# Patient Record
Sex: Female | Born: 1985 | Race: Black or African American | Hispanic: No | Smoking: Never smoker
Health system: Southern US, Community
[De-identification: ages and names within clinical notes are randomized; demographics above are authoritative.]

## PROBLEM LIST (undated history)

## (undated) ENCOUNTER — Inpatient Hospital Stay (HOSPITAL_COMMUNITY): Payer: Self-pay

## (undated) DIAGNOSIS — K219 Gastro-esophageal reflux disease without esophagitis: Secondary | ICD-10-CM

## (undated) DIAGNOSIS — K469 Unspecified abdominal hernia without obstruction or gangrene: Secondary | ICD-10-CM

## (undated) DIAGNOSIS — G43909 Migraine, unspecified, not intractable, without status migrainosus: Secondary | ICD-10-CM

## (undated) DIAGNOSIS — O039 Complete or unspecified spontaneous abortion without complication: Secondary | ICD-10-CM

## (undated) DIAGNOSIS — IMO0002 Reserved for concepts with insufficient information to code with codable children: Secondary | ICD-10-CM

## (undated) DIAGNOSIS — D219 Benign neoplasm of connective and other soft tissue, unspecified: Secondary | ICD-10-CM

## (undated) HISTORY — DX: Reserved for concepts with insufficient information to code with codable children: IMO0002

## (undated) HISTORY — DX: Complete or unspecified spontaneous abortion without complication: O03.9

## (undated) HISTORY — PX: TONSILLECTOMY: SUR1361

---
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
## 2003-06-05 ENCOUNTER — Encounter: Admission: RE | Admit: 2003-06-05 | Discharge: 2003-07-04 | Payer: Self-pay | Admitting: Specialist

## 2007-12-08 ENCOUNTER — Other Ambulatory Visit: Admission: RE | Admit: 2007-12-08 | Discharge: 2007-12-08 | Payer: Self-pay | Admitting: Gynecology

## 2009-05-22 ENCOUNTER — Inpatient Hospital Stay (HOSPITAL_COMMUNITY): Admission: AD | Admit: 2009-05-22 | Discharge: 2009-05-22 | Payer: Self-pay | Admitting: Obstetrics and Gynecology

## 2009-05-27 ENCOUNTER — Inpatient Hospital Stay (HOSPITAL_COMMUNITY): Admission: AD | Admit: 2009-05-27 | Discharge: 2009-05-30 | Payer: Self-pay | Admitting: Obstetrics and Gynecology

## 2010-10-29 LAB — CBC
Hemoglobin: 9.5 g/dL — ABNORMAL LOW (ref 12.0–15.0)
Platelets: 226 10*3/uL (ref 150–400)
RBC: 3.1 MIL/uL — ABNORMAL LOW (ref 3.87–5.11)
RBC: 3.83 MIL/uL — ABNORMAL LOW (ref 3.87–5.11)
RDW: 15.3 % (ref 11.5–15.5)
WBC: 11.7 10*3/uL — ABNORMAL HIGH (ref 4.0–10.5)
WBC: 11.7 10*3/uL — ABNORMAL HIGH (ref 4.0–10.5)

## 2010-10-29 LAB — RPR: RPR Ser Ql: NONREACTIVE

## 2010-12-18 ENCOUNTER — Encounter (INDEPENDENT_AMBULATORY_CARE_PROVIDER_SITE_OTHER): Payer: Medicaid Other

## 2010-12-18 DIAGNOSIS — Z3480 Encounter for supervision of other normal pregnancy, unspecified trimester: Secondary | ICD-10-CM

## 2010-12-26 DIAGNOSIS — O039 Complete or unspecified spontaneous abortion without complication: Secondary | ICD-10-CM

## 2010-12-26 HISTORY — DX: Complete or unspecified spontaneous abortion without complication: O03.9

## 2010-12-29 ENCOUNTER — Ambulatory Visit: Payer: Medicaid Other | Admitting: Obstetrics and Gynecology

## 2010-12-30 ENCOUNTER — Ambulatory Visit: Payer: Medicaid Other | Admitting: Family Medicine

## 2011-01-06 ENCOUNTER — Ambulatory Visit (INDEPENDENT_AMBULATORY_CARE_PROVIDER_SITE_OTHER): Payer: Medicaid Other | Admitting: Family Medicine

## 2011-01-06 ENCOUNTER — Encounter: Payer: Medicaid Other | Admitting: Family Medicine

## 2011-01-06 DIAGNOSIS — O021 Missed abortion: Secondary | ICD-10-CM

## 2011-01-07 NOTE — Assessment & Plan Note (Signed)
Frances Cohen, Frances Cohen              ACCOUNT NO.:  [id number]  MEDICAL RECORD NO.:  [id number]           PATIENT TYPE:  LOCATION:  [facility]           FACILITY:  PHYSICIAN:  Tinnie Gens, MD             DATE OF BIRTH:  DATE OF SERVICE:                                 CLINIC NOTE  CHIEF COMPLAINT:  Followup SAB.  HISTORY OF PRESENT ILLNESS:  The patient is a 25 year old gravida 3, para 1-0-1-1 who had a miscarriage approximately 2 weeks ago.  She reported passing tissue back at home and she had bleeding for 6 days that has resolved.  She has never went to hospital for followup.  She stopped bleeding.  She feels like she has passed everything.  She had an abnormal-appearing sac, but no IUP here in the office.  She reports her mood is well.  She is feeling well.  No other issues.  PHYSICAL EXAMINATION:  VITALS:  As in the chart. GENERAL:  She is well developed and well nourished in no acute distress. ABDOMEN:  Soft, nontender, nondistended.  IMPRESSION:  Status post spontaneous abortion.  PLAN:  I had a lengthy discussion with her regarding causes of the likely outcome to pregnancy at this age, normal rate of miscarriage and normal healthy adult waiting to get pregnant for at least 3 months, and starting on birth control in a patient and Loestrin that she has been on previously.  She will restart this when she gets her cycle back.  If she does get her cycle back, she should start with that.  Resume prenatal vitamins with folic acid prior to achieving pregnancy.          ______________________________ Tinnie Gens, MD    TP/MEDQ  D:  01/06/2011  T:  01/07/2011  Job:  [id number]

## 2011-04-12 ENCOUNTER — Emergency Department: Payer: Self-pay | Admitting: Emergency Medicine

## 2011-04-29 ENCOUNTER — Other Ambulatory Visit (INDEPENDENT_AMBULATORY_CARE_PROVIDER_SITE_OTHER): Payer: Self-pay

## 2011-04-29 DIAGNOSIS — N912 Amenorrhea, unspecified: Secondary | ICD-10-CM

## 2011-04-29 NOTE — Progress Notes (Signed)
Patient is here today for a pregnancy confirmation.  She needs confirmation to be able to apply for insurance. Urine pregnancy test is positive.  Patients LMP is March 12, 2011.  She has a new ob intake appointment next week.

## 2011-05-04 ENCOUNTER — Ambulatory Visit: Payer: 59 | Admitting: Gynecology

## 2011-05-04 DIAGNOSIS — O3680X0 Pregnancy with inconclusive fetal viability, not applicable or unspecified: Secondary | ICD-10-CM

## 2011-05-04 DIAGNOSIS — Z3480 Encounter for supervision of other normal pregnancy, unspecified trimester: Secondary | ICD-10-CM

## 2011-05-04 LAB — OB RESULTS CONSOLE ABO/RH

## 2011-05-04 LAB — OB RESULTS CONSOLE RUBELLA ANTIBODY, IGM: Rubella: IMMUNE

## 2011-05-04 LAB — OB RESULTS CONSOLE ANTIBODY SCREEN: Antibody Screen: NEGATIVE

## 2011-05-04 LAB — OB RESULTS CONSOLE HEPATITIS B SURFACE ANTIGEN: Hepatitis B Surface Ag: UNDETERMINED

## 2011-05-19 ENCOUNTER — Telehealth: Payer: Self-pay | Admitting: *Deleted

## 2011-05-19 MED ORDER — ONDANSETRON HCL 4 MG PO TABS: 4.0000 mg | ORAL_TABLET | Freq: Three times a day (TID) | ORAL | Status: AC | PRN

## 2011-05-19 NOTE — Telephone Encounter (Signed)
Patient complains of increased nausea and vomiting in her first trimester.  She is unable to eat anything.  We will call in Zofran 4mg  for her and she will call back if her symptoms change or worsen.

## 2011-05-20 ENCOUNTER — Ambulatory Visit (HOSPITAL_COMMUNITY): Payer: 59

## 2011-06-09 ENCOUNTER — Ambulatory Visit (INDEPENDENT_AMBULATORY_CARE_PROVIDER_SITE_OTHER): Payer: 59 | Admitting: Obstetrics & Gynecology

## 2011-06-09 ENCOUNTER — Ambulatory Visit (HOSPITAL_COMMUNITY)
Admission: RE | Admit: 2011-06-09 | Discharge: 2011-06-09 | Disposition: A | Discharge status: Home / Self Care | Payer: 59 | Source: Ambulatory Visit | Attending: Obstetrics and Gynecology | Admitting: Obstetrics and Gynecology

## 2011-06-09 VITALS — BP 103/64 | Wt 140.0 lb

## 2011-06-09 DIAGNOSIS — Z9889 Other specified postprocedural states: Secondary | ICD-10-CM

## 2011-06-09 DIAGNOSIS — Z3490 Encounter for supervision of normal pregnancy, unspecified, unspecified trimester: Secondary | ICD-10-CM | POA: Insufficient documentation

## 2011-06-09 DIAGNOSIS — Z3689 Encounter for other specified antenatal screening: Secondary | ICD-10-CM | POA: Insufficient documentation

## 2011-06-09 DIAGNOSIS — Z3480 Encounter for supervision of other normal pregnancy, unspecified trimester: Secondary | ICD-10-CM

## 2011-06-09 DIAGNOSIS — Z98891 History of uterine scar from previous surgery: Secondary | ICD-10-CM | POA: Insufficient documentation

## 2011-06-09 NOTE — Progress Notes (Signed)
   Subjective:    BELICIA DIFATTA is a [id number] [redacted]w[redacted]d being seen today for her first obstetrical visit.  Her obstetrical history is significant for prior cesarean section for failure to progress.  Pregnancy history fully reviewed.  Patient reports no complaints.  Filed Vitals:   06/09/11 1400  BP: 103/64  Weight: 140 lb (63.504 kg)    HISTORY: OB History    Grav Para Term Preterm Abortions TAB SAB Ect Mult Living   4 1 1  2  1   1      # Outc Date GA Lbr Len/2nd Wgt Sex Del Anes PTL Lv   1 ABT 2/08 [redacted]w[redacted]d       No   2 TRM 11/10 [redacted]w[redacted]d  8lb3oz(3.714kg) M LTCS Spinal  Yes   3 SAB 5/12 [redacted]w[redacted]d          4 CUR              Past Medical History  Diagnosis Date  . SAB (spontaneous abortion) 12/2010    TWO  . Abnormal Pap smear 2003   Past Surgical History  Procedure Date  . Tonsillectomy   . Wisdom tooth extraction 2004     x4   Family History  Problem Relation Age of Onset  . Diabetes Mother   . Cancer Paternal Aunt     LUNG  . Cancer Paternal Grandmother 42    breast cancer  . Cancer Paternal Grandfather     prostate cancer     Exam    Uterine Size: 12 cm and size equals dates  Pelvic Exam:    Perineum: No Hemorrhoids   Vulva: normal   Vagina:  normal mucosa, normal discharge   pH: Not done   Cervix: normal   Adnexa: normal adnexa   Bony Pelvis: average  System: Breast:  normal appearance, no masses or tenderness   Skin: normal coloration and turgor, no rashes    Neurologic: normal   Extremities: normal strength, tone, and muscle mass   HEENT PERRLA and extra ocular movement intact   Mouth/Teeth mucous membranes moist, pharynx normal without lesions   Neck supple and no masses   Cardiovascular: regular rate and rhythm   Respiratory:  appears well, vitals normal, no respiratory distress, acyanotic, normal RR, neck free of mass or lymphadenopathy, chest clear, no wheezing, crepitations, rhonchi, normal symmetric air entry   Abdomen: soft, non-tender; bowel  sounds normal; no masses,  no organomegaly   Urinary: urethral meatus normal      Assessment:    Pregnancy: [id number] Patient Active Problem List  Diagnoses  . Supervision of normal pregnancy  . Previous cesarean section        Plan:     Continue prenatal vitamins. Problem list reviewed and updated. Genetic Screening discussed First Screen and Quad Screen: desires quad screen, will draw at next visit.  Ultrasound discussed; fetal survey: ordered.  Follow up in 4 weeks.   ANYANWU,UGONNA A 06/09/2011

## 2011-06-09 NOTE — Patient Instructions (Signed)
Pregnancy - Second Trimester The second trimester of pregnancy (3 to 6 months) is a period of rapid growth for you and your baby. At the end of the sixth month, your baby is about 9 inches long and weighs 1 1/2 pounds. You will begin to feel the baby move between 18 and 20 weeks of the pregnancy. This is called quickening. Weight gain is faster. A clear fluid (colostrum) may leak out of your breasts. You may feel small contractions of the womb (uterus). This is known as false labor or Braxton-Hicks contractions. This is like a practice for labor when the baby is ready to be born. Usually, the problems with morning sickness have usually passed by the end of your first trimester. Some women develop small dark blotches (called cholasma, mask of pregnancy) on their face that usually goes away after the baby is born. Exposure to the sun makes the blotches worse. Acne may also develop in some pregnant women and pregnant women who have acne, may find that it goes away. PRENATAL EXAMS  Blood work may continue to be done during prenatal exams. These tests are done to check on your health and the probable health of your baby. Blood work is used to follow your blood levels (hemoglobin). Anemia (low hemoglobin) is common during pregnancy. Iron and vitamins are given to help prevent this. You will also be checked for diabetes between 24 and 28 weeks of the pregnancy. Some of the previous blood tests may be repeated.   The size of the uterus is measured during each visit. This is to make sure that the baby is continuing to grow properly according to the dates of the pregnancy.   Your blood pressure is checked every prenatal visit. This is to make sure you are not getting toxemia.   Your urine is checked to make sure you do not have an infection, diabetes or protein in the urine.   Your weight is checked often to make sure gains are happening at the suggested rate. This is to ensure that both you and your baby are  growing normally.   Sometimes, an ultrasound is performed to confirm the proper growth and development of the baby. This is a test which bounces harmless sound waves off the baby so your caregiver can more accurately determine due dates.  Sometimes, a specialized test is done on the amniotic fluid surrounding the baby. This test is called an amniocentesis. The amniotic fluid is obtained by sticking a needle into the belly (abdomen). This is done to check the chromosomes in instances where there is a concern about possible genetic problems with the baby. It is also sometimes done near the end of pregnancy if an early delivery is required. In this case, it is done to help make sure the baby's lungs are mature enough for the baby to live outside of the womb. CHANGES OCCURING IN THE SECOND TRIMESTER OF PREGNANCY Your body goes through many changes during pregnancy. They vary from person to person. Talk to your caregiver about changes you notice that you are concerned about.  During the second trimester, you will likely have an increase in your appetite. It is normal to have cravings for certain foods. This varies from person to person and pregnancy to pregnancy.   Your lower abdomen will begin to bulge.   You may have to urinate more often because the uterus and baby are pressing on your bladder. It is also common to get more bladder infections during pregnancy (  pain with urination). You can help this by drinking lots of fluids and emptying your bladder before and after intercourse.   You may begin to get stretch marks on your hips, abdomen, and breasts. These are normal changes in the body during pregnancy. There are no exercises or medications to take that prevent this change.   You may begin to develop swollen and bulging veins (varicose veins) in your legs. Wearing support hose, elevating your feet for 15 minutes, 3 to 4 times a day and limiting salt in your diet helps lessen the problem.    Heartburn may develop as the uterus grows and pushes up against the stomach. Antacids recommended by your caregiver helps with this problem. Also, eating smaller meals 4 to 5 times a day helps.   Constipation can be treated with a stool softener or adding bulk to your diet. Drinking lots of fluids, vegetables, fruits, and whole grains are helpful.   Exercising is also helpful. If you have been very active up until your pregnancy, most of these activities can be continued during your pregnancy. If you have been less active, it is helpful to start an exercise program such as walking.   Hemorrhoids (varicose veins in the rectum) may develop at the end of the second trimester. Warm sitz baths and hemorrhoid cream recommended by your caregiver helps hemorrhoid problems.   Backaches may develop during this time of your pregnancy. Avoid heavy lifting, wear low heal shoes and practice good posture to help with backache problems.   Some pregnant women develop tingling and numbness of their hand and fingers because of swelling and tightening of ligaments in the wrist (carpel tunnel syndrome). This goes away after the baby is born.   As your breasts enlarge, you may have to get a bigger bra. Get a comfortable, cotton, support bra. Do not get a nursing bra until the last month of the pregnancy if you will be nursing the baby.   You may get a dark line from your belly button to the pubic area called the linea nigra.   You may develop rosy cheeks because of increase blood flow to the face.   You may develop spider looking lines of the face, neck, arms and chest. These go away after the baby is born.  HOME CARE INSTRUCTIONS   It is extremely important to avoid all smoking, herbs, alcohol, and unprescribed drugs during your pregnancy. These chemicals affect the formation and growth of the baby. Avoid these chemicals throughout the pregnancy to ensure the delivery of a healthy infant.   Most of your home  care instructions are the same as suggested for the first trimester of your pregnancy. Keep your caregiver's appointments. Follow your caregiver's instructions regarding medication use, exercise and diet.   During pregnancy, you are providing food for you and your baby. Continue to eat regular, well-balanced meals. Choose foods such as meat, fish, milk and other low fat dairy products, vegetables, fruits, and whole-grain breads and cereals. Your caregiver will tell you of the ideal weight gain.   A physical sexual relationship may be continued up until near the end of pregnancy if there are no other problems. Problems could include early (premature) leaking of amniotic fluid from the membranes, vaginal bleeding, abdominal pain, or other medical or pregnancy problems.   Exercise regularly if there are no restrictions. Check with your caregiver if you are unsure of the safety of some of your exercises. The greatest weight gain will occur in the   last 2 trimesters of pregnancy. Exercise will help you:   Control your weight.   Get you in shape for labor and delivery.   Lose weight after you have the baby.   Wear a good support or jogging bra for breast tenderness during pregnancy. This may help if worn during sleep. Pads or tissues may be used in the bra if you are leaking colostrum.   Do not use hot tubs, steam rooms or saunas throughout the pregnancy.   Wear your seat belt at all times when driving. This protects you and your baby if you are in an accident.   Avoid raw meat, uncooked cheese, cat litter boxes and soil used by cats. These carry germs that can cause birth defects in the baby.   The second trimester is also a good time to visit your dentist for your dental health if this has not been done yet. Getting your teeth cleaned is OK. Use a soft toothbrush. Brush gently during pregnancy.   It is easier to loose urine during pregnancy. Tightening up and strengthening the pelvic muscles will  help with this problem. Practice stopping your urination while you are going to the bathroom. These are the same muscles you need to strengthen. It is also the muscles you would use as if you were trying to stop from passing gas. You can practice tightening these muscles up 10 times a set and repeating this about 3 times per day. Once you know what muscles to tighten up, do not perform these exercises during urination. It is more likely to contribute to an infection by backing up the urine.   Ask for help if you have financial, counseling or nutritional needs during pregnancy. Your caregiver will be able to offer counseling for these needs as well as refer you for other special needs.   Your skin may become oily. If so, wash your face with mild soap, use non-greasy moisturizer and oil or cream based makeup.  MEDICATIONS AND DRUG USE IN PREGNANCY  Take prenatal vitamins as directed. The vitamin should contain 1 milligram of folic acid. Keep all vitamins out of reach of children. Only a couple vitamins or tablets containing iron may be fatal to a baby or young child when ingested.   Avoid use of all medications, including herbs, over-the-counter medications, not prescribed or suggested by your caregiver. Only take over-the-counter or prescription medicines for pain, discomfort, or fever as directed by your caregiver. Do not use aspirin.   Let your caregiver also know about herbs you may be using.   Alcohol is related to a number of birth defects. This includes fetal alcohol syndrome. All alcohol, in any form, should be avoided completely. Smoking will cause low birth rate and premature babies.   Street or illegal drugs are very harmful to the baby. They are absolutely forbidden. A baby born to an addicted mother will be addicted at birth. The baby will go through the same withdrawal an adult does.  SEEK MEDICAL CARE IF:  You have any concerns or worries during your pregnancy. It is better to call with  your questions if you feel they cannot wait, rather than worry about them. SEEK IMMEDIATE MEDICAL CARE IF:   An unexplained oral temperature above 102 F (38.9 C) develops, or as your caregiver suggests.   You have leaking of fluid from the vagina (birth canal). If leaking membranes are suspected, take your temperature and tell your caregiver of this when you call.   There   is vaginal spotting, bleeding, or passing clots. Tell your caregiver of the amount and how many pads are used. Light spotting in pregnancy is common, especially following intercourse.   You develop a bad smelling vaginal discharge with a change in the color from clear to white.   You continue to feel sick to your stomach (nauseated) and have no relief from remedies suggested. You vomit blood or coffee ground-like materials.   You lose more than 2 pounds of weight or gain more than 2 pounds of weight over 1 week, or as suggested by your caregiver.   You notice swelling of your face, hands, feet, or legs.   You get exposed to German measles and have never had them.   You are exposed to fifth disease or chickenpox.   You develop belly (abdominal) pain. Round ligament discomfort is a common non-cancerous (benign) cause of abdominal pain in pregnancy. Your caregiver still must evaluate you.   You develop a bad headache that does not go away.   You develop fever, diarrhea, pain with urination, or shortness of breath.   You develop visual problems, blurry, or double vision.   You fall or are in a car accident or any kind of trauma.   There is mental or physical violence at home.  Document Released: 07/07/2001 Document Revised: 03/25/2011 Document Reviewed: 01/09/2009 [employer] Patient Information 2012 [employer]. 

## 2011-07-01 ENCOUNTER — Telehealth: Payer: Self-pay | Admitting: *Deleted

## 2011-07-01 ENCOUNTER — Telehealth: Payer: Self-pay

## 2011-07-01 DIAGNOSIS — O219 Vomiting of pregnancy, unspecified: Secondary | ICD-10-CM

## 2011-07-01 MED ORDER — ONDANSETRON HCL 4 MG PO TABS: 4.0000 mg | ORAL_TABLET | Freq: Three times a day (TID) | ORAL | Status: AC | PRN

## 2011-07-01 MED ORDER — ONDANSETRON HCL 4 MG PO TABS: 4.0000 mg | ORAL_TABLET | Freq: Three times a day (TID) | ORAL | Status: DC | PRN

## 2011-07-01 NOTE — Telephone Encounter (Signed)
Patient needs meds to different pharmacy.

## 2011-07-01 NOTE — Telephone Encounter (Signed)
Left message for patient that her prescription was called in.

## 2011-07-01 NOTE — Telephone Encounter (Signed)
PATIENT IS REALLY SICK AND NEEDS HER PRESCRIPTIONS CALLED IN ZOFRAN TO [facility] PH: [phone].

## 2011-07-07 ENCOUNTER — Ambulatory Visit (INDEPENDENT_AMBULATORY_CARE_PROVIDER_SITE_OTHER): Payer: 59 | Admitting: Obstetrics and Gynecology

## 2011-07-07 VITALS — BP 91/65 | Ht 67.0 in | Wt 142.0 lb

## 2011-07-07 DIAGNOSIS — Z3480 Encounter for supervision of other normal pregnancy, unspecified trimester: Secondary | ICD-10-CM

## 2011-07-07 NOTE — Progress Notes (Signed)
Patient is here today for routine prenatal check.  She is doing well, only having to take the Zofran once daily to control her nausea and vomiting.  Her appetite has returned.

## 2011-07-07 NOTE — Progress Notes (Signed)
Doing well; working FT at [employer]. Discussed prior C/S birth after failed VAVD with 8# baby. Gave VBAC consent and will discuss more next visit but probably wanting Rpt C/S. Has [place] scheduled.

## 2011-07-07 NOTE — Patient Instructions (Signed)
Vaginal Birth After Cesarean Delivery Vaginal birth after Cesarean delivery (VBAC) is giving birth vaginally after previously delivering a baby by a cesarean. In the past, if a woman had a Cesarean delivery, all births afterwards would be done by Cesarean delivery. This is no longer true. It can be safe for the mother to try a vaginal delivery after having a Cesarean. The final decision to have a VBAC or repeat Cesarean delivery should be between the patient and her caregiver. The risks and benefits can be discussed relative to the reason for, and the type of the previous Cesarean delivery. WOMEN WHO PLAN TO HAVE A VBAC SHOULD CHECK WITH THEIR DOCTOR TO BE SURE THAT:  The previous Cesarean was done with a low transverse uterine incision (not a vertical classical incision).   The birth canal is big enough for the baby.   There were no other operations on the uterus.   They will have an electronic fetal monitor (EFM) on at all times during labor.   An operating room would be available and ready in case an emergency Cesarean is needed.   A doctor and surgical nursing staff would be available at all times during labor to be ready to do an emergency Cesarean if necessary.   An anesthesiologist would be present in case an emergency Cesarean is needed.   The nursery is prepared and has adequate personnel and necessary equipment available to care for the baby in case of an emergency Cesarean.  BENEFITS OF VBAC:  Shorter stay in the hospital.   Lower delivery, nursery and hospital costs.   Less blood loss and need for blood transfusions.   Less fever and discomfort from major surgery.   Lower risk of blood clots.   Lower risk of infection.   Shorter recovery after going home.   Lower risk of other surgical complications, such as opening of the incision or hernia in the incision.   Decreased risk of injury to other organs.   Decreased risk for having to remove the uterus (hysterectomy).     Decreased risk for the placenta to completely or partially cover the opening of the uterus (placenta previa) with a future pregnancy.   Ability to have a larger family if desired.  RISKS OF A VBAC:  Rupture of the uterus.   Having to remove the uterus (hysterectomy) if it ruptures.   All the complications of major surgery and/or injury to other organs.   Excessive bleeding, blood clots and infection.   Lower Apgar scores (method to evaluate the newborn based on appearance, pulse, grimace, activity, and respiration) and more risks to the baby.   There is a higher risk of uterine rupture if you induce or augment labor.   There is a higher risk of uterine rupture if you use medications to ripen the cervix.  VBAC SHOULD NOT BE DONE IF:  The previous Cesarean was done with a vertical (classical) or T-shaped incision, or you do not know what kind of an incision was made.   You had a ruptured uterus.   You had surgery on your uterus.   You have medical or obstetrical problems.   There are problems with the baby.   There were two previous Cesarean deliveries and no vaginal deliveries.  OTHER FACTS TO KNOW ABOUT VBAC:  It is safe to have an epidural anesthetic with VBAC.   It is safe to turn the baby from a breech position (attempt an external cephalic version).   It is  safe to try a VBAC with twins.   Pregnancies later than 40 weeks have not been successful with VBAC.   There is an increased failure rate of a VBAC in obese pregnant women.   There is an increased failure rate with VABC if the baby weighs 8.8 pounds (4000 grams) or more.   There is an increased failure rate if the time between the Cesarean and VBAC is less than 19 months.   There is an increased failure rate if pre-eclampsia is present (high blood pressure, protein in the urine and swelling of face and extremities).   VBAC is very successful if there was a previous vaginal birth.   VBAC is very successful  when the labor starts spontaneously before the due date.   Delivery of VBAC is similar to having a normal spontaneous vaginal delivery.  It is important to discuss VBAC with your caregiver early in the pregnancy so you can understand the risks, benefits and options. It will give you time to decide what is best in your particular case relevant to the reason for your previous Cesarean delivery. It should be understood that medical changes in the mother or pregnancy may occur during the pregnancy, which make it necessary to change you or your caregiver's initial decision. The counseling, concerns and decisions should be documented in the medical record and signed by all parties. Document Released: 01/03/2007 Document Revised: 03/25/2011 Document Reviewed: 08/24/2008 [facility] Patient Information 2012 [place], [place].Pregnancy - Second Trimester The second trimester of pregnancy (3 to 6 months) is a period of rapid growth for you and your baby. At the end of the sixth month, your baby is about 9 inches long and weighs 1 1/2 pounds. You will begin to feel the baby move between 18 and 20 weeks of the pregnancy. This is called quickening. Weight gain is faster. A clear fluid (colostrum) may leak out of your breasts. You may feel small contractions of the womb (uterus). This is known as false labor or Braxton-Hicks contractions. This is like a practice for labor when the baby is ready to be born. Usually, the problems with morning sickness have usually passed by the end of your first trimester. Some women develop small dark blotches (called cholasma, mask of pregnancy) on their face that usually goes away after the baby is born. Exposure to the sun makes the blotches worse. Acne may also develop in some pregnant women and pregnant women who have acne, may find that it goes away. PRENATAL EXAMS  Blood work may continue to be done during prenatal exams. These tests are done to check on your health and the probable  health of your baby. Blood work is used to follow your blood levels (hemoglobin). Anemia (low hemoglobin) is common during pregnancy. Iron and vitamins are given to help prevent this. You will also be checked for diabetes between 24 and 28 weeks of the pregnancy. Some of the previous blood tests may be repeated.   The size of the uterus is measured during each visit. This is to make sure that the baby is continuing to grow properly according to the dates of the pregnancy.   Your blood pressure is checked every prenatal visit. This is to make sure you are not getting toxemia.   Your urine is checked to make sure you do not have an infection, diabetes or protein in the urine.   Your weight is checked often to make sure gains are happening at the suggested rate. This is to  ensure that both you and your baby are growing normally.   Sometimes, an ultrasound is performed to confirm the proper growth and development of the baby. This is a test which bounces harmless sound waves off the baby so your caregiver can more accurately determine due dates.  Sometimes, a specialized test is done on the amniotic fluid surrounding the baby. This test is called an amniocentesis. The amniotic fluid is obtained by sticking a needle into the belly (abdomen). This is done to check the chromosomes in instances where there is a concern about possible genetic problems with the baby. It is also sometimes done near the end of pregnancy if an early delivery is required. In this case, it is done to help make sure the baby's lungs are mature enough for the baby to live outside of the womb. CHANGES OCCURING IN THE SECOND TRIMESTER OF PREGNANCY Your body goes through many changes during pregnancy. They vary from person to person. Talk to your caregiver about changes you notice that you are concerned about.  During the second trimester, you will likely have an increase in your appetite. It is normal to have cravings for certain foods.  This varies from person to person and pregnancy to pregnancy.   Your lower abdomen will begin to bulge.   You may have to urinate more often because the uterus and baby are pressing on your bladder. It is also common to get more bladder infections during pregnancy (pain with urination). You can help this by drinking lots of fluids and emptying your bladder before and after intercourse.   You may begin to get stretch marks on your hips, abdomen, and breasts. These are normal changes in the body during pregnancy. There are no exercises or medications to take that prevent this change.   You may begin to develop swollen and bulging veins (varicose veins) in your legs. Wearing support hose, elevating your feet for 15 minutes, 3 to 4 times a day and limiting salt in your diet helps lessen the problem.   Heartburn may develop as the uterus grows and pushes up against the stomach. Antacids recommended by your caregiver helps with this problem. Also, eating smaller meals 4 to 5 times a day helps.   Constipation can be treated with a stool softener or adding bulk to your diet. Drinking lots of fluids, vegetables, fruits, and whole grains are helpful.   Exercising is also helpful. If you have been very active up until your pregnancy, most of these activities can be continued during your pregnancy. If you have been less active, it is helpful to start an exercise program such as walking.   Hemorrhoids (varicose veins in the rectum) may develop at the end of the second trimester. Warm sitz baths and hemorrhoid cream recommended by your caregiver helps hemorrhoid problems.   Backaches may develop during this time of your pregnancy. Avoid heavy lifting, wear low heal shoes and practice good posture to help with backache problems.   Some pregnant women develop tingling and numbness of their hand and fingers because of swelling and tightening of ligaments in the wrist (carpel tunnel syndrome). This goes away after  the baby is born.   As your breasts enlarge, you may have to get a bigger bra. Get a comfortable, cotton, support bra. Do not get a nursing bra until the last month of the pregnancy if you will be nursing the baby.   You may get a dark line from your belly button to the  pubic area called the linea nigra.   You may develop rosy cheeks because of increase blood flow to the face.   You may develop spider looking lines of the face, neck, arms and chest. These go away after the baby is born.  HOME CARE INSTRUCTIONS   It is extremely important to avoid all smoking, herbs, alcohol, and unprescribed drugs during your pregnancy. These chemicals affect the formation and growth of the baby. Avoid these chemicals throughout the pregnancy to ensure the delivery of a healthy infant.   Most of your home care instructions are the same as suggested for the first trimester of your pregnancy. Keep your caregiver's appointments. Follow your caregiver's instructions regarding medication use, exercise and diet.   During pregnancy, you are providing food for you and your baby. Continue to eat regular, well-balanced meals. Choose foods such as meat, fish, milk and other low fat dairy products, vegetables, fruits, and whole-grain breads and cereals. Your caregiver will tell you of the ideal weight gain.   A physical sexual relationship may be continued up until near the end of pregnancy if there are no other problems. Problems could include early (premature) leaking of amniotic fluid from the membranes, vaginal bleeding, abdominal pain, or other medical or pregnancy problems.   Exercise regularly if there are no restrictions. Check with your caregiver if you are unsure of the safety of some of your exercises. The greatest weight gain will occur in the last 2 trimesters of pregnancy. Exercise will help you:   Control your weight.   Get you in shape for labor and delivery.   Lose weight after you have the baby.    Wear a good support or jogging bra for breast tenderness during pregnancy. This may help if worn during sleep. Pads or tissues may be used in the bra if you are leaking colostrum.   Do not use hot tubs, steam rooms or saunas throughout the pregnancy.   Wear your seat belt at all times when driving. This protects you and your baby if you are in an accident.   Avoid raw meat, uncooked cheese, cat litter boxes and soil used by cats. These carry germs that can cause birth defects in the baby.   The second trimester is also a good time to visit your dentist for your dental health if this has not been done yet. Getting your teeth cleaned is OK. Use a soft toothbrush. Brush gently during pregnancy.   It is easier to loose urine during pregnancy. Tightening up and strengthening the pelvic muscles will help with this problem. Practice stopping your urination while you are going to the bathroom. These are the same muscles you need to strengthen. It is also the muscles you would use as if you were trying to stop from passing gas. You can practice tightening these muscles up 10 times a set and repeating this about 3 times per day. Once you know what muscles to tighten up, do not perform these exercises during urination. It is more likely to contribute to an infection by backing up the urine.   Ask for help if you have financial, counseling or nutritional needs during pregnancy. Your caregiver will be able to offer counseling for these needs as well as refer you for other special needs.   Your skin may become oily. If so, wash your face with mild soap, use non-greasy moisturizer and oil or cream based makeup.  MEDICATIONS AND DRUG USE IN PREGNANCY  Take prenatal vitamins as  directed. The vitamin should contain 1 milligram of folic acid. Keep all vitamins out of reach of children. Only a couple vitamins or tablets containing iron may be fatal to a baby or young child when ingested.   Avoid use of all  medications, including herbs, over-the-counter medications, not prescribed or suggested by your caregiver. Only take over-the-counter or prescription medicines for pain, discomfort, or fever as directed by your caregiver. Do not use aspirin.   Let your caregiver also know about herbs you may be using.   Alcohol is related to a number of birth defects. This includes fetal alcohol syndrome. All alcohol, in any form, should be avoided completely. Smoking will cause low birth rate and premature babies.   Street or illegal drugs are very harmful to the baby. They are absolutely forbidden. A baby born to an addicted mother will be addicted at birth. The baby will go through the same withdrawal an adult does.  SEEK MEDICAL CARE IF:  You have any concerns or worries during your pregnancy. It is better to call with your questions if you feel they cannot wait, rather than worry about them. SEEK IMMEDIATE MEDICAL CARE IF:   An unexplained oral temperature above 102 F (38.9 C) develops, or as your caregiver suggests.   You have leaking of fluid from the vagina (birth canal). If leaking membranes are suspected, take your temperature and tell your caregiver of this when you call.   There is vaginal spotting, bleeding, or passing clots. Tell your caregiver of the amount and how many pads are used. Light spotting in pregnancy is common, especially following intercourse.   You develop a bad smelling vaginal discharge with a change in the color from clear to white.   You continue to feel sick to your stomach (nauseated) and have no relief from remedies suggested. You vomit blood or coffee ground-like materials.   You lose more than 2 pounds of weight or gain more than 2 pounds of weight over 1 week, or as suggested by your caregiver.   You notice swelling of your face, hands, feet, or legs.   You get exposed to [place] measles and have never had them.   You are exposed to fifth disease or chickenpox.    You develop belly (abdominal) pain. Round ligament discomfort is a common non-cancerous (benign) cause of abdominal pain in pregnancy. Your caregiver still must evaluate you.   You develop a bad headache that does not go away.   You develop fever, diarrhea, pain with urination, or shortness of breath.   You develop visual problems, blurry, or double vision.   You fall or are in a car accident or any kind of trauma.   There is mental or physical violence at home.  Document Released: 07/07/2001 Document Revised: 03/25/2011 Document Reviewed: 01/09/2009 [facility] Patient Information 2012 [place], [place].

## 2011-07-22 ENCOUNTER — Ambulatory Visit (HOSPITAL_COMMUNITY)
Admission: RE | Admit: 2011-07-22 | Discharge: 2011-07-22 | Disposition: A | Discharge status: Home / Self Care | Payer: 59 | Source: Ambulatory Visit | Attending: Obstetrics & Gynecology | Admitting: Obstetrics & Gynecology

## 2011-07-22 ENCOUNTER — Encounter: Payer: Self-pay | Admitting: Obstetrics & Gynecology

## 2011-07-22 DIAGNOSIS — Z3490 Encounter for supervision of normal pregnancy, unspecified, unspecified trimester: Secondary | ICD-10-CM

## 2011-07-22 DIAGNOSIS — O358XX0 Maternal care for other (suspected) fetal abnormality and damage, not applicable or unspecified: Secondary | ICD-10-CM | POA: Insufficient documentation

## 2011-07-22 DIAGNOSIS — O34219 Maternal care for unspecified type scar from previous cesarean delivery: Secondary | ICD-10-CM | POA: Insufficient documentation

## 2011-07-22 DIAGNOSIS — Z363 Encounter for antenatal screening for malformations: Secondary | ICD-10-CM | POA: Insufficient documentation

## 2011-07-22 DIAGNOSIS — Z1389 Encounter for screening for other disorder: Secondary | ICD-10-CM | POA: Insufficient documentation

## 2011-07-22 NOTE — Progress Notes (Signed)
Will do on next visit.

## 2011-07-30 ENCOUNTER — Ambulatory Visit (HOSPITAL_COMMUNITY): Payer: 59

## 2011-08-05 ENCOUNTER — Other Ambulatory Visit: Payer: Self-pay | Admitting: Obstetrics and Gynecology

## 2011-08-05 ENCOUNTER — Ambulatory Visit (INDEPENDENT_AMBULATORY_CARE_PROVIDER_SITE_OTHER): Payer: 59 | Admitting: Obstetrics and Gynecology

## 2011-08-05 DIAGNOSIS — Z9889 Other specified postprocedural states: Secondary | ICD-10-CM

## 2011-08-05 DIAGNOSIS — Z3490 Encounter for supervision of normal pregnancy, unspecified, unspecified trimester: Secondary | ICD-10-CM

## 2011-08-05 DIAGNOSIS — Z98891 History of uterine scar from previous surgery: Secondary | ICD-10-CM

## 2011-08-05 DIAGNOSIS — Z3480 Encounter for supervision of other normal pregnancy, unspecified trimester: Secondary | ICD-10-CM

## 2011-08-05 NOTE — Progress Notes (Signed)
Patient doing well without complaints. Patient never had quad screen collected.

## 2011-08-10 ENCOUNTER — Telehealth: Payer: Self-pay | Admitting: *Deleted

## 2011-08-11 LAB — AFP, QUAD SCREEN
AFP: 115 IU/mL
Age Alone: 1:899 {titer}
Curr Gest Age: 20.6 wks.days
Down Syndrome Scr Risk Est: 1:15300 {titer}
HCG, Total: 19411 m[IU]/mL
INH: 222.7 pg/mL
Interpretation-AFP: NEGATIVE
MoM for AFP: 1.73
MoM for INH: 1.29
MoM for hCG: 1.47
Open Spina bifida: NEGATIVE
Osb Risk: 1:3000 {titer}
Tri 18 Scr Risk Est: NEGATIVE
Trisomy 18 (Edward) Syndrome Interp.: 1:95100 {titer}
uE3 Mom: 0.83
uE3 Value: 1.3 ng/mL

## 2011-08-12 LAB — ALPHA FETOPROTEIN, MATERNAL
AFP: 101 IU/mL
Open Spina bifida: NEGATIVE
Osb Risk: 1:5330 {titer}

## 2011-08-13 ENCOUNTER — Encounter: Payer: Self-pay | Admitting: Obstetrics and Gynecology

## 2011-08-20 ENCOUNTER — Ambulatory Visit (INDEPENDENT_AMBULATORY_CARE_PROVIDER_SITE_OTHER): Payer: 59 | Admitting: Obstetrics & Gynecology

## 2011-08-20 VITALS — BP 105/63 | Wt 152.0 lb

## 2011-08-20 DIAGNOSIS — Z3490 Encounter for supervision of normal pregnancy, unspecified, unspecified trimester: Secondary | ICD-10-CM

## 2011-08-20 DIAGNOSIS — Z3480 Encounter for supervision of other normal pregnancy, unspecified trimester: Secondary | ICD-10-CM

## 2011-08-20 LAB — POCT URINALYSIS DIPSTICK
Ketones, UA: NEGATIVE
Leukocytes, UA: NEGATIVE
Urobilinogen, UA: 0.2
pH, UA: 7.5

## 2011-08-20 NOTE — Progress Notes (Signed)
She is here today for an unscheduled visit because she had what sound like BH ctxs yesterday. They have resolved today. She denies VB or ROM and reports good FM. I have given her reassurance.

## 2011-08-27 NOTE — Telephone Encounter (Signed)
Patient needs a letter to assist with not having to park so far away from the entrance to her work. They have changed her hours to evening hours.

## 2011-09-03 ENCOUNTER — Encounter: Payer: 59 | Admitting: Obstetrics & Gynecology

## 2011-09-08 ENCOUNTER — Encounter: Payer: 59 | Admitting: Family Medicine

## 2011-09-09 ENCOUNTER — Encounter: Payer: 59 | Admitting: Obstetrics & Gynecology

## 2011-09-09 ENCOUNTER — Ambulatory Visit (INDEPENDENT_AMBULATORY_CARE_PROVIDER_SITE_OTHER): Payer: 59 | Admitting: Obstetrics & Gynecology

## 2011-09-09 VITALS — BP 102/66 | Wt 153.0 lb

## 2011-09-09 DIAGNOSIS — Z3480 Encounter for supervision of other normal pregnancy, unspecified trimester: Secondary | ICD-10-CM

## 2011-09-09 DIAGNOSIS — Z3490 Encounter for supervision of normal pregnancy, unspecified, unspecified trimester: Secondary | ICD-10-CM

## 2011-09-09 NOTE — Progress Notes (Signed)
Routine visit. No problems now (a cold has resolved). She reports good FM, denies VB, ROM, CTXs. She has already had her flu shot with this pregnancy. She will need a 1 hour glucola and labs at next visit.

## 2011-09-15 ENCOUNTER — Ambulatory Visit (INDEPENDENT_AMBULATORY_CARE_PROVIDER_SITE_OTHER): Payer: 59 | Admitting: Obstetrics and Gynecology

## 2011-09-15 DIAGNOSIS — R002 Palpitations: Secondary | ICD-10-CM

## 2011-09-15 DIAGNOSIS — R0602 Shortness of breath: Secondary | ICD-10-CM

## 2011-09-15 DIAGNOSIS — Z3480 Encounter for supervision of other normal pregnancy, unspecified trimester: Secondary | ICD-10-CM

## 2011-09-15 DIAGNOSIS — Z3490 Encounter for supervision of normal pregnancy, unspecified, unspecified trimester: Secondary | ICD-10-CM

## 2011-09-15 NOTE — Progress Notes (Signed)
Patient presents today c/o heart palpitation and SOB over the past few weeks. Patient states that it happens a few times a day, every day, at rest and with activity. She has been sleeping with several pillows for several weeks (before the onset of these symptoms) to alleviate back pain and GERD. On physical exam, normal heart sounds are heard and Lungs clear to auscultation bilaterally. Will verify a TSH and refer to cardiology. Patient to follow-up as scheduled for routine ob and 2nd trimester blood work

## 2011-09-25 DIAGNOSIS — K469 Unspecified abdominal hernia without obstruction or gangrene: Secondary | ICD-10-CM

## 2011-09-25 HISTORY — DX: Unspecified abdominal hernia without obstruction or gangrene: K46.9

## 2011-09-29 ENCOUNTER — Encounter: Payer: Self-pay | Admitting: *Deleted

## 2011-09-30 ENCOUNTER — Ambulatory Visit (INDEPENDENT_AMBULATORY_CARE_PROVIDER_SITE_OTHER): Payer: 59 | Admitting: Obstetrics & Gynecology

## 2011-09-30 ENCOUNTER — Institutional Professional Consult (permissible substitution): Payer: 59 | Admitting: Cardiology

## 2011-09-30 DIAGNOSIS — O2390 Unspecified genitourinary tract infection in pregnancy, unspecified trimester: Secondary | ICD-10-CM

## 2011-09-30 DIAGNOSIS — Z3480 Encounter for supervision of other normal pregnancy, unspecified trimester: Secondary | ICD-10-CM

## 2011-09-30 DIAGNOSIS — Z9889 Other specified postprocedural states: Secondary | ICD-10-CM

## 2011-09-30 DIAGNOSIS — N390 Urinary tract infection, site not specified: Secondary | ICD-10-CM

## 2011-09-30 DIAGNOSIS — Z98891 History of uterine scar from previous surgery: Secondary | ICD-10-CM

## 2011-09-30 DIAGNOSIS — Z3490 Encounter for supervision of normal pregnancy, unspecified, unspecified trimester: Secondary | ICD-10-CM

## 2011-09-30 MED ORDER — FLUCONAZOLE 150 MG PO TABS: 150.0000 mg | ORAL_TABLET | Freq: Once | ORAL | Status: AC

## 2011-09-30 NOTE — Patient Instructions (Addendum)

## 2011-09-30 NOTE — Progress Notes (Signed)
TSH normal, patient denies any further heart palpitations, CP or SOB.  She does not want to go to cardiology any more, wants the referral cancelled.   Complains of itching and white vaginal discharge; exam showed thick, white vaginal discharge.  No lesions seen.  Likely yeast infection, Diflucan e-prescribed.  Discussed TOLAC vs RCS; risks reviewed.  Wants TOLAC, consent reviewed and signed.  Plans to breast feed, Depo Provera for BCM.  1 hr GTT and third trimester labs.  No other complaints or concerns.  Fetal movement and labor precautions reviewed.

## 2011-10-01 LAB — WET PREP, GENITAL
Trich, Wet Prep: NONE SEEN
Yeast Wet Prep HPF POC: NONE SEEN

## 2011-10-08 ENCOUNTER — Other Ambulatory Visit: Payer: Self-pay | Admitting: Obstetrics and Gynecology

## 2011-10-08 NOTE — Progress Notes (Signed)
Darl Pikes called patient. Message left on vml to call back office.

## 2011-10-09 ENCOUNTER — Encounter: Payer: Self-pay | Admitting: Cardiology

## 2011-10-13 ENCOUNTER — Encounter: Payer: Self-pay | Admitting: Family Medicine

## 2011-10-13 ENCOUNTER — Ambulatory Visit (INDEPENDENT_AMBULATORY_CARE_PROVIDER_SITE_OTHER): Payer: 59 | Admitting: Family Medicine

## 2011-10-13 VITALS — BP 91/66 | Wt 158.0 lb

## 2011-10-13 DIAGNOSIS — R7309 Other abnormal glucose: Secondary | ICD-10-CM

## 2011-10-13 DIAGNOSIS — Z3480 Encounter for supervision of other normal pregnancy, unspecified trimester: Secondary | ICD-10-CM

## 2011-10-13 DIAGNOSIS — K4190 Unilateral femoral hernia, without obstruction or gangrene, not specified as recurrent: Secondary | ICD-10-CM | POA: Insufficient documentation

## 2011-10-13 DIAGNOSIS — Z3490 Encounter for supervision of normal pregnancy, unspecified, unspecified trimester: Secondary | ICD-10-CM

## 2011-10-13 NOTE — Progress Notes (Signed)
Small femoral hernia noted on her left side.  Soft and reducible. Surgery referral pp.  Discussed TOLAC again.  Risks?benefits reviewed.

## 2011-10-13 NOTE — Progress Notes (Signed)
Addended by: Barbara Cower on: 10/13/2011 01:20 PM   Modules accepted: Orders

## 2011-10-13 NOTE — Progress Notes (Signed)
Here for 3 hr gtt and routine ob check.  She has a "bulge" at the top of her pubic area that she would like you to check out.

## 2011-10-13 NOTE — Patient Instructions (Addendum)
Hernia A hernia occurs when an internal organ pushes out through a weak spot in the abdominal wall. Hernias most commonly occur in the groin and around the navel. Hernias often can be pushed back into place (reduced). Most hernias tend to get worse over time. Some abdominal hernias can get stuck in the opening (irreducible or incarcerated hernia) and cannot be reduced. An irreducible abdominal hernia which is tightly squeezed into the opening is at risk for impaired blood supply (strangulated hernia). A strangulated hernia is a medical emergency. Because of the risk for an irreducible or strangulated hernia, surgery may be recommended to repair a hernia. CAUSES   Heavy lifting.   Prolonged coughing.   Straining to have a bowel movement.   A cut (incision) made during an abdominal surgery.  HOME CARE INSTRUCTIONS   Bed rest is not required. You may continue your normal activities.   Avoid lifting more than 10 pounds (4.5 kg) or straining.   Cough gently. If you are a smoker it is best to stop. Even the best hernia repair can break down with the continual strain of coughing. Even if you do not have your hernia repaired, a cough will continue to aggravate the problem.   Do not wear anything tight over your hernia. Do not try to keep it in with an outside bandage or truss. These can damage abdominal contents if they are trapped within the hernia sac.   Eat a normal diet.   Avoid constipation. Straining over long periods of time will increase hernia size and encourage breakdown of repairs. If you cannot do this with diet alone, stool softeners may be used.  SEEK IMMEDIATE MEDICAL CARE IF:   You have a fever.   You develop increasing abdominal pain.   You feel nauseous or vomit.   Your hernia is stuck outside the abdomen, looks discolored, feels hard, or is tender.   You have any changes in your bowel habits or in the hernia that are unusual for you.   You have increased pain or  swelling around the hernia.   You cannot push the hernia back in place by applying gentle pressure while lying down.  MAKE SURE YOU:   Understand these instructions.   Will watch your condition.   Will get help right away if you are not doing well or get worse.  Document Released: 07/13/2005 Document Revised: 07/02/2011 Document Reviewed: 03/01/2008 [facility] Patient Information 2012 [place], [place]. Pregnancy - Third Trimester The third trimester of pregnancy (the last 3 months) is a period of the most rapid growth for you and your baby. The baby approaches a length of 20 inches and a weight of 6 to 10 pounds. The baby is adding on fat and getting ready for life outside your body. While inside, babies have periods of sleeping and waking, suck their thumbs, and hiccups. You can often feel small contractions of the uterus. This is false labor. It is also called Braxton-Hicks contractions. This is like a practice for labor. The usual problems in this stage of pregnancy include more difficulty breathing, swelling of the hands and feet from water retention, and having to urinate more often because of the uterus and baby pressing on your bladder.  PRENATAL EXAMS  Blood work may continue to be done during prenatal exams. These tests are done to check on your health and the probable health of your baby. Blood work is used to follow your blood levels (hemoglobin). Anemia (low hemoglobin) is common during pregnancy.  Iron and vitamins are given to help prevent this. You may also continue to be checked for diabetes. Some of the past blood tests may be done again.   The size of the uterus is measured during each visit. This makes sure your baby is growing properly according to your pregnancy dates.   Your blood pressure is checked every prenatal visit. This is to make sure you are not getting toxemia.   Your urine is checked every prenatal visit for infection, diabetes and protein.   Your weight is  checked at each visit. This is done to make sure gains are happening at the suggested rate and that you and your baby are growing normally.   Sometimes, an ultrasound is performed to confirm the position and the proper growth and development of the baby. This is a test done that bounces harmless sound waves off the baby so your caregiver can more accurately determine due dates.   Discuss the type of pain medication and anesthesia you will have during your labor and delivery.   Discuss the possibility and anesthesia if a Cesarean Section might be necessary.   Inform your caregiver if there is any mental or physical violence at home.  Sometimes, a specialized non-stress test, contraction stress test and biophysical profile are done to make sure the baby is not having a problem. Checking the amniotic fluid surrounding the baby is called an amniocentesis. The amniotic fluid is removed by sticking a needle into the belly (abdomen). This is sometimes done near the end of pregnancy if an early delivery is required. In this case, it is done to help make sure the baby's lungs are mature enough for the baby to live outside of the womb. If the lungs are not mature and it is unsafe to deliver the baby, an injection of cortisone medication is given to the mother 1 to 2 days before the delivery. This helps the baby's lungs mature and makes it safer to deliver the baby. CHANGES OCCURING IN THE THIRD TRIMESTER OF PREGNANCY Your body goes through many changes during pregnancy. They vary from person to person. Talk to your caregiver about changes you notice and are concerned about.  During the last trimester, you have probably had an increase in your appetite. It is normal to have cravings for certain foods. This varies from person to person and pregnancy to pregnancy.   You may begin to get stretch marks on your hips, abdomen, and breasts. These are normal changes in the body during pregnancy. There are no exercises  or medications to take which prevent this change.   Constipation may be treated with a stool softener or adding bulk to your diet. Drinking lots of fluids, fiber in vegetables, fruits, and whole grains are helpful.   Exercising is also helpful. If you have been very active up until your pregnancy, most of these activities can be continued during your pregnancy. If you have been less active, it is helpful to start an exercise program such as walking. Consult your caregiver before starting exercise programs.   Avoid all smoking, alcohol, un-prescribed drugs, herbs and "street drugs" during your pregnancy. These chemicals affect the formation and growth of the baby. Avoid chemicals throughout the pregnancy to ensure the delivery of a healthy infant.   Backache, varicose veins and hemorrhoids may develop or get worse.   You will tire more easily in the third trimester, which is normal.   The baby's movements may be stronger and more often.  You may become short of breath easily.   Your belly button may stick out.   A yellow discharge may leak from your breasts called colostrum.   You may have a bloody mucus discharge. This usually occurs a few days to a week before labor begins.  HOME CARE INSTRUCTIONS   Keep your caregiver's appointments. Follow your caregiver's instructions regarding medication use, exercise, and diet.   During pregnancy, you are providing food for you and your baby. Continue to eat regular, well-balanced meals. Choose foods such as meat, fish, milk and other low fat dairy products, vegetables, fruits, and whole-grain breads and cereals. Your caregiver will tell you of the ideal weight gain.   A physical sexual relationship may be continued throughout pregnancy if there are no other problems such as early (premature) leaking of amniotic fluid from the membranes, vaginal bleeding, or belly (abdominal) pain.   Exercise regularly if there are no restrictions. Check with your  caregiver if you are unsure of the safety of your exercises. Greater weight gain will occur in the last 2 trimesters of pregnancy. Exercising helps:   Control your weight.   Get you in shape for labor and delivery.   You lose weight after you deliver.   Rest a lot with legs elevated, or as needed for leg cramps or low back pain.   Wear a good support or jogging bra for breast tenderness during pregnancy. This may help if worn during sleep. Pads or tissues may be used in the bra if you are leaking colostrum.   Do not use hot tubs, steam rooms, or saunas.   Wear your seat belt when driving. This protects you and your baby if you are in an accident.   Avoid raw meat, cat litter boxes and soil used by cats. These carry germs that can cause birth defects in the baby.   It is easier to loose urine during pregnancy. Tightening up and strengthening the pelvic muscles will help with this problem. You can practice stopping your urination while you are going to the bathroom. These are the same muscles you need to strengthen. It is also the muscles you would use if you were trying to stop from passing gas. You can practice tightening these muscles up 10 times a set and repeating this about 3 times per day. Once you know what muscles to tighten up, do not perform these exercises during urination. It is more likely to cause an infection by backing up the urine.   Ask for help if you have financial, counseling or nutritional needs during pregnancy. Your caregiver will be able to offer counseling for these needs as well as refer you for other special needs.   Make a list of emergency phone numbers and have them available.   Plan on getting help from family or friends when you go home from the hospital.   Make a trial run to the hospital.   Take prenatal classes with the father to understand, practice and ask questions about the labor and delivery.   Prepare the baby's room/nursery.   Do not travel  out of the city unless it is absolutely necessary and with the advice of your caregiver.   Wear only low or no heal shoes to have better balance and prevent falling.  MEDICATIONS AND DRUG USE IN PREGNANCY  Take prenatal vitamins as directed. The vitamin should contain 1 milligram of folic acid. Keep all vitamins out of reach of children. Only a couple vitamins or  tablets containing iron may be fatal to a baby or young child when ingested.   Avoid use of all medications, including herbs, over-the-counter medications, not prescribed or suggested by your caregiver. Only take over-the-counter or prescription medicines for pain, discomfort, or fever as directed by your caregiver. Do not use aspirin, ibuprofen (Motrin, Advil, Nuprin) or naproxen (Aleve) unless OK'd by your caregiver.   Let your caregiver also know about herbs you may be using.   Alcohol is related to a number of birth defects. This includes fetal alcohol syndrome. All alcohol, in any form, should be avoided completely. Smoking will cause low birth rate and premature babies.   Street/illegal drugs are very harmful to the baby. They are absolutely forbidden. A baby born to an addicted mother will be addicted at birth. The baby will go through the same withdrawal an adult does.  SEEK MEDICAL CARE IF: You have any concerns or worries during your pregnancy. It is better to call with your questions if you feel they cannot wait, rather than worry about them. DECISIONS ABOUT CIRCUMCISION You may or may not know the sex of your baby. If you know your baby is a boy, it may be time to think about circumcision. Circumcision is the removal of the foreskin of the penis. This is the skin that covers the sensitive end of the penis. There is no proven medical need for this. Often this decision is made on what is popular at the time or based upon religious beliefs and social issues. You can discuss these issues with your caregiver or  pediatrician. SEEK IMMEDIATE MEDICAL CARE IF:   An unexplained oral temperature above 102 F (38.9 C) develops, or as your caregiver suggests.   You have leaking of fluid from the vagina (birth canal). If leaking membranes are suspected, take your temperature and tell your caregiver of this when you call.   There is vaginal spotting, bleeding or passing clots. Tell your caregiver of the amount and how many pads are used.   You develop a bad smelling vaginal discharge with a change in the color from clear to white.   You develop vomiting that lasts more than 24 hours.   You develop chills or fever.   You develop shortness of breath.   You develop burning on urination.   You loose more than 2 pounds of weight or gain more than 2 pounds of weight or as suggested by your caregiver.   You notice sudden swelling of your face, hands, and feet or legs.   You develop belly (abdominal) pain. Round ligament discomfort is a common non-cancerous (benign) cause of abdominal pain in pregnancy. Your caregiver still must evaluate you.   You develop a severe headache that does not go away.   You develop visual problems, blurred or double vision.   If you have not felt your baby move for more than 1 hour. If you think the baby is not moving as much as usual, eat something with sugar in it and lie down on your left side for an hour. The baby should move at least 4 to 5 times per hour. Call right away if your baby moves less than that.   You fall, are in a car accident or any kind of trauma.   There is mental or physical violence at home.  Document Released: 07/07/2001 Document Revised: 07/02/2011 Document Reviewed: 01/09/2009 [facility] Patient Information 2012 [place], [place]. Breastfeeding BENEFITS OF BREASTFEEDING For the baby  The first milk (colostrum)  helps the baby's digestive system function better.   There are antibodies from the mother in the milk that help the baby fight off  infections.   The baby has a lower incidence of asthma, allergies, and SIDS (sudden infant death syndrome).   The nutrients in breast milk are better than formulas for the baby and helps the baby's brain grow better.   Babies who breastfeed have less gas, colic, and constipation.  For the mother  Breastfeeding helps develop a very special bond between mother and baby.   It is more convenient, always available at the correct temperature and cheaper than formula feeding.   It burns calories in the mother and helps with losing weight that was gained during pregnancy.   It makes the uterus contract back down to normal size faster and slows bleeding following delivery.   Breastfeeding mothers have a lower risk of developing breast cancer.  NURSE FREQUENTLY  A healthy, full-term baby may breastfeed as often as every hour or space his or her feedings to every 3 hours.   How often to nurse will vary from baby to baby. Watch your baby for signs of hunger, not the clock.   Nurse as often as the baby requests, or when you feel the need to reduce the fullness of your breasts.   Awaken the baby if it has been 3 to 4 hours since the last feeding.   Frequent feeding will help the mother make more milk and will prevent problems like sore nipples and engorgement of the breasts.  BABY'S POSITION AT THE BREAST  Whether lying down or sitting, be sure that the baby's tummy is facing your tummy.   Support the breast with 4 fingers underneath the breast and the thumb above. Make sure your fingers are well away from the nipple and baby's mouth.   Stroke the baby's lips and cheek closest to the breast gently with your finger or nipple.   When the baby's mouth is open wide enough, place all of your nipple and as much of the dark area around the nipple as possible into your baby's mouth.   Pull the baby in close so the tip of the nose and the baby's cheeks touch the breast during the feeding.   FEEDINGS  The length of each feeding varies from baby to baby and from feeding to feeding.   The baby must suck about 2 to 3 minutes for your milk to get to him or her. This is called a "let down." For this reason, allow the baby to feed on each breast as long as he or she wants. Your baby will end the feeding when he or she has received the right balance of nutrients.   To break the suction, put your finger into the corner of the baby's mouth and slide it between his or her gums before removing your breast from his or her mouth. This will help prevent sore nipples.  REDUCING BREAST ENGORGEMENT  In the first week after your baby is born, you may experience signs of breast engorgement. When breasts are engorged, they feel heavy, warm, full, and may be tender to the touch. You can reduce engorgement if you:   Nurse frequently, every 2 to 3 hours. Mothers who breastfeed early and often have fewer problems with engorgement.   Place light ice packs on your breasts between feedings. This reduces swelling. Wrap the ice packs in a lightweight towel to protect your skin.   Apply moist hot  packs to your breast for 5 to 10 minutes before each feeding. This increases circulation and helps the milk flow.   Gently massage your breast before and during the feeding.   Make sure that the baby empties at least one breast at every feeding before switching sides.   Use a breast pump to empty the breasts if your baby is sleepy or not nursing well. You may also want to pump if you are returning to work or or you feel you are getting engorged.   Avoid bottle feeds, pacifiers or supplemental feedings of water or juice in place of breastfeeding.   Be sure the baby is latched on and positioned properly while breastfeeding.   Prevent fatigue, stress, and anemia.   Wear a supportive bra, avoiding underwire styles.   Eat a balanced diet with enough fluids.  If you follow these suggestions, your engorgement  should improve in 24 to 48 hours. If you are still experiencing difficulty, call your lactation consultant or caregiver. IS MY BABY GETTING ENOUGH MILK? Sometimes, mothers worry about whether their babies are getting enough milk. You can be assured that your baby is getting enough milk if:  The baby is actively sucking and you hear swallowing.   The baby nurses at least 8 to 12 times in a 24 hour time period. Nurse your baby until he or she unlatches or falls asleep at the first breast (at least 10 to 20 minutes), then offer the second side.   The baby is wetting 5 to 6 disposable diapers (6 to 8 cloth diapers) in a 24 hour period by 93 to 6 days of age.   The baby is having at least 2 to 3 stools every 24 hours for the first few months. Breast milk is all the food your baby needs. It is not necessary for your baby to have water or formula. In fact, to help your breasts make more milk, it is best not to give your baby supplemental feedings during the early weeks.   The stool should be soft and yellow.   The baby should gain 4 to 7 ounces per week after he is 71 days old.  TAKE CARE OF YOURSELF Take care of your breasts by:  Bathing or showering daily.   Avoiding the use of soaps on your nipples.   Start feedings on your left breast at one feeding and on your right breast at the next feeding.   You will notice an increase in your milk supply 2 to 5 days after delivery. You may feel some discomfort from engorgement, which makes your breasts very firm and often tender. Engorgement "peaks" out within 24 to 48 hours. In the meantime, apply warm moist towels to your breasts for 5 to 10 minutes before feeding. Gentle massage and expression of some milk before feeding will soften your breasts, making it easier for your baby to latch on. Wear a well fitting nursing bra and air dry your nipples for 10 to 15 minutes after each feeding.   Only use cotton bra pads.   Only use pure lanolin on your  nipples after nursing. You do not need to wash it off before nursing.  Take care of yourself by:   Eating well-balanced meals and nutritious snacks.   Drinking milk, fruit juice, and water to satisfy your thirst (about 8 glasses a day).   Getting plenty of rest.   Increasing calcium in your diet (1200 mg a day).   Avoiding foods that  you notice affect the baby in a bad way.  SEEK MEDICAL CARE IF:   You have any questions or difficulty with breastfeeding.   You need help.   You have a hard, red, sore area on your breast, accompanied by a fever of 100.5 F (38.1 C) or more.   Your baby is too sleepy to eat well or is having trouble sleeping.   Your baby is wetting less than 6 diapers per day, by 44 days of age.   Your baby's skin or white part of his or her eyes is more yellow than it was in the hospital.   You feel depressed.  Document Released: 07/13/2005 Document Revised: 07/02/2011 Document Reviewed: 02/25/2009 [facility] Patient Information 2012 [place], [place].

## 2011-10-14 ENCOUNTER — Encounter: Payer: Self-pay | Admitting: Family Medicine

## 2011-10-27 ENCOUNTER — Ambulatory Visit (INDEPENDENT_AMBULATORY_CARE_PROVIDER_SITE_OTHER): Payer: 59 | Admitting: Obstetrics & Gynecology

## 2011-10-27 DIAGNOSIS — Z3480 Encounter for supervision of other normal pregnancy, unspecified trimester: Secondary | ICD-10-CM

## 2011-10-27 NOTE — Progress Notes (Signed)
Mild sx of left femoral hernia. Cephalic by Leopold's

## 2011-10-27 NOTE — Patient Instructions (Signed)

## 2011-11-10 ENCOUNTER — Ambulatory Visit (INDEPENDENT_AMBULATORY_CARE_PROVIDER_SITE_OTHER): Payer: 59 | Admitting: Obstetrics & Gynecology

## 2011-11-10 VITALS — BP 97/55 | Wt 164.0 lb

## 2011-11-10 DIAGNOSIS — Z9889 Other specified postprocedural states: Secondary | ICD-10-CM

## 2011-11-10 DIAGNOSIS — Z3480 Encounter for supervision of other normal pregnancy, unspecified trimester: Secondary | ICD-10-CM

## 2011-11-10 DIAGNOSIS — Z3490 Encounter for supervision of normal pregnancy, unspecified, unspecified trimester: Secondary | ICD-10-CM

## 2011-11-10 DIAGNOSIS — Z98891 History of uterine scar from previous surgery: Secondary | ICD-10-CM

## 2011-11-10 NOTE — Patient Instructions (Signed)
Return to clinic for any obstetric concerns or go to [facility] for evaluation  

## 2011-11-10 NOTE — Progress Notes (Signed)
No complaints or concerns.  Fetal movement and labor precautions reviewed. 

## 2011-11-24 ENCOUNTER — Encounter: Payer: Self-pay | Admitting: Obstetrics and Gynecology

## 2011-11-24 ENCOUNTER — Other Ambulatory Visit: Payer: Self-pay | Admitting: Obstetrics and Gynecology

## 2011-11-24 ENCOUNTER — Ambulatory Visit (INDEPENDENT_AMBULATORY_CARE_PROVIDER_SITE_OTHER): Payer: 59 | Admitting: Obstetrics and Gynecology

## 2011-11-24 VITALS — BP 106/65 | Wt 166.0 lb

## 2011-11-24 DIAGNOSIS — Z98891 History of uterine scar from previous surgery: Secondary | ICD-10-CM

## 2011-11-24 DIAGNOSIS — Z3490 Encounter for supervision of normal pregnancy, unspecified, unspecified trimester: Secondary | ICD-10-CM

## 2011-11-24 DIAGNOSIS — K4190 Unilateral femoral hernia, without obstruction or gangrene, not specified as recurrent: Secondary | ICD-10-CM

## 2011-11-24 DIAGNOSIS — Z9889 Other specified postprocedural states: Secondary | ICD-10-CM

## 2011-11-24 DIAGNOSIS — Z3480 Encounter for supervision of other normal pregnancy, unspecified trimester: Secondary | ICD-10-CM

## 2011-11-24 LAB — OB RESULTS CONSOLE GBS: GBS: NEGATIVE

## 2011-11-24 NOTE — Progress Notes (Signed)
Patient doing well without complaints. Cultures collected. FM/labor precautions reviewed.

## 2011-11-25 LAB — GC/CHLAMYDIA PROBE AMP, GENITAL: Chlamydia, DNA Probe: NEGATIVE

## 2011-11-26 ENCOUNTER — Telehealth (HOSPITAL_COMMUNITY): Payer: Self-pay | Admitting: *Deleted

## 2011-11-26 NOTE — Telephone Encounter (Signed)
Preadmission screen  

## 2011-11-27 ENCOUNTER — Inpatient Hospital Stay (HOSPITAL_COMMUNITY)
Admission: AD | Admit: 2011-11-27 | Discharge: 2011-11-27 | Disposition: A | Discharge status: Home / Self Care | Payer: 59 | Source: Ambulatory Visit | Attending: Obstetrics & Gynecology | Admitting: Obstetrics & Gynecology

## 2011-11-27 ENCOUNTER — Encounter (HOSPITAL_COMMUNITY): Payer: Self-pay | Admitting: *Deleted

## 2011-11-27 DIAGNOSIS — Z98891 History of uterine scar from previous surgery: Secondary | ICD-10-CM

## 2011-11-27 DIAGNOSIS — O479 False labor, unspecified: Secondary | ICD-10-CM

## 2011-11-27 DIAGNOSIS — N949 Unspecified condition associated with female genital organs and menstrual cycle: Secondary | ICD-10-CM

## 2011-11-27 DIAGNOSIS — Z3490 Encounter for supervision of normal pregnancy, unspecified, unspecified trimester: Secondary | ICD-10-CM

## 2011-11-27 DIAGNOSIS — R109 Unspecified abdominal pain: Secondary | ICD-10-CM | POA: Insufficient documentation

## 2011-11-27 HISTORY — DX: Unspecified abdominal hernia without obstruction or gangrene: K46.9

## 2011-11-27 LAB — CULTURE, BETA STREP (GROUP B ONLY)

## 2011-11-27 NOTE — [facility] Provider Note (Signed)
Chief Complaint:  Abdominal Pain   First Provider Initiated Contact with Patient 11/27/11 1232      HPI  Frances Cohen is  26 y.o. [id number] at [redacted]w[redacted]d presents with lower abdominal pain in bilateral inguinal areas described as intermittent.  These pains woke the pt up last night and became worse when she moved to sit up or stand.  She had a c/s with her previous delivery and desires a VBAC with this pregnancy.  She reports good fetal movement, denies LOF, vaginal bleeding, vaginal itching/burning, urinary symptoms, h/a, dizziness, n/v, or fever/chills.     Pregnancy Course: uncomplicated  Past Medical History: Past Medical History  Diagnosis Date  . SAB (spontaneous abortion) 12/2010    TWO  . Abnormal Pap smear 2003  . Hernia 09-2011    Past Surgical History: Past Surgical History  Procedure Date  . Tonsillectomy   . Wisdom tooth extraction 2004     x4  . Cesarean section     Family History: Family History  Problem Relation Age of Onset  . Diabetes Mother   . Lung cancer Paternal Aunt   . Breast cancer Paternal Grandmother 8  . Prostate cancer Paternal Grandfather     Social History: History  Substance Use Topics  . Smoking status: Never Smoker   . Smokeless tobacco: Never Used  . Alcohol Use: No    Allergies:  Allergies  Allergen Reactions  . Penicillins Anaphylaxis and Swelling  . Shellfish Allergy Hives and Other (See Comments)    Gets a tight itchy throat.    Meds:  Prescriptions prior to admission  Medication Sig Dispense Refill  . acetaminophen (TYLENOL) 500 MG tablet Take 1,000 mg by mouth daily as needed. For pain      . calcium carbonate (TUMS - DOSED IN MG ELEMENTAL CALCIUM) 500 MG chewable tablet Chew 3-4 tablets by mouth daily as needed. For heartburn      . Prenatal Vit-Fe Fumarate-FA (MULTIVITAMIN-PRENATAL) 27-0.8 MG TABS Take 1 tablet by mouth daily.            Physical Exam  Blood pressure 113/67, pulse 92, temperature 97.9 F (36.6  C), temperature source Oral, resp. rate 20, last menstrual period 03/12/2011, SpO2 99.00%, unknown if currently breastfeeding. GENERAL: Well-developed, well-nourished female in no acute distress.  HEENT: normocephalic, good dentition HEART: normal rate RESP: normal effort ABDOMEN: Soft, nontender, nondistended, gravid.  EXTREMITIES: Nontender, no edema NEURO: alert and oriented  SPECULUM EXAM: Dilation: Fingertip Effacement (%): Thick Cervical Position: Posterior Exam by:: L. Leftwhich-Kirby, CNM  FHR monitoring for >2 hours FHT:  Baseline 135, moderate variability, accelerations present, no decelerations Contractions: occasional, lasting 70 sec    Labs: Not indicated   Imaging:  Not indicated  Assessment: Round ligament pain Braxton-hicks contractions Reactive NST   Plan: Discussed pt with Dr Macon Large FHR monitoring in MAU D/C home with labor precautions Fetal kick counting reviewed with pt F/U with prenatal provider Return to MAU as needed   LEFTWICH-KIRBY, Cashmere Dingley 5/3/20132:33 PM

## 2011-11-27 NOTE — MAU Note (Signed)
Abd pain at c/section incision site and lower back area since this am at 0300.  Pain woke pt up.  Denies vaginal bleeding or ROM.  Good fetal movement.

## 2011-11-27 NOTE — MAU Provider Note (Signed)
Attestation of Attending Supervision of Advanced Practitioner: Evaluation and management procedures were performed by the [facility] Fellow/PA/CNM/NP under my supervision and collaboration. Chart reviewed, and agree with management and plan.  Jaynie Collins, M.D. 11/27/2011 7:41 PM

## 2011-11-27 NOTE — Discharge Instructions (Signed)
Round Ligament Pain The round ligament is made up of muscle and fibrous tissue. It is attached to the uterus near the fallopian tube. The round ligament is located on both sides of the uterus and helps support the position of the uterus. It usually begins in the second trimester of pregnancy when the uterus comes out of the pelvis. The pain can come and go until the baby is delivered. Round ligament pain is not a serious problem and does not cause harm to the baby. CAUSE During pregnancy the uterus grows the most from the second trimester to delivery. As it grows, it stretches and slightly twists the round ligaments. When the uterus leans from one side to the other, the round ligament on the opposite side pulls and stretches. This can cause pain. SYMPTOMS  Pain can occur on one side or both sides. The pain is usually a short, sharp, and pinching-like. Sometimes it can be a dull, lingering and aching pain. The pain is located in the lower side of the abdomen or in the groin. The pain is internal and usually starts deep in the groin and moves up to the outside of the hip area. Pain can occur with:  Sudden change in position like getting out of bed or a chair.   Rolling over in bed.   Coughing or sneezing.   Walking too much.   Any type of physical activity.  DIAGNOSIS  Your caregiver will make sure there are no serious problems causing the pain. When nothing serious is found, the symptoms usually indicate that the pain is from the round ligament. TREATMENT   Sit down and relax when the pain starts.   Flex your knees up to your belly.   Lay on your side with a pillow under your belly (abdomen) and another one between your legs.   Sit in a hot bath for 15 to 20 minutes or until the pain goes away.  HOME CARE INSTRUCTIONS   Only take over-the-counter or prescriptions medicines for pain, discomfort or fever as directed by your caregiver.   Sit and stand slowly.   Avoid long walks if it  causes pain.   Stop or lessen your physical activities if it causes pain.  SEEK MEDICAL CARE IF:   The pain does not go away with any of your treatment.   You need stronger medication for the pain.   You develop back pain that you did not have before with the side pain.  SEEK IMMEDIATE MEDICAL CARE IF:   You develop a temperature of 102 F (38.9 C) or higher.   You develop uterine contractions.   You develop vaginal bleeding.   You develop nausea, vomiting or diarrhea.   You develop chills.   You have pain when you urinate.  Document Released: 04/21/2008 Document Revised: 07/02/2011 Document Reviewed: 04/21/2008 Exit[employer] Patient Information 2012 [employer]. Fetal Movement Counts Patient Name: __________________________________________________ Patient Due Date: ____________________ Kick counts is highly recommended in high risk pregnancies, but it is a good idea for every pregnant woman to do. Start counting fetal movements at 28 weeks of the pregnancy. Fetal movements increase after eating a full meal or eating or drinking something sweet (the blood sugar is higher). It is also important to drink plenty of fluids (well hydrated) before doing the count. Lie on your left side because it helps with the circulation or you can sit in a comfortable chair with your arms over your belly (abdomen) with no distractions around you. DOING   THE COUNT  Try to do the count the same time of day each time you do it.   Mark the day and time, then see how long it takes for you to feel 10 movements (kicks, flutters, swishes, rolls). You should have at least 10 movements within 2 hours. You will most likely feel 10 movements in much less than 2 hours. If you do not, wait an hour and count again. After a couple of days you will see a pattern.   What you are looking for is a change in the pattern or not enough counts in 2 hours. Is it taking longer in time to reach 10 movements?  SEEK MEDICAL CARE  IF:  You feel less than 10 counts in 2 hours. Tried twice.   No movement in one hour.   The pattern is changing or taking longer each day to reach 10 counts in 2 hours.   You feel the baby is not moving as it usually does.  Date: ____________ Movements: ____________ Start time: ____________ Finish time: ____________  Date: ____________ Movements: ____________ Start time: ____________ Finish time: ____________ Date: ____________ Movements: ____________ Start time: ____________ Finish time: ____________ Date: ____________ Movements: ____________ Start time: ____________ Finish time: ____________ Date: ____________ Movements: ____________ Start time: ____________ Finish time: ____________ Date: ____________ Movements: ____________ Start time: ____________ Finish time: ____________ Date: ____________ Movements: ____________ Start time: ____________ Finish time: ____________ Date: ____________ Movements: ____________ Start time: ____________ Finish time: ____________  Date: ____________ Movements: ____________ Start time: ____________ Finish time: ____________ Date: ____________ Movements: ____________ Start time: ____________ Finish time: ____________ Date: ____________ Movements: ____________ Start time: ____________ Finish time: ____________ Date: ____________ Movements: ____________ Start time: ____________ Finish time: ____________ Date: ____________ Movements: ____________ Start time: ____________ Finish time: ____________ Date: ____________ Movements: ____________ Start time: ____________ Finish time: ____________ Date: ____________ Movements: ____________ Start time: ____________ Finish time: ____________  Date: ____________ Movements: ____________ Start time: ____________ Finish time: ____________ Date: ____________ Movements: ____________ Start time: ____________ Finish time: ____________ Date: ____________ Movements: ____________ Start time: ____________ Finish time:  ____________ Date: ____________ Movements: ____________ Start time: ____________ Finish time: ____________ Date: ____________ Movements: ____________ Start time: ____________ Finish time: ____________ Date: ____________ Movements: ____________ Start time: ____________ Finish time: ____________ Date: ____________ Movements: ____________ Start time: ____________ Finish time: ____________  Date: ____________ Movements: ____________ Start time: ____________ Finish time: ____________ Date: ____________ Movements: ____________ Start time: ____________ Finish time: ____________ Date: ____________ Movements: ____________ Start time: ____________ Finish time: ____________ Date: ____________ Movements: ____________ Start time: ____________ Finish time: ____________ Date: ____________ Movements: ____________ Start time: ____________ Finish time: ____________ Date: ____________ Movements: ____________ Start time: ____________ Finish time: ____________ Date: ____________ Movements: ____________ Start time: ____________ Finish time: ____________  Date: ____________ Movements: ____________ Start time: ____________ Finish time: ____________ Date: ____________ Movements: ____________ Start time: ____________ Finish time: ____________ Date: ____________ Movements: ____________ Start time: ____________ Finish time: ____________ Date: ____________ Movements: ____________ Start time: ____________ Finish time: ____________ Date: ____________ Movements: ____________ Start time: ____________ Finish time: ____________ Date: ____________ Movements: ____________ Start time: ____________ Finish time: ____________ Date: ____________ Movements: ____________ Start time: ____________ Finish time: ____________  Date: ____________ Movements: ____________ Start time: ____________ Finish time: ____________ Date: ____________ Movements: ____________ Start time: ____________ Finish time: ____________ Date: ____________ Movements:  ____________ Start time: ____________ Finish time: ____________ Date: ____________ Movements: ____________ Start time: ____________ Finish time: ____________ Date: ____________ Movements: ____________ Start time: ____________ Finish time: ____________ Date: ____________ Movements: ____________ Start time: ____________ Finish time: ____________ Date:   ____________ Movements: ____________ Start time: ____________ Finish time: ____________  Date: ____________ Movements: ____________ Start time: ____________ Finish time: ____________ Date: ____________ Movements: ____________ Start time: ____________ Finish time: ____________ Date: ____________ Movements: ____________ Start time: ____________ Finish time: ____________ Date: ____________ Movements: ____________ Start time: ____________ Finish time: ____________ Date: ____________ Movements: ____________ Start time: ____________ Finish time: ____________ Date: ____________ Movements: ____________ Start time: ____________ Finish time: ____________ Date: ____________ Movements: ____________ Start time: ____________ Finish time: ____________  Date: ____________ Movements: ____________ Start time: ____________ Finish time: ____________ Date: ____________ Movements: ____________ Start time: ____________ Finish time: ____________ Date: ____________ Movements: ____________ Start time: ____________ Finish time: ____________ Date: ____________ Movements: ____________ Start time: ____________ Finish time: ____________ Date: ____________ Movements: ____________ Start time: ____________ Finish time: ____________ Date: ____________ Movements: ____________ Start time: ____________ Finish time: ____________ Document Released: 08/12/2006 Document Revised: 07/02/2011 Document Reviewed: 02/12/2009 [employer] Patient Information 2012 [employer]. 

## 2011-12-02 ENCOUNTER — Encounter (HOSPITAL_COMMUNITY): Payer: Self-pay | Admitting: Pharmacist

## 2011-12-02 ENCOUNTER — Ambulatory Visit (INDEPENDENT_AMBULATORY_CARE_PROVIDER_SITE_OTHER): Payer: 59 | Admitting: Obstetrics & Gynecology

## 2011-12-02 ENCOUNTER — Encounter: Payer: Self-pay | Admitting: Obstetrics & Gynecology

## 2011-12-02 VITALS — BP 106/65 | Wt 167.0 lb

## 2011-12-02 DIAGNOSIS — Z3480 Encounter for supervision of other normal pregnancy, unspecified trimester: Secondary | ICD-10-CM

## 2011-12-02 DIAGNOSIS — Z3490 Encounter for supervision of normal pregnancy, unspecified, unspecified trimester: Secondary | ICD-10-CM | POA: Insufficient documentation

## 2011-12-02 NOTE — Progress Notes (Signed)
Routine visit. She has now decided that she is very tired of being pregnant and wants to schedule a RLTCS asap. I have told her that the earliest we could do it is at 39 weeks. She declines PPS. She reports good FM and denies VB, ROM, or CTXs.

## 2011-12-07 ENCOUNTER — Encounter (HOSPITAL_COMMUNITY): Payer: Self-pay

## 2011-12-07 ENCOUNTER — Encounter (HOSPITAL_COMMUNITY)
Admission: RE | Admit: 2011-12-07 | Discharge: 2011-12-07 | Disposition: A | Discharge status: Home / Self Care | Payer: 59 | Source: Ambulatory Visit | Attending: Obstetrics and Gynecology | Admitting: Obstetrics and Gynecology

## 2011-12-07 VITALS — BP 100/60 | Ht 67.0 in | Wt 167.0 lb

## 2011-12-07 DIAGNOSIS — Z3490 Encounter for supervision of normal pregnancy, unspecified, unspecified trimester: Secondary | ICD-10-CM

## 2011-12-07 LAB — CBC
HCT: 35.4 % — ABNORMAL LOW (ref 36.0–46.0)
Hemoglobin: 11.5 g/dL — ABNORMAL LOW (ref 12.0–15.0)
MCH: 29.8 pg (ref 26.0–34.0)
MCHC: 32.5 g/dL (ref 30.0–36.0)
RBC: 3.86 MIL/uL — ABNORMAL LOW (ref 3.87–5.11)

## 2011-12-07 LAB — SURGICAL PCR SCREEN: MRSA, PCR: NEGATIVE

## 2011-12-07 LAB — RPR: RPR Ser Ql: NONREACTIVE

## 2011-12-07 NOTE — Patient Instructions (Addendum)
YOUR PROCEDURE IS SCHEDULED ON:12/10/11  ENTER THROUGH THE MAIN ENTRANCE OF [facility] AT:1:30pm  USE DESK PHONE AND DIAL [phone] TO INFORM us OF YOUR ARRIVAL  CALL [phone] IF YOU HAVE ANY QUESTIONS OR PROBLEMS PRIOR TO YOUR ARRIVAL.  REMEMBER: DO NOT EAT AFTER MIDNIGHT : Wed  SPECIAL INSTRUCTIONS:clear liquids ok until 11am on Thursday   YOU MAY BRUSH YOUR TEETH THE MORNING OF SURGERY   TAKE THESE MEDICINES THE DAY OF SURGERY WITH SIP OF WATER:none  DO NOT WEAR JEWELRY, EYE MAKEUP, LIPSTICK OR DARK FINGERNAIL POLISH DO NOT WEAR LOTIONS  DO NOT SHAVE FOR 48 HOURS PRIOR TO SURGERY  YOU WILL NOT BE ALLOWED TO DRIVE YOURSELF HOME.  NAME OF DRIVER:Reggie

## 2011-12-08 ENCOUNTER — Ambulatory Visit (INDEPENDENT_AMBULATORY_CARE_PROVIDER_SITE_OTHER): Payer: 59 | Admitting: Family Medicine

## 2011-12-08 ENCOUNTER — Encounter: Payer: 59 | Admitting: Family Medicine

## 2011-12-08 DIAGNOSIS — Z3480 Encounter for supervision of other normal pregnancy, unspecified trimester: Secondary | ICD-10-CM

## 2011-12-08 NOTE — Progress Notes (Signed)
Patient is here for routine prenatal check. 

## 2011-12-08 NOTE — Progress Notes (Signed)
For RLTCS at 39 wks @ 3 pm. Ready.

## 2011-12-08 NOTE — Pre-Procedure Instructions (Signed)
Incomplete prenatal record (no lab results) at PAT appt. I called [facility] to obtain some labs but HIV result not found so I ordered it from lab. Hep B surface Ab result reads 35.97 which is consistent with immunity.

## 2011-12-08 NOTE — Patient Instructions (Signed)
Breastfeeding BENEFITS OF BREASTFEEDING For the baby  The first milk (colostrum) helps the baby's digestive system function better.   There are antibodies from the mother in the milk that help the baby fight off infections.   The baby has a lower incidence of asthma, allergies, and SIDS (sudden infant death syndrome).   The nutrients in breast milk are better than formulas for the baby and helps the baby's brain grow better.   Babies who breastfeed have less gas, colic, and constipation.  For the mother  Breastfeeding helps develop a very special bond between mother and baby.   It is more convenient, always available at the correct temperature and cheaper than formula feeding.   It burns calories in the mother and helps with losing weight that was gained during pregnancy.   It makes the uterus contract back down to normal size faster and slows bleeding following delivery.   Breastfeeding mothers have a lower risk of developing breast cancer.  NURSE FREQUENTLY  A healthy, full-term baby may breastfeed as often as every hour or space his or her feedings to every 3 hours.   How often to nurse will vary from baby to baby. Watch your baby for signs of hunger, not the clock.   Nurse as often as the baby requests, or when you feel the need to reduce the fullness of your breasts.   Awaken the baby if it has been 3 to 4 hours since the last feeding.   Frequent feeding will help the mother make more milk and will prevent problems like sore nipples and engorgement of the breasts.  BABY'S POSITION AT THE BREAST  Whether lying down or sitting, be sure that the baby's tummy is facing your tummy.   Support the breast with 4 fingers underneath the breast and the thumb above. Make sure your fingers are well away from the nipple and baby's mouth.   Stroke the baby's lips and cheek closest to the breast gently with your finger or nipple.   When the baby's mouth is open wide enough, place all  of your nipple and as much of the dark area around the nipple as possible into your baby's mouth.   Pull the baby in close so the tip of the nose and the baby's cheeks touch the breast during the feeding.  FEEDINGS  The length of each feeding varies from baby to baby and from feeding to feeding.   The baby must suck about 2 to 3 minutes for your milk to get to him or her. This is called a "let down." For this reason, allow the baby to feed on each breast as long as he or she wants. Your baby will end the feeding when he or she has received the right balance of nutrients.   To break the suction, put your finger into the corner of the baby's mouth and slide it between his or her gums before removing your breast from his or her mouth. This will help prevent sore nipples.  REDUCING BREAST ENGORGEMENT  In the first week after your baby is born, you may experience signs of breast engorgement. When breasts are engorged, they feel heavy, warm, full, and may be tender to the touch. You can reduce engorgement if you:   Nurse frequently, every 2 to 3 hours. Mothers who breastfeed early and often have fewer problems with engorgement.   Place light ice packs on your breasts between feedings. This reduces swelling. Wrap the ice packs in a   lightweight towel to protect your skin.   Apply moist hot packs to your breast for 5 to 10 minutes before each feeding. This increases circulation and helps the milk flow.   Gently massage your breast before and during the feeding.   Make sure that the baby empties at least one breast at every feeding before switching sides.   Use a breast pump to empty the breasts if your baby is sleepy or not nursing well. You may also want to pump if you are returning to work or or you feel you are getting engorged.   Avoid bottle feeds, pacifiers or supplemental feedings of water or juice in place of breastfeeding.   Be sure the baby is latched on and positioned properly while  breastfeeding.   Prevent fatigue, stress, and anemia.   Wear a supportive bra, avoiding underwire styles.   Eat a balanced diet with enough fluids.  If you follow these suggestions, your engorgement should improve in 24 to 48 hours. If you are still experiencing difficulty, call your lactation consultant or caregiver. IS MY BABY GETTING ENOUGH MILK? Sometimes, mothers worry about whether their babies are getting enough milk. You can be assured that your baby is getting enough milk if:  The baby is actively sucking and you hear swallowing.   The baby nurses at least 8 to 12 times in a 24 hour time period. Nurse your baby until he or she unlatches or falls asleep at the first breast (at least 10 to 20 minutes), then offer the second side.   The baby is wetting 5 to 6 disposable diapers (6 to 8 cloth diapers) in a 24 hour period by 5 to 6 days of age.   The baby is having at least 2 to 3 stools every 24 hours for the first few months. Breast milk is all the food your baby needs. It is not necessary for your baby to have water or formula. In fact, to help your breasts make more milk, it is best not to give your baby supplemental feedings during the early weeks.   The stool should be soft and yellow.   The baby should gain 4 to 7 ounces per week after he is 4 days old.  TAKE CARE OF YOURSELF Take care of your breasts by:  Bathing or showering daily.   Avoiding the use of soaps on your nipples.   Start feedings on your left breast at one feeding and on your right breast at the next feeding.   You will notice an increase in your milk supply 2 to 5 days after delivery. You may feel some discomfort from engorgement, which makes your breasts very firm and often tender. Engorgement "peaks" out within 24 to 48 hours. In the meantime, apply warm moist towels to your breasts for 5 to 10 minutes before feeding. Gentle massage and expression of some milk before feeding will soften your breasts, making  it easier for your baby to latch on. Wear a well fitting nursing bra and air dry your nipples for 10 to 15 minutes after each feeding.   Only use cotton bra pads.   Only use pure lanolin on your nipples after nursing. You do not need to wash it off before nursing.  Take care of yourself by:   Eating well-balanced meals and nutritious snacks.   Drinking milk, fruit juice, and water to satisfy your thirst (about 8 glasses a day).   Getting plenty of rest.   Increasing calcium in   your diet (1200 mg a day).   Avoiding foods that you notice affect the baby in a bad way.  SEEK MEDICAL CARE IF:   You have any questions or difficulty with breastfeeding.   You need help.   You have a hard, red, sore area on your breast, accompanied by a fever of 100.5 F (38.1 C) or more.   Your baby is too sleepy to eat well or is having trouble sleeping.   Your baby is wetting less than 6 diapers per day, by 5 days of age.   Your baby's skin or white part of his or her eyes is more yellow than it was in the hospital.   You feel depressed.  Document Released: 07/13/2005 Document Revised: 07/02/2011 Document Reviewed: 02/25/2009 [employer] Patient Information 2012 [employer]. 

## 2011-12-09 MED ORDER — SCOPOLAMINE 1 MG/3DAYS TD PT72: 1.0000 | MEDICATED_PATCH | Freq: Once | TRANSDERMAL | Status: DC

## 2011-12-09 MED ORDER — LACTATED RINGERS IV SOLN: INTRAVENOUS | Status: DC

## 2011-12-10 ENCOUNTER — Encounter (HOSPITAL_COMMUNITY): Payer: Self-pay

## 2011-12-10 ENCOUNTER — Inpatient Hospital Stay (HOSPITAL_COMMUNITY): Payer: 59

## 2011-12-10 ENCOUNTER — Inpatient Hospital Stay (HOSPITAL_COMMUNITY)
Admission: RE | Admit: 2011-12-10 | Discharge: 2011-12-13 | DRG: 766 | Disposition: A | Discharge status: Home / Self Care | Payer: 59 | Source: Ambulatory Visit | Attending: Obstetrics and Gynecology | Admitting: Obstetrics and Gynecology

## 2011-12-10 ENCOUNTER — Encounter (HOSPITAL_COMMUNITY)
Admission: RE | Disposition: A | Discharge status: Home / Self Care | Payer: Self-pay | Source: Ambulatory Visit | Attending: Obstetrics and Gynecology

## 2011-12-10 ENCOUNTER — Encounter (HOSPITAL_COMMUNITY): Payer: Self-pay | Admitting: *Deleted

## 2011-12-10 DIAGNOSIS — O34219 Maternal care for unspecified type scar from previous cesarean delivery: Principal | ICD-10-CM | POA: Diagnosis present

## 2011-12-10 DIAGNOSIS — Z01818 Encounter for other preprocedural examination: Secondary | ICD-10-CM

## 2011-12-10 DIAGNOSIS — Z3490 Encounter for supervision of normal pregnancy, unspecified, unspecified trimester: Secondary | ICD-10-CM

## 2011-12-10 DIAGNOSIS — Z01812 Encounter for preprocedural laboratory examination: Secondary | ICD-10-CM

## 2011-12-10 SURGERY — Surgical Case
Anesthesia: Spinal | Site: Abdomen | Wound class: Clean Contaminated

## 2011-12-10 MED ORDER — MEPERIDINE HCL 25 MG/ML IJ SOLN: 6.2500 mg | INTRAMUSCULAR | Status: DC | PRN

## 2011-12-10 MED ORDER — OXYTOCIN 10 UNIT/ML IJ SOLN
INTRAMUSCULAR | Status: DC | PRN
  Administered 2011-12-10: 20 [IU] via INTRAMUSCULAR

## 2011-12-10 MED ORDER — SIMETHICONE 80 MG PO CHEW
80.0000 mg | CHEWABLE_TABLET | Freq: Three times a day (TID) | ORAL | Status: DC
  Administered 2011-12-10 – 2011-12-13 (×10): 80 mg via ORAL

## 2011-12-10 MED ORDER — EPHEDRINE SULFATE 50 MG/ML IJ SOLN
INTRAMUSCULAR | Status: DC | PRN
  Administered 2011-12-10: 10 mg via INTRAVENOUS

## 2011-12-10 MED ORDER — OXYTOCIN 20 UNITS IN LACTATED RINGERS INFUSION - SIMPLE
125.0000 mL/h | INTRAVENOUS | Status: AC
  Administered 2011-12-10: 125 mL/h via INTRAVENOUS

## 2011-12-10 MED ORDER — MENTHOL 3 MG MT LOZG: 1.0000 | LOZENGE | OROMUCOSAL | Status: DC | PRN

## 2011-12-10 MED ORDER — DIPHENHYDRAMINE HCL 25 MG PO CAPS: 25.0000 mg | ORAL_CAPSULE | Freq: Four times a day (QID) | ORAL | Status: DC | PRN

## 2011-12-10 MED ORDER — SODIUM CHLORIDE 0.9 % IJ SOLN: 3.0000 mL | INTRAMUSCULAR | Status: DC | PRN

## 2011-12-10 MED ORDER — METOCLOPRAMIDE HCL 5 MG/ML IJ SOLN: 10.0000 mg | Freq: Three times a day (TID) | INTRAMUSCULAR | Status: DC | PRN

## 2011-12-10 MED ORDER — LACTATED RINGERS IV SOLN
INTRAVENOUS | Status: DC
  Administered 2011-12-10 (×3): via INTRAVENOUS

## 2011-12-10 MED ORDER — MORPHINE SULFATE 0.5 MG/ML IJ SOLN
INTRAMUSCULAR | Status: AC
  Filled 2011-12-10: qty 10

## 2011-12-10 MED ORDER — MORPHINE SULFATE (PF) 0.5 MG/ML IJ SOLN
INTRAMUSCULAR | Status: DC | PRN
  Administered 2011-12-10: .1 mg via EPIDURAL

## 2011-12-10 MED ORDER — FENTANYL CITRATE 0.05 MG/ML IJ SOLN
INTRAMUSCULAR | Status: DC | PRN
  Administered 2011-12-10 (×2): 25 ug via INTRAVENOUS
  Administered 2011-12-10: 50 ug via INTRAVENOUS

## 2011-12-10 MED ORDER — WITCH HAZEL-GLYCERIN EX PADS: 1.0000 "application " | MEDICATED_PAD | CUTANEOUS | Status: DC | PRN

## 2011-12-10 MED ORDER — SCOPOLAMINE 1 MG/3DAYS TD PT72: 1.0000 | MEDICATED_PATCH | Freq: Once | TRANSDERMAL | Status: DC

## 2011-12-10 MED ORDER — OXYCODONE-ACETAMINOPHEN 5-325 MG PO TABS
1.0000 | ORAL_TABLET | ORAL | Status: DC | PRN
  Administered 2011-12-11 – 2011-12-12 (×4): 1 via ORAL
  Filled 2011-12-10 (×5): qty 1
  Filled 2011-12-10: qty 2

## 2011-12-10 MED ORDER — MIDAZOLAM HCL 2 MG/2ML IJ SOLN: 0.5000 mg | Freq: Once | INTRAMUSCULAR | Status: DC | PRN

## 2011-12-10 MED ORDER — PHENYLEPHRINE 40 MCG/ML (10ML) SYRINGE FOR IV PUSH (FOR BLOOD PRESSURE SUPPORT)
PREFILLED_SYRINGE | INTRAVENOUS | Status: AC
  Filled 2011-12-10: qty 5

## 2011-12-10 MED ORDER — LANOLIN HYDROUS EX OINT: 1.0000 "application " | TOPICAL_OINTMENT | CUTANEOUS | Status: DC | PRN

## 2011-12-10 MED ORDER — DIPHENHYDRAMINE HCL 50 MG/ML IJ SOLN: 12.5000 mg | INTRAMUSCULAR | Status: DC | PRN

## 2011-12-10 MED ORDER — FENTANYL CITRATE 0.05 MG/ML IJ SOLN
INTRAMUSCULAR | Status: AC
  Filled 2011-12-10: qty 2

## 2011-12-10 MED ORDER — PRENATAL MULTIVITAMIN CH
1.0000 | ORAL_TABLET | Freq: Every day | ORAL | Status: DC
  Administered 2011-12-11 – 2011-12-12 (×2): 1 via ORAL
  Filled 2011-12-10 (×2): qty 1

## 2011-12-10 MED ORDER — LACTATED RINGERS IV SOLN
INTRAVENOUS | Status: DC
  Administered 2011-12-11: 02:00:00 via INTRAVENOUS

## 2011-12-10 MED ORDER — SCOPOLAMINE 1 MG/3DAYS TD PT72
MEDICATED_PATCH | TRANSDERMAL | Status: AC
  Administered 2011-12-10: 1.5 mg via TRANSDERMAL
  Filled 2011-12-10: qty 1

## 2011-12-10 MED ORDER — ONDANSETRON HCL 4 MG/2ML IJ SOLN: 4.0000 mg | Freq: Three times a day (TID) | INTRAMUSCULAR | Status: DC | PRN

## 2011-12-10 MED ORDER — NALBUPHINE HCL 10 MG/ML IJ SOLN
5.0000 mg | INTRAMUSCULAR | Status: DC | PRN
  Administered 2011-12-11: 10 mg via SUBCUTANEOUS

## 2011-12-10 MED ORDER — SODIUM CHLORIDE 0.9 % IV SOLN: 1.0000 ug/kg/h | INTRAVENOUS | Status: DC | PRN

## 2011-12-10 MED ORDER — ONDANSETRON HCL 4 MG/2ML IJ SOLN
INTRAMUSCULAR | Status: DC | PRN
  Administered 2011-12-10: 4 mg via INTRAVENOUS

## 2011-12-10 MED ORDER — GENTAMICIN SULFATE 40 MG/ML IJ SOLN
INTRAVENOUS | Status: DC | PRN
  Administered 2011-12-10: 100 mL via INTRAVENOUS

## 2011-12-10 MED ORDER — ONDANSETRON HCL 4 MG PO TABS: 4.0000 mg | ORAL_TABLET | ORAL | Status: DC | PRN

## 2011-12-10 MED ORDER — SIMETHICONE 80 MG PO CHEW: 80.0000 mg | CHEWABLE_TABLET | ORAL | Status: DC | PRN

## 2011-12-10 MED ORDER — OXYTOCIN 10 UNIT/ML IJ SOLN
INTRAMUSCULAR | Status: AC
  Filled 2011-12-10: qty 2

## 2011-12-10 MED ORDER — ACETAMINOPHEN 10 MG/ML IV SOLN: 1000.0000 mg | Freq: Four times a day (QID) | INTRAVENOUS | Status: AC | PRN

## 2011-12-10 MED ORDER — DIPHENHYDRAMINE HCL 25 MG PO CAPS: 25.0000 mg | ORAL_CAPSULE | ORAL | Status: DC | PRN

## 2011-12-10 MED ORDER — FENTANYL CITRATE 0.05 MG/ML IJ SOLN: 25.0000 ug | INTRAMUSCULAR | Status: DC | PRN

## 2011-12-10 MED ORDER — ONDANSETRON HCL 4 MG/2ML IJ SOLN
INTRAMUSCULAR | Status: AC
  Filled 2011-12-10: qty 2

## 2011-12-10 MED ORDER — GENTAMICIN SULFATE 40 MG/ML IJ SOLN
INTRAMUSCULAR | Status: DC
  Filled 2011-12-10: qty 2.5

## 2011-12-10 MED ORDER — ONDANSETRON HCL 4 MG/2ML IJ SOLN: 4.0000 mg | INTRAMUSCULAR | Status: DC | PRN

## 2011-12-10 MED ORDER — OXYTOCIN 20 UNITS IN LACTATED RINGERS INFUSION - SIMPLE
INTRAVENOUS | Status: AC
  Administered 2011-12-10: 125 mL/h via INTRAVENOUS
  Filled 2011-12-10: qty 1000

## 2011-12-10 MED ORDER — KETOROLAC TROMETHAMINE 30 MG/ML IJ SOLN
30.0000 mg | Freq: Four times a day (QID) | INTRAMUSCULAR | Status: AC | PRN
  Administered 2011-12-10 – 2011-12-11 (×2): 30 mg via INTRAVENOUS
  Filled 2011-12-10: qty 1

## 2011-12-10 MED ORDER — BUPIVACAINE IN DEXTROSE 0.75-8.25 % IT SOLN
INTRATHECAL | Status: DC | PRN
  Administered 2011-12-10: 1.6 mL via INTRATHECAL

## 2011-12-10 MED ORDER — IBUPROFEN 600 MG PO TABS
600.0000 mg | ORAL_TABLET | Freq: Four times a day (QID) | ORAL | Status: DC
  Administered 2011-12-11 – 2011-12-13 (×9): 600 mg via ORAL
  Filled 2011-12-10 (×9): qty 1

## 2011-12-10 MED ORDER — KETOROLAC TROMETHAMINE 30 MG/ML IJ SOLN: 30.0000 mg | Freq: Four times a day (QID) | INTRAMUSCULAR | Status: AC | PRN

## 2011-12-10 MED ORDER — NALBUPHINE HCL 10 MG/ML IJ SOLN
5.0000 mg | INTRAMUSCULAR | Status: DC | PRN
  Administered 2011-12-11: 5 mg via INTRAVENOUS
  Filled 2011-12-10 (×2): qty 1

## 2011-12-10 MED ORDER — NALOXONE HCL 0.4 MG/ML IJ SOLN: 0.4000 mg | INTRAMUSCULAR | Status: DC | PRN

## 2011-12-10 MED ORDER — PHENYLEPHRINE HCL 10 MG/ML IJ SOLN
INTRAMUSCULAR | Status: DC | PRN
  Administered 2011-12-10 (×3): 80 ug via INTRAVENOUS
  Administered 2011-12-10 (×2): 40 ug via INTRAVENOUS

## 2011-12-10 MED ORDER — DIBUCAINE 1 % RE OINT: 1.0000 "application " | TOPICAL_OINTMENT | RECTAL | Status: DC | PRN

## 2011-12-10 MED ORDER — KETOROLAC TROMETHAMINE 30 MG/ML IJ SOLN
INTRAMUSCULAR | Status: AC
  Administered 2011-12-10: 30 mg via INTRAVENOUS
  Filled 2011-12-10: qty 1

## 2011-12-10 MED ORDER — SCOPOLAMINE 1 MG/3DAYS TD PT72
1.0000 | MEDICATED_PATCH | Freq: Once | TRANSDERMAL | Status: DC
  Administered 2011-12-10: 1.5 mg via TRANSDERMAL

## 2011-12-10 MED ORDER — PROMETHAZINE HCL 25 MG/ML IJ SOLN: 6.2500 mg | INTRAMUSCULAR | Status: DC | PRN

## 2011-12-10 MED ORDER — KETOROLAC TROMETHAMINE 30 MG/ML IJ SOLN
INTRAMUSCULAR | Status: AC
  Filled 2011-12-10: qty 1

## 2011-12-10 MED ORDER — ACETAMINOPHEN 325 MG PO TABS: 325.0000 mg | ORAL_TABLET | ORAL | Status: DC | PRN

## 2011-12-10 MED ORDER — DIPHENHYDRAMINE HCL 50 MG/ML IJ SOLN: 25.0000 mg | INTRAMUSCULAR | Status: DC | PRN

## 2011-12-10 MED ORDER — EPHEDRINE 5 MG/ML INJ
INTRAVENOUS | Status: AC
  Filled 2011-12-10: qty 10

## 2011-12-10 MED ORDER — SENNOSIDES-DOCUSATE SODIUM 8.6-50 MG PO TABS
2.0000 | ORAL_TABLET | Freq: Every day | ORAL | Status: DC
  Administered 2011-12-11 – 2011-12-12 (×2): 2 via ORAL

## 2011-12-10 MED ORDER — TETANUS-DIPHTH-ACELL PERTUSSIS 5-2.5-18.5 LF-MCG/0.5 IM SUSP: 0.5000 mL | Freq: Once | INTRAMUSCULAR | Status: DC

## 2011-12-10 SURGICAL SUPPLY — 25 items
CHLORAPREP W/TINT 26ML (MISCELLANEOUS) ×2 IMPLANT
CONTAINER PREFILL 10% NBF 15ML (MISCELLANEOUS) IMPLANT
DRESSING TELFA 8X3 (GAUZE/BANDAGES/DRESSINGS) IMPLANT
ELECT REM PT RETURN 9FT ADLT (ELECTROSURGICAL) ×2
ELECTRODE REM PT RTRN 9FT ADLT (ELECTROSURGICAL) ×1 IMPLANT
EXTRACTOR VACUUM M CUP 4 TUBE (SUCTIONS) IMPLANT
GAUZE SPONGE 4X4 12PLY STRL LF (GAUZE/BANDAGES/DRESSINGS) IMPLANT
GLOVE BIOGEL PI IND STRL 6.5 (GLOVE) ×2 IMPLANT
GLOVE BIOGEL PI INDICATOR 6.5 (GLOVE) ×2
GLOVE SURG SS PI 6.0 STRL IVOR (GLOVE) ×2 IMPLANT
GOWN PREVENTION PLUS LG XLONG (DISPOSABLE) ×6 IMPLANT
KIT ABG SYR 3ML LUER SLIP (SYRINGE) IMPLANT
NEEDLE HYPO 25X5/8 SAFETYGLIDE (NEEDLE) IMPLANT
NS IRRIG 1000ML POUR BTL (IV SOLUTION) ×2 IMPLANT
PACK C SECTION WH (CUSTOM PROCEDURE TRAY) ×2 IMPLANT
PAD ABD 7.5X8 STRL (GAUZE/BANDAGES/DRESSINGS) IMPLANT
RTRCTR C-SECT PINK 25CM LRG (MISCELLANEOUS) ×2 IMPLANT
SEPRAFILM MEMBRANE 5X6 (MISCELLANEOUS) IMPLANT
SLEEVE SCD COMPRESS KNEE MED (MISCELLANEOUS) IMPLANT
STAPLER VISISTAT 35W (STAPLE) ×2 IMPLANT
SUT PLAIN 0 NONE (SUTURE) IMPLANT
SUT VIC AB 0 CT1 36 (SUTURE) ×8 IMPLANT
TOWEL OR 17X24 6PK STRL BLUE (TOWEL DISPOSABLE) ×4 IMPLANT
TRAY FOLEY CATH 14FR (SET/KITS/TRAYS/PACK) ×2 IMPLANT
WATER STERILE IRR 1000ML POUR (IV SOLUTION) ×2 IMPLANT

## 2011-12-10 NOTE — H&P (Signed)
Frances Cohen is a 26 y.o. female [id number] at 2 weeks based on [facility] 12/17/2011 presenting for scheduled repeat cesarean section. Patient doing well without complaints. Patient with prenatal care complicated by the presence of a femoral hernia. History OB History    Grav Para Term Preterm Abortions TAB SAB Ect Mult Living   4 1 1  2  1   1      Past Medical History  Diagnosis Date  . SAB (spontaneous abortion) 12/2010    TWO  . Abnormal Pap smear 2003  . Hernia 09-2011  . No pertinent past medical history    Past Surgical History  Procedure Date  . Tonsillectomy   . Wisdom tooth extraction 2004     x4  . Cesarean section    Family History: family history includes Breast cancer (age of onset:69) in her paternal grandmother; Diabetes in her mother; Lung cancer in her paternal aunt; and Prostate cancer in her paternal grandfather. Social History:  reports that she has never smoked. She has never used smokeless tobacco. She reports that she does not drink alcohol or use illicit drugs.  Review of Systems  All other systems reviewed and are negative.      Blood pressure 100/70, pulse 101, temperature 97.7 F (36.5 C), temperature source Oral, resp. rate 16, last menstrual period 03/12/2011, SpO2 98.00%, unknown if currently breastfeeding. Exam Physical Exam   GENERAL: Well-developed, well-nourished female in no acute distress.  HEENT: Normocephalic, atraumatic. Sclerae anicteric.  NECK: Supple. Normal thyroid.  LUNGS: Clear to auscultation bilaterally.  HEART: Regular rate and rhythm. ABDOMEN: Soft, gravid, nontender. PELVIC: Normal external female genitalia. Vagina is pink and rugated.  Normal discharge. Normal appearing cervix. Uterus is normal in size.  No adnexal mass or tenderness. EXTREMITIES: No cyanosis, clubbing, or edema, 2+ distal pulses.  Prenatal labs: ABO, Rh: O/Positive/-- (10/08 0000) Antibody: Negative (10/08 0000) Rubella: Immune (10/08 0000) RPR: NON  REACTIVE (05/13 1508)  HBsAg: Indeterminate (10/08 0000)  HIV:   Neg GBS: Negative (04/30 0000)   Assessment/Plan: 26 yo [id number] at 39 weeks here for elective repeat c-section - Risks, benefits and alternatives explained including but not limited to risk of bleeding, infection and damage to adjacent organs. Patient verbalized understanding. All questions answered. Consent signed.  Kirsty Monjaraz 12/10/2011, 2:23 PM

## 2011-12-10 NOTE — Anesthesia Procedure Notes (Addendum)
Spinal  Patient location during procedure: OR Start time: 12/10/2011 2:53 PM End time: 12/10/2011 2:55 PM Staffing Anesthesiologist: Brayton Caves R Performed by: anesthesiologist  Preanesthetic Checklist Completed: patient identified, site marked, surgical consent, pre-op evaluation, timeout performed, IV checked, risks and benefits discussed and monitors and equipment checked Spinal Block Patient position: sitting Prep: DuraPrep Patient monitoring: heart rate, continuous pulse ox and blood pressure Approach: midline Location: L3-4 Injection technique: single-shot Needle Needle type: Sprotte  Needle gauge: 24 G Needle length: 9 cm Assessment Sensory level: T6 Additional Notes Performed with Wyline Beady, SRNA

## 2011-12-10 NOTE — Anesthesia Postprocedure Evaluation (Signed)
Anesthesia Post Note  Patient: Frances Cohen  Procedure(s) Performed: Procedure(s) (LRB): CESAREAN SECTION (N/A)  Anesthesia type: Spinal  Patient location: PACU  Post pain: Pain level controlled  Post assessment: Post-op Vital signs reviewed  Last Vitals:  Filed Vitals:   12/10/11 1615  BP: 103/67  Pulse: 84  Temp:   Resp: 19    Post vital signs: Reviewed  Level of consciousness: awake  Complications: No apparent anesthesia complications

## 2011-12-10 NOTE — Anesthesia Preprocedure Evaluation (Signed)
Anesthesia Evaluation  Patient identified by MRN, date of birth, ID band Patient awake    Reviewed: Allergy & Precautions, H&P , NPO status , Patient's Chart, lab work & pertinent test results  Airway Mallampati: II      Dental No notable dental hx.    Pulmonary neg pulmonary ROS,  breath sounds clear to auscultation  Pulmonary exam normal       Cardiovascular Exercise Tolerance: Good negative cardio ROS  Rhythm:regular Rate:Normal     Neuro/Psych negative neurological ROS  negative psych ROS   GI/Hepatic negative GI ROS, Neg liver ROS,   Endo/Other  negative endocrine ROS  Renal/GU negative Renal ROS  negative genitourinary   Musculoskeletal   Abdominal Normal abdominal exam  (+)   Peds  Hematology negative hematology ROS (+)   Anesthesia Other Findings SAB (spontaneous abortion) 12/2010 TWO Abnormal Pap smear 2003      Hernia 09-2011   No pertinent past medical history    Reproductive/Obstetrics (+) Pregnancy                           Anesthesia Physical Anesthesia Plan  ASA: II  Anesthesia Plan: Spinal   Post-op Pain Management:    Induction:   Airway Management Planned:   Additional Equipment:   Intra-op Plan:   Post-operative Plan:   Informed Consent: I have reviewed the patients History and Physical, chart, labs and discussed the procedure including the risks, benefits and alternatives for the proposed anesthesia with the patient or authorized representative who has indicated his/her understanding and acceptance.     Plan Discussed with: Anesthesiologist, CRNA and Surgeon  Anesthesia Plan Comments:         Anesthesia Quick Evaluation

## 2011-12-10 NOTE — Addendum Note (Signed)
Addendum  created 12/10/11 1707 by Velna Hatchet, MD   Modules edited:Orders, PRL Based Order Sets

## 2011-12-10 NOTE — Op Note (Addendum)
Frances Cohen PROCEDURE DATE: 12/10/2011  PREOPERATIVE DIAGNOSIS: Intrauterine pregnancy at  [redacted]w[redacted]d weeks gestation; elective repeat cesarean section  POSTOPERATIVE DIAGNOSIS: The same  PROCEDURE:     Cesarean Section  SURGEON:  Dr. Catalina Antigua  ASSISTANT: None  INDICATIONS: Frances Cohen is a 26 y.o. [id number] at [redacted]w[redacted]d scheduled for cesarean section secondary to patient declines vag del attempt.  The risks of cesarean section discussed with the patient included but were not limited to: bleeding which may require transfusion or reoperation; infection which may require antibiotics; injury to bowel, bladder, ureters or other surrounding organs; injury to the fetus; need for additional procedures including hysterectomy in the event of a life-threatening hemorrhage; placental abnormalities wth subsequent pregnancies, incisional problems, thromboembolic phenomenon and other postoperative/anesthesia complications. The patient concurred with the proposed plan, giving informed written consent for the procedure.    FINDINGS:  Viable female infant in cephalic presentation.  Apgars 8 and 9, weight unavailable at the time of witting this note.  Clear amniotic fluid.  Intact placenta, three vessel cord.  Normal uterus, fallopian tubes and ovaries bilaterally.   ANESTHESIA:    Spinal INTRAVENOUS FLUIDS:2800 ml ESTIMATED BLOOD LOSS: 700 ml URINE OUTPUT:  200 ml SPECIMENS: Placenta sent to L&D COMPLICATIONS: None immediate  PROCEDURE IN DETAIL:  The patient received intravenous antibiotics and had sequential compression devices applied to her lower extremities while in the preoperative area.  She was then taken to the operating room where anesthesia was induced and was found to be adequate. A foley catheter was placed into her bladder and attached to Frances Cohen gravity. She was then placed in a dorsal supine position with a leftward tilt, and prepped and draped in a sterile manner. After an adequate  timeout was performed, a Pfannenstiel skin incision was made with scalpel and carried through to the underlying layer of fascia. The fascia was incised in the midline and this incision was extended bilaterally using the Mayo scissors. Kocher clamps were applied to the superior aspect of the fascial incision and the underlying rectus muscles were dissected off bluntly. A similar process was carried out on the inferior aspect of the facial incision. The rectus muscles were separated in the midline bluntly and the peritoneum was entered bluntly. Attention was turned to the lower uterine segment where a bladder flap was created, and a transverse hysterotomy was made with a scalpel and extended bilaterally bluntly. The bladder blade was then removed. The infant was successfully delivered, and cord was clamped and cut and infant was handed over to awaiting neonatology team. Uterine massage was then administered and the placenta delivered intact with three-vessel cord. The uterus was cleared of clot and debris.  The hysterotomy was closed with 0 Vicryl in a running locked fashion, and an imbricating layer was also placed with a 0 Vicryl. Overall, excellent hemostasis was noted. The abdomen and the pelvis were copiously irrigated and cleared of all clot and debris. Hemostasis was confirmed on all surfaces.  The peritoneum and the muscles were reapproximated using 0 vicryl interrupted stitches. The fascia was then closed using 0 Vicryl in a running locked fashion.  The skin was closed with 4-0 Vicryl in a subcuticular faschion. The patient tolerated the procedure well. Sponge, lap, instrument and needle counts were correct x 2. She was taken to the recovery room in stable condition.    Frances Cohen,PEGGYMD  12/10/2011 4:01 PM

## 2011-12-10 NOTE — Transfer of Care (Signed)
Immediate Anesthesia Transfer of Care Note  Patient: Frances Cohen  Procedure(s) Performed: Procedure(s) (LRB): CESAREAN SECTION (N/A)  Patient Location: PACU  Anesthesia Type: Spinal  Level of Consciousness: awake, alert  and oriented  Airway & Oxygen Therapy: Patient Spontanous Breathing  Post-op Assessment: Report given to PACU RN and Post -op Vital signs reviewed and stable  Post vital signs: stable  Complications: No apparent anesthesia complications

## 2011-12-11 LAB — CBC
MCV: 91.3 fL (ref 78.0–100.0)
Platelets: 227 10*3/uL (ref 150–400)
RBC: 3.57 MIL/uL — ABNORMAL LOW (ref 3.87–5.11)
WBC: 10.2 10*3/uL (ref 4.0–10.5)

## 2011-12-11 NOTE — Progress Notes (Addendum)
Subjective: Postpartum Day 1: Cesarean Delivery Patient reports tolerating PO, + flatus and no problems voiding.    Objective: Vital signs in last 24 hours: Temp:  [97.4 F (36.3 C)-98 F (36.7 C)] 98 F (36.7 C) (05/17 0456) Pulse Rate:  [57-101] 66  (05/17 0456) Resp:  [15-98] 18  (05/17 0456) BP: (97-120)/(56-79) 102/66 mmHg (05/17 0456) SpO2:  [94 %-100 %] 98 % (05/17 0456) Weight:  [76.204 kg (168 lb)] 76.204 kg (168 lb) (05/16 1740)  Physical Exam:  General: alert, cooperative, appears stated age and no distress Lochia: appropriate Uterine Fundus: firm Incision: healing well, no significant drainage, no dehiscence, no significant erythema DVT Evaluation: No evidence of DVT seen on physical exam. Negative Homan's sign. No cords or calf tenderness. No significant calf/ankle edema.   Basename 12/11/11 0545  HGB 10.5*  HCT 32.6*    Assessment/Plan: Status post Cesarean section. Doing well postoperatively.  Continue current care   Frances Mews, DO [facility] Resident - PGY-1 12/11/2011 7:41 AM  Patient seen and examined.  Agree with above note.  Frances Cohen 12/11/2011 10:32 AM

## 2011-12-12 ENCOUNTER — Encounter (HOSPITAL_COMMUNITY): Payer: Self-pay | Admitting: Obstetrics and Gynecology

## 2011-12-12 NOTE — Progress Notes (Signed)
Subjective: Postpartum Day 2: Cesarean Delivery Patient reports tolerating PO, + flatus, + BM and no problems voiding.    Objective: Vital signs in last 24 hours: Temp:  [97.7 F (36.5 C)-98.3 F (36.8 C)] 98.3 F (36.8 C) (05/18 0550) Pulse Rate:  [60-70] 60  (05/18 0550) Resp:  [18-20] 18  (05/18 0550) BP: (90-109)/(57-64) 95/57 mmHg (05/18 0550) SpO2:  [95 %-98 %] 98 % (05/17 1500)  Physical Exam:  General: alert, cooperative and no distress Lochia: appropriate Uterine Fundus: firm with diastasis recti Incision: healing well, no significant drainage, no dehiscence, no significant erythema DVT Evaluation: No evidence of DVT seen on physical exam. Negative Homan's sign. No cords or calf tenderness. No significant calf/ankle edema.   Basename 12/11/11 0545  HGB 10.5*  HCT 32.6*    Assessment/Plan: Status post Cesarean section. Doing well postoperatively.  Continue current care; plan for d/c 5/19 after >48o. Lactation Consult  Andrena Mews, DO [facility] Resident - PGY-1 12/12/2011 7:27 AM  I have seen and examined this patient and agree the above assessment. CRESENZO-DISHMAN,Julicia Krieger 12/12/2011 8:36 AM

## 2011-12-13 MED ORDER — MEDROXYPROGESTERONE ACETATE 150 MG/ML IM SUSP
150.0000 mg | Freq: Once | INTRAMUSCULAR | Status: AC
  Administered 2011-12-13: 150 mg via INTRAMUSCULAR
  Filled 2011-12-13: qty 1

## 2011-12-13 MED ORDER — OXYCODONE-ACETAMINOPHEN 5-325 MG PO TABS: 1.0000 | ORAL_TABLET | ORAL | Status: AC | PRN

## 2011-12-13 MED ORDER — IBUPROFEN 600 MG PO TABS: 600.0000 mg | ORAL_TABLET | Freq: Four times a day (QID) | ORAL | Status: AC | PRN

## 2011-12-13 NOTE — Discharge Summary (Signed)
Obstetric Discharge Summary Reason for Admission: cesarean section Prenatal Procedures: none Intrapartum Procedures: cesarean: low cervical, transverse Postpartum Procedures: none Complications-Operative and Postpartum: none Hemoglobin  Date Value Range Status  12/11/2011 10.5* 12.0-15.0 (g/dL) Final     HCT  Date Value Range Status  12/11/2011 32.6* 36.0-46.0 (%) Final    Physical Exam:  General: Well-appearing, no acute distress.  Uterine Fundus: firm, below umbilicus.  Incision: clean, dry, intact. Steristrips in place. No erythema or drainage.  DVT Evaluation: no calf tenderness, cord, or pitting edema.  Discharge Diagnoses: Term Pregnancy-delivered  Discharge Information: Date: 12/13/2011 Activity: unrestricted Diet: routine Medications: Ibuprofen and Percocet Condition: stable Instructions: refer to practice specific booklet Discharge to: home   Newborn Data: Live born female  Birth Weight: 7 lb 14.8 oz (3595 g) APGAR: 8, 9  Home with mother.  oat-judge, Krew Hortman 12/13/2011, 7:49 AM

## 2011-12-13 NOTE — Progress Notes (Signed)
Subjective: Postpartum Day 3: Cesarean Delivery Patient reports no complaints.  Pain well-controlled on PO meds.  Ambulating.  Voiding.  +flatus.  Tolerating regular diet.  Lochia less than menses.   Breast feeding going well.  Objective: Vital signs in last 24 hours: Temp:  [97.9 F (36.6 C)-98.4 F (36.9 C)] 98.4 F (36.9 C) (05/19 0538) Pulse Rate:  [69-88] 69  (05/19 0538) Resp:  [16-18] 18  (05/19 0538) BP: (99-112)/(57-69) 99/62 mmHg (05/19 0538)  Physical Exam:  General: Well-appearing, no acute distress. Uterine Fundus: firm, below umbilicus. Incision: clean, dry, intact.  Steristrips in place.  No erythema or drainage. DVT Evaluation: no calf tenderness, cord, or pitting edema.   Basename 12/11/11 0545  HGB 10.5*  HCT 32.6*    Assessment/Plan: 25yo [id number] now POD#3 s/p scheduled RLTCS, doing well.  -Pain controlled with PO meds. -Breast feeding going well. -Plans depo-provera for contraception. -Anticipate DC home today.  Chancy Hurter MD 12/13/2011, 7:38 AM

## 2011-12-13 NOTE — Progress Notes (Signed)
I have seen and examined this patient and agree the assessment by the resident, Chancy Hurter, MDCRESENZO-DISHMAN,Onna Nodal 12/13/2011 8:08 AM

## 2011-12-13 NOTE — Discharge Instructions (Signed)

## 2011-12-14 NOTE — Discharge Summary (Signed)
Agree with above note.  Camelle Henkels 12/14/2011 6:33 AM

## 2011-12-14 NOTE — Progress Notes (Signed)
Agree with above note.  Frances Cohen 12/14/2011 6:33 AM   

## 2012-01-18 ENCOUNTER — Ambulatory Visit: Payer: 59 | Admitting: Family Medicine

## 2012-01-20 ENCOUNTER — Ambulatory Visit (INDEPENDENT_AMBULATORY_CARE_PROVIDER_SITE_OTHER): Payer: 59 | Admitting: Obstetrics and Gynecology

## 2012-01-20 ENCOUNTER — Other Ambulatory Visit: Payer: Self-pay | Admitting: Obstetrics and Gynecology

## 2012-01-20 ENCOUNTER — Encounter: Payer: Self-pay | Admitting: Obstetrics and Gynecology

## 2012-01-20 DIAGNOSIS — Z124 Encounter for screening for malignant neoplasm of cervix: Secondary | ICD-10-CM

## 2012-01-20 NOTE — Progress Notes (Signed)
Patient ID: Frances Cohen, female   DOB: 04-Dec-1985, 26 y.o.   MRN: [id number] 26 yo [id number] with LMP 01/14/2012 and BMI 27 presenting today for postpartum check. Patient had an elective repeat cesarean section on 5/16 with an uncomplicated postpartum course. Patient is breastfeeding and infant is thriving. Patient denies any si/sx of postpartum depression. Patient has had sexual intercourse without any complaints and is using Depo-Provera for birth control. Patient received first injection on 5/19.  Past Medical History  Diagnosis Date  . SAB (spontaneous abortion) 12/2010    TWO  . Abnormal Pap smear 2003  . Hernia 09-2011  . No pertinent past medical history    Past Surgical History  Procedure Date  . Tonsillectomy   . Wisdom tooth extraction 2004     x4  . Cesarean section   . Cesarean section 12/10/2011    Procedure: CESAREAN SECTION;  Surgeon: Catalina Antigua, MD;  Location: [facility];  Service: Gynecology;  Laterality: N/A;   Family History  Problem Relation Age of Onset  . Diabetes Mother   . Lung cancer Paternal Aunt   . Breast cancer Paternal Grandmother 11  . Prostate cancer Paternal Grandfather    History  Substance Use Topics  . Smoking status: Never Smoker   . Smokeless tobacco: Never Used  . Alcohol Use: No   GENERAL: Well-developed, well-nourished female in no acute distress.  HEENT: Normocephalic, atraumatic. Sclerae anicteric.  NECK: Supple. Normal thyroid.  LUNGS: Clear to auscultation bilaterally.  HEART: Regular rate and rhythm. BREASTS: Symmetric in size. No palpable masses or lymphadenopathy, skin changes, or nipple drainage. ABDOMEN: Soft, nontender, nondistended. No organomegaly. Incision: no erythema, induration or drainage. Healed well PELVIC: Normal external female genitalia. Vagina is pink and rugated.  Normal discharge. Normal appearing cervix. Uterus is normal in size.  No adnexal mass or tenderness. EXTREMITIES: No cyanosis, clubbing, or edema, 2+  distal pulses.   A/P 26 yo [id number] s/p cesarean section on 5/16 here for postpartum check - pap smear collected - patient doing well - patient medically cleared to resume all activities of daily living - Patient to return in August for Depo-Provera

## 2012-01-20 NOTE — Progress Notes (Signed)
Patient is here for pp follow up, she is doing well.

## 2012-01-27 LAB — PAP LB, RFX HPV ASCU

## 2012-03-04 ENCOUNTER — Ambulatory Visit: Payer: 59 | Admitting: Obstetrics & Gynecology

## 2012-03-11 ENCOUNTER — Ambulatory Visit: Payer: 59 | Admitting: Obstetrics and Gynecology

## 2012-03-17 ENCOUNTER — Ambulatory Visit (INDEPENDENT_AMBULATORY_CARE_PROVIDER_SITE_OTHER): Payer: 59 | Admitting: Gynecology

## 2012-03-17 DIAGNOSIS — Z3049 Encounter for surveillance of other contraceptives: Secondary | ICD-10-CM

## 2012-03-17 DIAGNOSIS — IMO0001 Reserved for inherently not codable concepts without codable children: Secondary | ICD-10-CM

## 2012-03-17 MED ORDER — MEDROXYPROGESTERONE ACETATE 150 MG/ML IM SUSP: 150.0000 mg | INTRAMUSCULAR | Status: DC

## 2012-03-17 MED ORDER — MEDROXYPROGESTERONE ACETATE 150 MG/ML IM SUSP
150.0000 mg | INTRAMUSCULAR | Status: DC
  Administered 2012-03-17 – 2013-08-14 (×2): 150 mg via INTRAMUSCULAR

## 2012-03-17 NOTE — Patient Instructions (Signed)
Patient is to follow up in 3 months for next depo injection. Patient also aware to pick up her next next prescription for her depo provera at her pharmacy. Prescription sent to [facility] in [place] per patient request.

## 2012-03-18 ENCOUNTER — Ambulatory Visit: Payer: 59 | Admitting: Obstetrics and Gynecology

## 2012-05-13 ENCOUNTER — Encounter: Payer: Self-pay | Admitting: Obstetrics and Gynecology

## 2012-05-19 ENCOUNTER — Ambulatory Visit: Payer: 59 | Admitting: Obstetrics & Gynecology

## 2012-05-19 DIAGNOSIS — Z01419 Encounter for gynecological examination (general) (routine) without abnormal findings: Secondary | ICD-10-CM

## 2012-06-14 ENCOUNTER — Encounter: Payer: Self-pay | Admitting: Obstetrics & Gynecology

## 2012-06-14 ENCOUNTER — Ambulatory Visit (INDEPENDENT_AMBULATORY_CARE_PROVIDER_SITE_OTHER): Payer: 59 | Admitting: Obstetrics & Gynecology

## 2012-06-14 VITALS — BP 129/83 | HR 81 | Ht 67.0 in | Wt 150.0 lb

## 2012-06-14 DIAGNOSIS — Z3042 Encounter for surveillance of injectable contraceptive: Secondary | ICD-10-CM

## 2012-06-14 DIAGNOSIS — Z0000 Encounter for general adult medical examination without abnormal findings: Secondary | ICD-10-CM

## 2012-06-14 DIAGNOSIS — Z3049 Encounter for surveillance of other contraceptives: Secondary | ICD-10-CM

## 2012-06-14 DIAGNOSIS — N898 Other specified noninflammatory disorders of vagina: Secondary | ICD-10-CM

## 2012-06-14 DIAGNOSIS — B373 Candidiasis of vulva and vagina: Secondary | ICD-10-CM

## 2012-06-14 MED ORDER — MEDROXYPROGESTERONE ACETATE 150 MG/ML IM SUSP
150.0000 mg | Freq: Once | INTRAMUSCULAR | Status: AC
  Administered 2012-06-14: 150 mg via INTRAMUSCULAR

## 2012-06-14 MED ORDER — MEDROXYPROGESTERONE ACETATE 150 MG/ML IM SUSP: 150.0000 mg | INTRAMUSCULAR | Status: DC

## 2012-06-14 NOTE — Progress Notes (Signed)
History:  26 y.o. [id number] here today for annual physical exam. She had her postpartum visit in 12/2011 and had a normal pap smear then.   She just needs a regular physical exam for insurance purposes. She also is here for Depo Provera injection.    Patient also reports having itchy, white, vaginal discharge x 2 days and wants to be evaluated. No other symptoms.  The following portions of the patient's history were reviewed and updated as appropriate: allergies, current medications, past family history, past medical history, past social history, past surgical history and problem list.  Review of Systems:  Pertinent items are noted in HPI.  Objective:  Physical Exam Blood pressure 129/83, pulse 81, height 5\' 7"  (1.702 m), weight 150 lb (68.04 kg), currently breastfeeding. GENERAL: Well-developed, well-nourished female in no acute distress.  HEENT: Normocephalic, atraumatic. Sclerae anicteric.  NECK: Supple. Normal thyroid.  LUNGS: Clear to auscultation bilaterally.  HEART: Regular rate and rhythm. BREASTS: Symmetric in size. No masses, skin changes, nipple drainage, or lymphadenopathy. ABDOMEN: Soft, nontender, nondistended. No organomegaly. PELVIC: Normal external female genitalia. Vagina is pink and rugated.  Small of white discharge seen, sample taken for wet prep. Normal cervix contour. Uterus is normal in size. No adnexal mass or tenderness.  EXTREMITIES: No cyanosis, clubbing, or edema, 2+ distal pulses.  Assessment & Plan:  Normal physical examination Will follow up wet prep results and manage accordingly Will receive Depo Provera injection, continue every 3 month injections for contraception Routine preventative health maintenance measures emphasized

## 2012-06-14 NOTE — Patient Instructions (Addendum)
    How Do I Sign Up? 1. In your Internet browser, go to [employer] and enter [email or web address]. 2. Click on the Sign Up Now link in the Sign In box. You will see the New Member Sign Up page. 3. Enter your MyChart Access Code exactly as it appears below. You will not need to use this code after you've completed the sign-up process. If you do not sign up before the expiration date, you must request a new code. MyChart Access Code: [email or web address] Expires: 07/14/2012  2:23 PM  4. Enter your Social Security Number ([id number]) and Date of Birth (mm/dd/yyyy) as indicated and click Submit. You will be taken to the next sign-up page. 5. Create a MyChart ID. This will be your MyChart login ID and cannot be changed, so think of one that is secure and easy to remember. 6. Create a MyChart password. You can change your password at any time. 7. Enter your Password Reset Question and Answer. This can be used at a later time if you forget your password.  8. Enter your e-mail address. You will receive e-mail notification when new information is available in MyChart. 9. Click Sign Up. You can now view your medical record.   Additional Information Remember, MyChart is NOT to be used for urgent needs. For medical emergencies, dial 911.

## 2012-06-15 ENCOUNTER — Ambulatory Visit: Payer: 59 | Admitting: Obstetrics and Gynecology

## 2012-06-15 LAB — WET PREP, GENITAL
Clue Cells Wet Prep HPF POC: NONE SEEN
Trich, Wet Prep: NONE SEEN

## 2012-06-16 MED ORDER — FLUCONAZOLE 150 MG PO TABS: 150.0000 mg | ORAL_TABLET | Freq: Once | ORAL | Status: DC

## 2012-06-16 NOTE — Addendum Note (Signed)
Addended by: Jaynie Collins A on: 06/16/2012 11:28 AM   Modules accepted: Orders

## 2012-06-20 ENCOUNTER — Telehealth: Payer: Self-pay | Admitting: *Deleted

## 2012-06-20 DIAGNOSIS — B373 Candidiasis of vulva and vagina: Secondary | ICD-10-CM

## 2012-06-20 DIAGNOSIS — N898 Other specified noninflammatory disorders of vagina: Secondary | ICD-10-CM

## 2012-06-20 MED ORDER — FLUCONAZOLE 150 MG PO TABS: 150.0000 mg | ORAL_TABLET | Freq: Once | ORAL | Status: DC

## 2012-07-11 NOTE — Telephone Encounter (Signed)
Results given.

## 2012-09-14 ENCOUNTER — Ambulatory Visit: Payer: 59

## 2012-09-15 ENCOUNTER — Ambulatory Visit (INDEPENDENT_AMBULATORY_CARE_PROVIDER_SITE_OTHER): Payer: 59 | Admitting: *Deleted

## 2012-09-15 DIAGNOSIS — Z3042 Encounter for surveillance of injectable contraceptive: Secondary | ICD-10-CM

## 2012-09-15 DIAGNOSIS — Z3049 Encounter for surveillance of other contraceptives: Secondary | ICD-10-CM

## 2012-09-15 MED ORDER — MEDROXYPROGESTERONE ACETATE 150 MG/ML IM SUSP
150.0000 mg | Freq: Once | INTRAMUSCULAR | Status: AC
  Administered 2012-09-15: 150 mg via INTRAMUSCULAR

## 2012-12-08 ENCOUNTER — Ambulatory Visit (INDEPENDENT_AMBULATORY_CARE_PROVIDER_SITE_OTHER): Payer: 59 | Admitting: *Deleted

## 2012-12-08 DIAGNOSIS — Z3042 Encounter for surveillance of injectable contraceptive: Secondary | ICD-10-CM

## 2012-12-08 DIAGNOSIS — Z3049 Encounter for surveillance of other contraceptives: Secondary | ICD-10-CM

## 2012-12-08 MED ORDER — MEDROXYPROGESTERONE ACETATE 150 MG/ML IM SUSP
150.0000 mg | Freq: Once | INTRAMUSCULAR | Status: AC
  Administered 2012-12-08: 150 mg via INTRAMUSCULAR

## 2012-12-08 NOTE — Progress Notes (Signed)
Patient is here today for her Depo provera injection.  She is doing well with it and will return in 3 months for her next injection.

## 2013-02-27 ENCOUNTER — Ambulatory Visit (INDEPENDENT_AMBULATORY_CARE_PROVIDER_SITE_OTHER): Payer: 59 | Admitting: *Deleted

## 2013-02-27 DIAGNOSIS — Z3049 Encounter for surveillance of other contraceptives: Secondary | ICD-10-CM

## 2013-02-27 MED ORDER — MEDROXYPROGESTERONE ACETATE 150 MG/ML IM SUSP
150.0000 mg | Freq: Once | INTRAMUSCULAR | Status: AC
  Administered 2013-02-27: 150 mg via INTRAMUSCULAR

## 2013-03-14 ENCOUNTER — Ambulatory Visit: Payer: 59

## 2013-05-05 ENCOUNTER — Emergency Department (HOSPITAL_COMMUNITY): Payer: 59

## 2013-05-05 ENCOUNTER — Other Ambulatory Visit: Payer: Self-pay

## 2013-05-05 ENCOUNTER — Emergency Department (HOSPITAL_COMMUNITY)
Admission: EM | Admit: 2013-05-05 | Discharge: 2013-05-05 | Disposition: A | Discharge status: Home / Self Care | Payer: 59 | Attending: Emergency Medicine | Admitting: Emergency Medicine

## 2013-05-05 ENCOUNTER — Encounter (HOSPITAL_COMMUNITY): Payer: Self-pay | Admitting: Emergency Medicine

## 2013-05-05 ENCOUNTER — Emergency Department (HOSPITAL_COMMUNITY)
Admission: EM | Admit: 2013-05-05 | Discharge: 2013-05-05 | Disposition: A | Discharge status: Home / Self Care | Payer: 59 | Source: Home / Self Care | Attending: Emergency Medicine | Admitting: Emergency Medicine

## 2013-05-05 DIAGNOSIS — R071 Chest pain on breathing: Secondary | ICD-10-CM

## 2013-05-05 DIAGNOSIS — R0781 Pleurodynia: Secondary | ICD-10-CM

## 2013-05-05 DIAGNOSIS — R52 Pain, unspecified: Secondary | ICD-10-CM | POA: Insufficient documentation

## 2013-05-05 DIAGNOSIS — Z79899 Other long term (current) drug therapy: Secondary | ICD-10-CM | POA: Insufficient documentation

## 2013-05-05 DIAGNOSIS — Z880 Allergy status to penicillin: Secondary | ICD-10-CM | POA: Insufficient documentation

## 2013-05-05 DIAGNOSIS — R0789 Other chest pain: Secondary | ICD-10-CM

## 2013-05-05 DIAGNOSIS — R209 Unspecified disturbances of skin sensation: Secondary | ICD-10-CM | POA: Insufficient documentation

## 2013-05-05 DIAGNOSIS — Z8719 Personal history of other diseases of the digestive system: Secondary | ICD-10-CM | POA: Insufficient documentation

## 2013-05-05 LAB — BASIC METABOLIC PANEL
Calcium: 9.4 mg/dL (ref 8.4–10.5)
GFR calc non Af Amer: >90 mL/min (ref >90)
Glucose, Bld: 76 mg/dL (ref 70–99)
Sodium: 140 mEq/L (ref 135–145)

## 2013-05-05 LAB — CBC
MCH: 32.2 pg (ref 26.0–34.0)
MCHC: 35.8 g/dL (ref 30.0–36.0)
Platelets: 231 10*3/uL (ref 150–400)

## 2013-05-05 LAB — D-DIMER, QUANTITATIVE (NOT AT ARMC): D-Dimer, Quant: <0.27 ug/mL-FEU (ref 0.00–0.48)

## 2013-05-05 MED ORDER — NAPROXEN 250 MG PO TABS
375.0000 mg | ORAL_TABLET | Freq: Once | ORAL | Status: AC
  Administered 2013-05-05: 375 mg via ORAL
  Filled 2013-05-05: qty 2

## 2013-05-05 MED ORDER — NAPROXEN 375 MG PO TABS: 375.0000 mg | ORAL_TABLET | Freq: Two times a day (BID) | ORAL | Status: DC

## 2013-05-05 NOTE — ED Notes (Signed)
Presents with sudden onset of right sided chest pain worse with deep inspiration,  radiation down right arm and right hand numbness . Denies dizziness, palpitations, SOB, nausea. Denies recent travel.  Sent from [facility] to r/o PE

## 2013-05-05 NOTE — ED Provider Notes (Signed)
CSN: [id number]     Arrival date & time 05/05/13  1947 History   First MD Initiated Contact with Patient 05/05/13 2100     Chief Complaint  Patient presents with  . Chest Pain   (Consider location/radiation/quality/duration/timing/severity/associated sxs/prior Treatment) HPI Comments:  27 year old female who experienced sudden onset of pleuritic, right pectoral chest pain today at around 5:45 PM while she was with her children, watching a movie on TV. The pain radiated down her entire right arm and into the hand and also into her right hip. She noticed some numbness and tingling in the arm and some weakness as well. The pain was pleuritic and worse with deep inspiration also with movement. She denies any injury to the area. She has not had fever, chills, URI symptoms, shortness of breath, coughing, hemoptysis, or wheezing. She denies any palpitations, dizziness, syncope, or GI symptoms. She's had no leg pain or swelling. She denies any personal or family history of DVT or thrombophlebitis. She has had no long car or plane trips. Patient states she works from home and sits for long hours at a time. She takes Depo-Provera as birth control. She has 2 small children at home, ages one and 3, that she carries around mostly with her right arm.  Patient is a 27 y.o. female presenting with chest pain. The history is provided by the patient.  Chest Pain Pain location:  R chest Pain quality: aching   Pain radiates to:  R shoulder and R arm Pain radiates to the back: no   Pain severity:  Moderate Onset quality:  Sudden Duration:  2 hours Timing:  Constant Progression:  Unchanged Chronicity:  New Context: lifting, movement and raising an arm   Context: not breathing, no drug use, not eating, no intercourse, not at rest, no stress and no trauma   Worsened by:  Nothing tried Associated symptoms: no abdominal pain, no back pain, no cough, no diaphoresis, no dizziness, no lower extremity edema, no nausea,  no numbness, no palpitations, no shortness of breath and no weakness   Risk factors: birth control   Risk factors: no diabetes mellitus, no high cholesterol, no hypertension, no immobilization, not obese, not pregnant, no prior DVT/PE, no smoking and no surgery     Past Medical History  Diagnosis Date  . SAB (spontaneous abortion) 12/2010    TWO  . Abnormal Pap smear 2003  . Hernia 09-2011  . No pertinent past medical history    Past Surgical History  Procedure Laterality Date  . Tonsillectomy    . Wisdom tooth extraction  2004     x4  . Cesarean section    . Cesarean section  12/10/2011    Procedure: CESAREAN SECTION;  Surgeon: Catalina Antigua, MD;  Location: [facility];  Service: Gynecology;  Laterality: N/A;   Family History  Problem Relation Age of Onset  . Diabetes Mother   . Lung cancer Paternal Aunt   . Breast cancer Paternal Grandmother 3  . Prostate cancer Paternal Grandfather    History  Substance Use Topics  . Smoking status: Never Smoker   . Smokeless tobacco: Never Used  . Alcohol Use: No   OB History   Grav Para Term Preterm Abortions TAB SAB Ect Mult Living   4 2 2  2  1   2      Review of Systems  Unable to perform ROS Constitutional: Negative for diaphoresis.  Respiratory: Negative for cough and shortness of breath.   Cardiovascular: Positive for  chest pain. Negative for palpitations and leg swelling.  Gastrointestinal: Negative for nausea and abdominal pain.  Genitourinary: Negative for dysuria and flank pain.  Musculoskeletal: Negative for back pain.  Skin: Negative for rash and wound.  Neurological: Negative for dizziness, weakness and numbness.  All other systems reviewed and are negative.    Allergies  Penicillins and Shellfish allergy  Home Medications   Current Outpatient Rx  Name  Route  Sig  Dispense  Refill  . medroxyPROGESTERone (DEPO-PROVERA) 150 MG/ML injection   Intramuscular   Inject 1 mL (150 mg total) into the muscle every 3  (three) months.   1 mL   4   . naproxen (NAPROSYN) 375 MG tablet   Oral   Take 1 tablet (375 mg total) by mouth 2 (two) times daily.   60 tablet   0    BP 104/76  Pulse 81  Temp(Src) 98.4 F (36.9 C) (Oral)  Resp 16  Wt 150 lb (68.04 kg)  BMI 23.49 kg/m2  SpO2 97% Physical Exam  Constitutional: She is oriented to person, place, and time. She appears well-developed and well-nourished. No distress.  HENT:  Head: Normocephalic and atraumatic.  Eyes: Pupils are equal, round, and reactive to light.  Neck: Normal range of motion.  Cardiovascular: Regular rhythm.   Pulmonary/Chest: She exhibits tenderness.  Abdominal: Soft. She exhibits no distension.  Musculoskeletal: Normal range of motion. She exhibits no edema and no tenderness.  Neurological: She is oriented to person, place, and time.  Skin: No rash noted. She is not diaphoretic.    ED Course  Procedures (including critical care time) Labs Review Labs Reviewed  CBC - Abnormal; Notable for the following:    RBC 5.21 (*)    Hemoglobin 16.8 (*)    HCT 46.9 (*)    All other components within normal limits  BASIC METABOLIC PANEL  D-DIMER, QUANTITATIVE   Imaging Review Dg Chest 2 View  05/05/2013   CLINICAL DATA:  Chest pain.  EXAM: CHEST  2 VIEW  COMPARISON:  None.  FINDINGS: The heart size and mediastinal contours are within normal limits. Both lungs are clear. The visualized skeletal structures are unremarkable.  IMPRESSION: No active cardiopulmonary disease.   Electronically Signed   By: Roque Lias M.D.   On: 05/05/2013 21:02    EKG Interpretation   None      ED ECG REPORT   Date: 05/05/2013  EKG Time: 9:56 PM  Rate: 69  Rhythm: normal sinus rhythm,  No old   Axis: normal  Intervals:none  ST&T Change: none  Narrative Interpretation: normal           MDM   1. Acute chest wall pain     Is a 27 year old active female, who works from home, having 2 small children, ages one and 3.  She does take  Depo-Provera as a birth control.  Denies any long trips early, DVT.  Risk factor is that she takes Depo-Provera as a birth control method.  She denies any shortness of breath, diaphoresis, leg swelling, history of DVT.  Her labs are reviewed including d-dimer, which is within normal parameters.  I do not feel this patient has a pulmonary embolus.  This has been discussed with her.  She has reproducible chest pain.  On exam.  She will be started on Naprosyn 375 mg twice a day for the next 2 weeks, and then as needed.  Thereafter    Arman Filter, NP 05/05/13 2156

## 2013-05-05 NOTE — ED Provider Notes (Signed)
Chief Complaint:   Chief Complaint  Patient presents with  . Shortness of Breath    History of Present Illness:   Frances Cohen is a 27 year old female who experienced sudden onset of pleuritic, right pectoral chest pain today at around 5:45 PM while she was with her children, watching a movie on TV. The pain radiated down her entire right arm and into the hand and also into her right hip. She noticed some numbness and tingling in the arm and some weakness as well. The pain was pleuritic and worse with deep inspiration also with movement. She denies any injury to the area. She has not had fever, chills, URI symptoms, shortness of breath, coughing, hemoptysis, or wheezing. She denies any palpitations, dizziness, syncope, or GI symptoms. She's had no leg pain or swelling. She denies any personal or family history of DVT or thrombophlebitis. She has had no long car or plane trips. Patient states she works from home and sits for long hours at a time. She takes Depo-Provera as birth control.  Review of Systems:  Other than noted above, the patient denies any of the following symptoms. Systemic:  No fever, chills, sweats, or fatigue. ENT:  No nasal congestion, rhinorrhea, or sore throat. Pulmonary:  No cough, wheezing, shortness of breath, sputum production, hemoptysis. Cardiac:  No palpitations, rapid heartbeat, dizziness, presyncope or syncope. GI:  No abdominal pain, heartburn, nausea, or vomiting. Ext:  No leg pain or swelling.  PMFSH:  Past medical history, family history, social history, meds, and allergies were reviewed and updated as needed. She is allergic to penicillin and shellfish.  Physical Exam:   Vital signs:  BP 105/68  Pulse 74  Temp(Src) 98.3 F (36.8 C) (Oral)  Resp 10  SpO2 100% Gen:  Alert, oriented, in no distress, skin warm and dry. Eye:  PERRL, lids and conjunctivas normal.  Sclera non-icteric. ENT:  Mucous membranes moist, pharynx clear. Neck:  Supple, no adenopathy  or tenderness.  No JVD. Lungs:  Clear to auscultation, no wheezes, rales or rhonchi.  No respiratory distress. Heart:  Regular rhythm.  No gallops, murmers, clicks or rubs. Chest:  No chest wall tenderness. Abdomen:  Soft, nontender, no organomegaly or mass.  Bowel sounds normal.  No pulsatile abdominal mass or bruit. Ext:  No edema.  No calf tenderness and Homann's sign negative.  Pulses full and equal. Skin:  Warm and dry.  No rash.  Assessment:  The encounter diagnosis was Pleuritic chest pain.  The history is concerning for pulmonary embolism. She needs a chest CT scan. She'll be transferred to the emergency department via shuttle.  Plan:   The patient was transferred to the ED via shuttle in stable condition.  Medical Decision Making:  27 year old, otherwise healthy, female experienced sudden onset of pleuritic right pectoral chest pain today at 5:45 p.m.  No obvious precipitating cause.  No shortness of breath, cough or hemoptysis.  No leg pain or swelling or history of DVT or PE either in self or family.  Pain radiates down right arm and to right hip.  Suspicious for PE.    Reuben Likes, MD 05/05/13 [phone]

## 2013-05-05 NOTE — ED Notes (Signed)
**  Blood was drawn by EMT [place]**

## 2013-05-05 NOTE — ED Notes (Signed)
C/o aprox 2 H PTA, she had sharp sensation of pain in right side of chest, shoulder, pain and numbness in right arm. Took a shower in an effort to relieve her syx w/o result; pain worse w deep breath

## 2013-05-05 NOTE — ED Notes (Signed)
Pt c/o rt sided cp that radiates to rt arm that started around 6pm. Pt denies any activity that could have brought on the pain. Pt describes pain as a soreness, like if she had been coughing a lot. Pt rates pain 6/10. Pt denies taking any medication for pain prior to arrival.

## 2013-05-06 NOTE — ED Provider Notes (Signed)
Medical screening examination/treatment/procedure(s) were performed by non-physician practitioner and as supervising physician I was immediately available for consultation/collaboration.   Richardean Canal, MD 05/06/13 [phone]

## 2013-05-24 ENCOUNTER — Ambulatory Visit (INDEPENDENT_AMBULATORY_CARE_PROVIDER_SITE_OTHER): Payer: 59 | Admitting: *Deleted

## 2013-05-24 DIAGNOSIS — Z3042 Encounter for surveillance of injectable contraceptive: Secondary | ICD-10-CM

## 2013-05-24 DIAGNOSIS — Z3049 Encounter for surveillance of other contraceptives: Secondary | ICD-10-CM

## 2013-05-24 MED ORDER — MEDROXYPROGESTERONE ACETATE 150 MG/ML IM SUSP
150.0000 mg | Freq: Once | INTRAMUSCULAR | Status: AC
  Administered 2013-05-24: 150 mg via INTRAMUSCULAR

## 2013-05-24 NOTE — Progress Notes (Signed)
Pt here today for her Depo Provera injection.

## 2013-06-01 ENCOUNTER — Other Ambulatory Visit: Payer: Self-pay

## 2013-08-14 ENCOUNTER — Ambulatory Visit (INDEPENDENT_AMBULATORY_CARE_PROVIDER_SITE_OTHER): Payer: 59 | Admitting: *Deleted

## 2013-08-14 DIAGNOSIS — Z3042 Encounter for surveillance of injectable contraceptive: Secondary | ICD-10-CM

## 2013-08-14 DIAGNOSIS — Z3049 Encounter for surveillance of other contraceptives: Secondary | ICD-10-CM

## 2013-08-14 DIAGNOSIS — R071 Chest pain on breathing: Secondary | ICD-10-CM

## 2013-11-06 ENCOUNTER — Ambulatory Visit: Payer: 59

## 2013-11-07 ENCOUNTER — Ambulatory Visit (INDEPENDENT_AMBULATORY_CARE_PROVIDER_SITE_OTHER): Payer: 59 | Admitting: Obstetrics & Gynecology

## 2013-11-07 ENCOUNTER — Encounter: Payer: Self-pay | Admitting: Obstetrics & Gynecology

## 2013-11-07 ENCOUNTER — Telehealth: Payer: Self-pay | Admitting: *Deleted

## 2013-11-07 ENCOUNTER — Ambulatory Visit: Payer: 59

## 2013-11-07 VITALS — BP 101/76 | HR 77 | Ht 67.0 in | Wt 151.0 lb

## 2013-11-07 DIAGNOSIS — Z3009 Encounter for other general counseling and advice on contraception: Secondary | ICD-10-CM

## 2013-11-07 DIAGNOSIS — IMO0001 Reserved for inherently not codable concepts without codable children: Secondary | ICD-10-CM

## 2013-11-07 MED ORDER — NORGESTIMATE-ETH ESTRADIOL 0.25-35 MG-MCG PO TABS: 1.0000 | ORAL_TABLET | Freq: Every day | ORAL | Status: DC

## 2013-11-07 MED ORDER — LEVONORGEST-ETH ESTRAD 91-DAY 0.15-0.03 MG PO TABS: 1.0000 | ORAL_TABLET | Freq: Every day | ORAL | Status: DC

## 2013-11-07 NOTE — Progress Notes (Signed)
   Subjective:    Patient ID: Frances Cohen, female    DOB: 02/24/1986, 28 y.o.   MRN: [id number]  HPI  Frances Cohen is here because she wants to switch from depo provera to an OCP. She is worried about bone loss. However, she is not happy to start having periods again.  Review of Systems     Objective:   Physical Exam        Assessment & Plan:  Contraception with desire for few periods- seasonale prescribed RTC 1-2 months for a BP check

## 2013-11-07 NOTE — Progress Notes (Signed)
Here today to discuss changing BC, currently on Depo.

## 2013-11-07 NOTE — Telephone Encounter (Signed)
Patient is not able to afford the cost of the seasonalle even if it is called as the generic.  She would like an ortho product called in as this will be covered at 100% for her.

## 2013-12-11 ENCOUNTER — Emergency Department (HOSPITAL_COMMUNITY)
Admission: EM | Admit: 2013-12-11 | Discharge: 2013-12-11 | Disposition: A | Discharge status: Home / Self Care | Payer: 59 | Attending: Emergency Medicine | Admitting: Emergency Medicine

## 2013-12-11 ENCOUNTER — Encounter (HOSPITAL_COMMUNITY): Payer: Self-pay | Admitting: Emergency Medicine

## 2013-12-11 DIAGNOSIS — Z8719 Personal history of other diseases of the digestive system: Secondary | ICD-10-CM | POA: Insufficient documentation

## 2013-12-11 DIAGNOSIS — Z880 Allergy status to penicillin: Secondary | ICD-10-CM | POA: Insufficient documentation

## 2013-12-11 DIAGNOSIS — K029 Dental caries, unspecified: Secondary | ICD-10-CM

## 2013-12-11 DIAGNOSIS — G43909 Migraine, unspecified, not intractable, without status migrainosus: Secondary | ICD-10-CM | POA: Insufficient documentation

## 2013-12-11 DIAGNOSIS — G44209 Tension-type headache, unspecified, not intractable: Secondary | ICD-10-CM

## 2013-12-11 HISTORY — DX: Migraine, unspecified, not intractable, without status migrainosus: G43.909

## 2013-12-11 MED ORDER — FENTANYL CITRATE 0.05 MG/ML IJ SOLN
50.0000 ug | Freq: Once | INTRAMUSCULAR | Status: AC
  Administered 2013-12-11: 50 ug via NASAL
  Filled 2013-12-11: qty 2

## 2013-12-11 MED ORDER — TRAMADOL HCL 50 MG PO TABS
50.0000 mg | ORAL_TABLET | Freq: Four times a day (QID) | ORAL | Status: DC | PRN
Start: 2013-12-11 — End: 2014-02-15

## 2013-12-11 MED ORDER — CYCLOBENZAPRINE HCL 10 MG PO TABS: 10.0000 mg | ORAL_TABLET | Freq: Three times a day (TID) | ORAL | Status: DC | PRN

## 2013-12-11 NOTE — Discharge Instructions (Signed)
Please followup with the dentist as instructed for your dental pains and cavities. Using medication prescribed to help with your pain and headaches.    Dental Caries Dental caries is tooth decay. This decay can cause a hole in teeth (cavity) that can get bigger and deeper over time. HOME CARE  Brush and floss your teeth. Do this at least two times a day.  Use a fluoride toothpaste.  Use a mouth rinse if told by your dentist or doctor.  Eat less sugary and starchy foods. Drink less sugary drinks.  Avoid snacking often on sugary and starchy foods. Avoid sipping often on sugary drinks.  Keep regular checkups and cleanings with your dentist.  Use fluoride supplements if told by your dentist or doctor.  Allow fluoride to be applied to teeth if told by your dentist or doctor. MAKE SURE YOU:  Understand these instructions.  Will watch your condition.  Will get help right away if you are not doing well or get worse. Document Released: 04/21/2008 Document Revised: 03/15/2013 Document Reviewed: 07/15/2012 [facility] Patient Information 2014 [place], [place].   Dental Pain Toothache is pain in or around a tooth. It may get worse with chewing or with cold or heat.  HOME CARE  Your dentist may use a numbing medicine during treatment. If so, you may need to avoid eating until the medicine wears off. Ask your dentist about this.  Only take medicine as told by your dentist or doctor.  Avoid chewing food near the painful tooth until after all treatment is done. Ask your dentist about this. GET HELP RIGHT AWAY IF:   The problem gets worse or new problems appear.  You have a fever.  There is redness and puffiness (swelling) of the face, jaw, or neck.  You cannot open your mouth.  There is pain in the jaw.  There is very bad pain that is not helped by medicine. MAKE SURE YOU:   Understand these instructions.  Will watch your condition.  Will get help right away if you are not  doing well or get worse. Document Released: 12/30/2007 Document Revised: 10/05/2011 Document Reviewed: 12/30/2007 [facility] Patient Information 2014 [place], [place].   Tension Headache A tension headache is pain, pressure, or aching felt over the front and sides of the head. Tension headaches often come after stress, feeling worried (anxiety), or feeling sad or down for a while (depressed). HOME CARE  Only take medicine as told by your doctor.  Lie down in a dark, quiet room when you have a headache.  Keep a journal to find out if certain things bring on headaches. For example, write down:  What you eat and drink.  How much sleep you get.  Any change to your diet or medicines.  Relax by getting a massage or doing other relaxing activities.  Put ice or heat packs on the head and neck area as told by your doctor.  Lessen stress.  Sit up straight. Do not tighten (tense) your muscles.  Quit smoking if you smoke.  Lessen how much alcohol you drink.  Lessen how much caffeine you drink, or stop drinking caffeine.  Eat and exercise regularly.  Get enough sleep.  Avoid using too much pain medicine. GET HELP RIGHT AWAY IF:   Your headache becomes really bad.  You have a fever.  You have a stiff neck.  You have trouble seeing.  Your muscles are weak, or you lose muscle control.  You lose your balance or have trouble walking.  You feel like you will pass out (faint), or you pass out.  You have really bad symptoms that are different than your first symptoms.  You have problems with the medicines given to you by your doctor.  Your medicines do not work.  Your headache feels different than the other headaches.  You feel sick to your stomach (nauseous) or throw up (vomit). MAKE SURE YOU:   Understand these instructions.  Will watch your condition.  Will get help right away if you are not doing well or get worse. Document Released: 10/07/2009 Document Revised:  10/05/2011 Document Reviewed: 07/03/2011 [facility] Patient Information 2014 [place], [place].

## 2013-12-11 NOTE — ED Notes (Addendum)
Pt. reports migraine headache for several days unrelieved by OTC pain medications with nausea / photophobia , denies blurred vision . Alert and oriented / respirations unlabored. No fever or chills.

## 2013-12-11 NOTE — ED Provider Notes (Signed)
CSN: [id number]     Arrival date & time 12/11/13  2052 History   First MD Initiated Contact with Patient 12/11/13 2159     Chief Complaint  Patient presents with  . Migraine   HPI  History provided by the patient. The patient is a 28 year old female presenting with intermittent right-sided headaches as well as some dental pains for the past one week. She reports several episodes of pain to the right side of her head radiating to the back lasting at times several hours. She does report some mild relief by holding pressure at certain points of her scalp. She has also used Excedrin migraine headache with minimal improvements. She does report having some pains originating from her right upper molar teeth and reports having an upcoming appointment with her dentist this Saturday. She denies any associated swelling in the mouth. No fever, chills or sweats. She does occasionally have slight nausea but is otherwise eating and drinking well. No other aggravating or alleviating factors. No other associated symptoms.   Past Medical History  Diagnosis Date  . SAB (spontaneous abortion) 12/2010    TWO  . Abnormal Pap smear 2003  . Hernia 09-2011  . No pertinent past medical history   . Migraine    Past Surgical History  Procedure Laterality Date  . Tonsillectomy    . Wisdom tooth extraction  2004     x4  . Cesarean section    . Cesarean section  12/10/2011    Procedure: CESAREAN SECTION;  Surgeon: Mora Bellman, MD;  Location: [facility];  Service: Gynecology;  Laterality: N/A;   Family History  Problem Relation Age of Onset  . Diabetes Mother   . Lung cancer Paternal Aunt   . Breast cancer Paternal Grandmother 42  . Prostate cancer Paternal Grandfather    History  Substance Use Topics  . Smoking status: Never Smoker   . Smokeless tobacco: Never Used  . Alcohol Use: No   OB History   Grav Para Term Preterm Abortions TAB SAB Ect Mult Living   4 2 2  2  1   2      Review of Systems    Constitutional: Negative for fever and chills.  HENT: Positive for dental problem. Negative for drooling, trouble swallowing and voice change.   Gastrointestinal: Positive for nausea.  Neurological: Positive for headaches. Negative for dizziness and light-headedness.  All other systems reviewed and are negative.     Allergies  Penicillins and Shellfish allergy  Home Medications   Prior to Admission medications   Not on File   BP 118/88  Pulse 80  Temp(Src) 98.2 F (36.8 C) (Oral)  Resp 18  Ht 5\' 7"  (1.702 m)  Wt 149 lb (67.586 kg)  BMI 23.33 kg/m2  SpO2 98% Physical Exam  Nursing note and vitals reviewed. Constitutional: She is oriented to person, place, and time. She appears well-developed and well-nourished. No distress.  HENT:  Head: Normocephalic and atraumatic.  Mouth/Throat:    Dental caries present to the right upper first and second molar area. Mild pain to percussion this area. No significant swelling of the gums. Mild tenderness around the right TMJ. No significant pop or click.  Eyes: Conjunctivae and EOM are normal. Pupils are equal, round, and reactive to light.  Neck: Normal range of motion. Neck supple.  No meningeal signs  Cardiovascular: Normal rate and regular rhythm.   No murmur heard. Pulmonary/Chest: Effort normal and breath sounds normal. No respiratory distress. She has no  wheezes. She has no rales.  Abdominal: Soft. There is no tenderness. There is no rigidity, no rebound, no guarding, no CVA tenderness and no tenderness at McBurney's point.  Lymphadenopathy:    She has no cervical adenopathy.  Neurological: She is alert and oriented to person, place, and time. She has normal strength. No cranial nerve deficit or sensory deficit. Gait normal.  Skin: Skin is warm and dry. No rash noted.  Psychiatric: She has a normal mood and affect. Her behavior is normal.    ED Course  Procedures   COORDINATION OF CARE:  Nursing notes reviewed. Vital  signs reviewed. Initial pt interview and examination performed.   Filed Vitals:   12/11/13 2056  BP: 118/88  Pulse: 80  Temp: 98.2 F (36.8 C)  TempSrc: Oral  Resp: 18  Height: 5\' 7"  (1.702 m)  Weight: 149 lb (67.586 kg)  SpO2: 98%    10:34 PM-patient seen and evaluated. Patient reports improvements after fentanyl given in triage. patient afebrile no concerning or red flag symptoms. Normal nonfocal neuro exam.    Dental Block Performed by: Martie Lee Authorized by: Martie Lee Consent: Verbal consent obtained. Risks and benefits: risks, benefits and alternatives were discussed Consent given by: patient Patient identity confirmed: provided demographic data  Location: right upper second molar  Local anesthetic: Bupivacaine 0.5% with epinephrine  Anesthetic total: 1.8 ml  Irrigation method: syringe  Patient tolerance: Patient tolerated the procedure well with no immediate complications. Pain improved.     Treatment plan initiated: Medications  fentaNYL (SUBLIMAZE) injection 50 mcg (50 mcg Nasal Given 12/11/13 2105)       MDM   Final diagnoses:  Tension headache  Dental caries            Martie Lee, PA-C 12/11/13 2323

## 2013-12-12 NOTE — ED Provider Notes (Signed)
Medical screening examination/treatment/procedure(s) were performed by non-physician practitioner and as supervising physician I was immediately available for consultation/collaboration.   EKG Interpretation None        Blanchard Kelch, MD 12/12/13 1248

## 2014-01-02 ENCOUNTER — Telehealth: Payer: Self-pay | Admitting: *Deleted

## 2014-01-02 DIAGNOSIS — B379 Candidiasis, unspecified: Secondary | ICD-10-CM

## 2014-01-02 MED ORDER — FLUCONAZOLE 150 MG PO TABS: 150.0000 mg | ORAL_TABLET | Freq: Once | ORAL | Status: DC

## 2014-01-02 NOTE — Telephone Encounter (Signed)
Pt called and is having symptoms of a yeast infection.  I have sent in Diflucan to her pharmacy.  Pt aware.

## 2014-01-11 ENCOUNTER — Other Ambulatory Visit (INDEPENDENT_AMBULATORY_CARE_PROVIDER_SITE_OTHER): Payer: 59 | Admitting: *Deleted

## 2014-01-11 DIAGNOSIS — N912 Amenorrhea, unspecified: Secondary | ICD-10-CM

## 2014-01-11 NOTE — Progress Notes (Signed)
Patient would like to get a blood pregnancy test.  She recently stopped getting her Depo Provera injections in order to conceive and it has been over a month that she is due for her cycle.  She has had a negative home pregnancy test.  I have explained that sometimes the effects of the Depo Provera last beyond the three months that is in effect and patient is reassured.  We will await test results and if negative she will give it a few more months for her cycles to return to normal before she needs to be concerned about her fertility returning.

## 2014-01-12 LAB — HCG, QUANTITATIVE, PREGNANCY

## 2014-01-15 ENCOUNTER — Telehealth: Payer: Self-pay | Admitting: *Deleted

## 2014-01-15 NOTE — Telephone Encounter (Signed)
Patient called for lab results, notified HCG quant is negative.

## 2014-02-15 ENCOUNTER — Emergency Department (HOSPITAL_COMMUNITY)
Admission: EM | Admit: 2014-02-15 | Discharge: 2014-02-16 | Disposition: A | Discharge status: Home / Self Care | Payer: 59 | Attending: Emergency Medicine | Admitting: Emergency Medicine

## 2014-02-15 ENCOUNTER — Encounter (HOSPITAL_COMMUNITY): Payer: Self-pay | Admitting: Emergency Medicine

## 2014-02-15 DIAGNOSIS — O9989 Other specified diseases and conditions complicating pregnancy, childbirth and the puerperium: Secondary | ICD-10-CM | POA: Insufficient documentation

## 2014-02-15 DIAGNOSIS — Z8719 Personal history of other diseases of the digestive system: Secondary | ICD-10-CM | POA: Insufficient documentation

## 2014-02-15 DIAGNOSIS — Z8679 Personal history of other diseases of the circulatory system: Secondary | ICD-10-CM | POA: Insufficient documentation

## 2014-02-15 DIAGNOSIS — R102 Pelvic and perineal pain: Secondary | ICD-10-CM

## 2014-02-15 DIAGNOSIS — O039 Complete or unspecified spontaneous abortion without complication: Secondary | ICD-10-CM | POA: Insufficient documentation

## 2014-02-15 DIAGNOSIS — Z880 Allergy status to penicillin: Secondary | ICD-10-CM | POA: Insufficient documentation

## 2014-02-15 DIAGNOSIS — N949 Unspecified condition associated with female genital organs and menstrual cycle: Secondary | ICD-10-CM | POA: Insufficient documentation

## 2014-02-15 LAB — CBC WITH DIFFERENTIAL/PLATELET
BASOS PCT: 1 % (ref 0–1)
Basophils Absolute: 0 10*3/uL (ref 0.0–0.1)
Eosinophils Absolute: 0.2 10*3/uL (ref 0.0–0.7)
Eosinophils Relative: 3 % (ref 0–5)
HCT: 44.1 % (ref 36.0–46.0)
Hemoglobin: 15 g/dL (ref 12.0–15.0)
LYMPHS ABS: 3.2 10*3/uL (ref 0.7–4.0)
Lymphocytes Relative: 51 % — ABNORMAL HIGH (ref 12–46)
MCH: 31.2 pg (ref 26.0–34.0)
MCHC: 34 g/dL (ref 30.0–36.0)
MCV: 91.7 fL (ref 78.0–100.0)
MONOS PCT: 7 % (ref 3–12)
Monocytes Absolute: 0.4 10*3/uL (ref 0.1–1.0)
NEUTROS ABS: 2.4 10*3/uL (ref 1.7–7.7)
NEUTROS PCT: 38 % — AB (ref 43–77)
Platelets: 233 10*3/uL (ref 150–400)
RBC: 4.81 MIL/uL (ref 3.87–5.11)
RDW: 12.4 % (ref 11.5–15.5)
WBC: 6.2 10*3/uL (ref 4.0–10.5)

## 2014-02-15 LAB — URINALYSIS, ROUTINE W REFLEX MICROSCOPIC
Bilirubin Urine: NEGATIVE
GLUCOSE, UA: NEGATIVE mg/dL
HGB URINE DIPSTICK: NEGATIVE
Ketones, ur: 15 mg/dL — AB
Leukocytes, UA: NEGATIVE
Nitrite: NEGATIVE
Protein, ur: NEGATIVE mg/dL
SPECIFIC GRAVITY, URINE: 1.039 — AB (ref 1.005–1.030)
UROBILINOGEN UA: 1 mg/dL (ref 0.0–1.0)
pH: 5.5 (ref 5.0–8.0)

## 2014-02-15 LAB — COMPREHENSIVE METABOLIC PANEL
ALBUMIN: 4.1 g/dL (ref 3.5–5.2)
ALK PHOS: 79 U/L (ref 39–117)
ALT: 13 U/L (ref 0–35)
AST: 18 U/L (ref 0–37)
Anion gap: 13 (ref 5–15)
BILIRUBIN TOTAL: 0.4 mg/dL (ref 0.3–1.2)
BUN: 12 mg/dL (ref 6–23)
CHLORIDE: 103 meq/L (ref 96–112)
CO2: 23 mEq/L (ref 19–32)
Calcium: 9 mg/dL (ref 8.4–10.5)
Creatinine, Ser: 0.72 mg/dL (ref 0.50–1.10)
GFR calc Af Amer: >90 mL/min (ref >90)
GFR calc non Af Amer: >90 mL/min (ref >90)
GLUCOSE: 115 mg/dL — AB (ref 70–99)
POTASSIUM: 3.7 meq/L (ref 3.7–5.3)
Sodium: 139 mEq/L (ref 137–147)
Total Protein: 7.5 g/dL (ref 6.0–8.3)

## 2014-02-15 LAB — HCG, QUANTITATIVE, PREGNANCY: hCG, Beta Chain, Quant, S: <1 m[IU]/mL (ref <5)

## 2014-02-15 MED ORDER — NAPROXEN 250 MG PO TABS
500.0000 mg | ORAL_TABLET | Freq: Once | ORAL | Status: AC
  Administered 2014-02-16: 500 mg via ORAL
  Filled 2014-02-15: qty 2

## 2014-02-15 NOTE — ED Notes (Signed)
Pt. reports low abdominal pain , low back pain and vaginal pain / discharge onset today , pt. stated she had a miscarriage last Saturday , denies fever or chills.

## 2014-02-16 ENCOUNTER — Emergency Department (HOSPITAL_COMMUNITY): Payer: 59

## 2014-02-16 ENCOUNTER — Ambulatory Visit: Payer: 59 | Admitting: Obstetrics & Gynecology

## 2014-02-16 LAB — WET PREP, GENITAL
TRICH WET PREP: NONE SEEN
WBC WET PREP: NONE SEEN
Yeast Wet Prep HPF POC: NONE SEEN

## 2014-02-16 MED ORDER — NAPROXEN 500 MG PO TABS: 500.0000 mg | ORAL_TABLET | Freq: Two times a day (BID) | ORAL | Status: DC

## 2014-02-16 MED ORDER — OXYCODONE-ACETAMINOPHEN 5-325 MG PO TABS: 1.0000 | ORAL_TABLET | Freq: Four times a day (QID) | ORAL | Status: DC | PRN

## 2014-02-16 MED ORDER — OXYCODONE-ACETAMINOPHEN 5-325 MG PO TABS
2.0000 | ORAL_TABLET | Freq: Once | ORAL | Status: DC
  Filled 2014-02-16: qty 2

## 2014-02-16 NOTE — ED Notes (Signed)
Pt is driving self home- Percocet not given.

## 2014-02-16 NOTE — ED Notes (Signed)
Patient transported to Ultrasound 

## 2014-02-16 NOTE — Discharge Instructions (Signed)
Your ultrasound today did not show any specific abnormalities.  It is common to have pelvic pain after a miscarriage.  Take medications as prescribed.  Keep ear point with GYN as scheduled tomorrow.  Return emergency room for worsening condition or new concerning symptoms.   Miscarriage A miscarriage is the sudden loss of an unborn baby (fetus) before the 20th week of pregnancy. Most miscarriages happen in the first 3 months of pregnancy. Sometimes, it happens before a woman even knows she is pregnant. A miscarriage is also called a "spontaneous miscarriage" or "early pregnancy loss." Having a miscarriage can be an emotional experience. Talk with your caregiver about any questions you may have about miscarrying, the grieving process, and your future pregnancy plans. CAUSES   Problems with the fetal chromosomes that make it impossible for the baby to develop normally. Problems with the baby's genes or chromosomes are most often the result of errors that occur, by chance, as the embryo divides and grows. The problems are not inherited from the parents.  Infection of the cervix or uterus.   Hormone problems.   Problems with the cervix, such as having an incompetent cervix. This is when the tissue in the cervix is not strong enough to hold the pregnancy.   Problems with the uterus, such as an abnormally shaped uterus, uterine fibroids, or congenital abnormalities.   Certain medical conditions.   Smoking, drinking alcohol, or taking illegal drugs.   Trauma.  Often, the cause of a miscarriage is unknown.  SYMPTOMS   Vaginal bleeding or spotting, with or without cramps or pain.  Pain or cramping in the abdomen or lower back.  Passing fluid, tissue, or blood clots from the vagina. DIAGNOSIS  Your caregiver will perform a physical exam. You may also have an ultrasound to confirm the miscarriage. Blood or urine tests may also be ordered. TREATMENT   Sometimes, treatment is not  necessary if you naturally pass all the fetal tissue that was in the uterus. If some of the fetus or placenta remains in the body (incomplete miscarriage), tissue left behind may become infected and must be removed. Usually, a dilation and curettage (D and C) procedure is performed. During a D and C procedure, the cervix is widened (dilated) and any remaining fetal or placental tissue is gently removed from the uterus.  Antibiotic medicines are prescribed if there is an infection. Other medicines may be given to reduce the size of the uterus (contract) if there is a lot of bleeding.  If you have Rh negative blood and your baby was Rh positive, you will need a Rh immunoglobulin shot. This shot will protect any future baby from having Rh blood problems in future pregnancies. HOME CARE INSTRUCTIONS   Your caregiver may order bed rest or may allow you to continue light activity. Resume activity as directed by your caregiver.  Have someone help with home and family responsibilities during this time.   Keep track of the number of sanitary pads you use each day and how soaked (saturated) they are. Write down this information.   Do not use tampons. Do not douche or have sexual intercourse until approved by your caregiver.   Only take over-the-counter or prescription medicines for pain or discomfort as directed by your caregiver.   Do not take aspirin. Aspirin can cause bleeding.   Keep all follow-up appointments with your caregiver.   If you or your partner have problems with grieving, talk to your caregiver or seek counseling to help  cope with the pregnancy loss. Allow enough time to grieve before trying to get pregnant again.  SEEK IMMEDIATE MEDICAL CARE IF:   You have severe cramps or pain in your back or abdomen.  You have a fever.  You pass large blood clots (walnut-sized or larger) ortissue from your vagina. Save any tissue for your caregiver to inspect.   Your bleeding  increases.   You have a thick, bad-smelling vaginal discharge.  You become lightheaded, weak, or you faint.   You have chills.  MAKE SURE YOU:  Understand these instructions.  Will watch your condition.  Will get help right away if you are not doing well or get worse. Document Released: 01/06/2001 Document Revised: 11/07/2012 Document Reviewed: 09/01/2011 [facility] Patient Information 2015 [place], [place]. This information is not intended to replace advice given to you by your health care provider. Make sure you discuss any questions you have with your health care provider.  Pelvic Pain Female pelvic pain can be caused by many different things and start from a variety of places. Pelvic pain refers to pain that is located in the lower half of the abdomen and between your hips. The pain may occur over a short period of time (acute) or may be reoccurring (chronic). The cause of pelvic pain may be related to disorders affecting the female reproductive organs (gynecologic), but it may also be related to the bladder, kidney stones, an intestinal complication, or muscle or skeletal problems. Getting help right away for pelvic pain is important, especially if there has been severe, sharp, or a sudden onset of unusual pain. It is also important to get help right away because some types of pelvic pain can be life threatening.  CAUSES  Below are only some of the causes of pelvic pain. The causes of pelvic pain can be in one of several categories.   Gynecologic.  Pelvic inflammatory disease.  Sexually transmitted infection.  Ovarian cyst or a twisted ovarian ligament (ovarian torsion).  Uterine lining that grows outside the uterus (endometriosis).  Fibroids, cysts, or tumors.  Ovulation.  Pregnancy.  Pregnancy that occurs outside the uterus (ectopic pregnancy).  Miscarriage.  Labor.  Abruption of the placenta or ruptured uterus.  Infection.  Uterine infection  (endometritis).  Bladder infection.  Diverticulitis.  Miscarriage related to a uterine infection (septic abortion).  Bladder.  Inflammation of the bladder (cystitis).  Kidney stone(s).  Gastrointestinal.  Constipation.  Diverticulitis.  Neurologic.  Trauma.  Feeling pelvic pain because of mental or emotional causes (psychosomatic).  Cancers of the bowel or pelvis. EVALUATION  Your caregiver will want to take a careful history of your concerns. This includes recent changes in your health, a careful gynecologic history of your periods (menses), and a sexual history. Obtaining your family history and medical history is also important. Your caregiver may suggest a pelvic exam. A pelvic exam will help identify the location and severity of the pain. It also helps in the evaluation of which organ system may be involved. In order to identify the cause of the pelvic pain and be properly treated, your caregiver may order tests. These tests may include:   A pregnancy test.  Pelvic ultrasonography.  An X-ray exam of the abdomen.  A urinalysis or evaluation of vaginal discharge.  Blood tests. HOME CARE INSTRUCTIONS   Only take over-the-counter or prescription medicines for pain, discomfort, or fever as directed by your caregiver.   Rest as directed by your caregiver.   Eat a balanced diet.  Drink enough fluids to make your urine clear or pale yellow, or as directed.   Avoid sexual intercourse if it causes pain.   Apply warm or cold compresses to the lower abdomen depending on which one helps the pain.   Avoid stressful situations.   Keep a journal of your pelvic pain. Write down when it started, where the pain is located, and if there are things that seem to be associated with the pain, such as food or your menstrual cycle.  Follow up with your caregiver as directed.  SEEK MEDICAL CARE IF:  Your medicine does not help your pain.  You have abnormal vaginal  discharge. SEEK IMMEDIATE MEDICAL CARE IF:   You have heavy bleeding from the vagina.   Your pelvic pain increases.   You feel light-headed or faint.   You have chills.   You have pain with urination or blood in your urine.   You have uncontrolled diarrhea or vomiting.   You have a fever or persistent symptoms for more than 3 days.  You have a fever and your symptoms suddenly get worse.   You are being physically or sexually abused.  MAKE SURE YOU:  Understand these instructions.  Will watch your condition.  Will get help if you are not doing well or get worse. Document Released: 06/09/2004 Document Revised: 11/27/2013 Document Reviewed: 11/02/2011 [facility] Patient Information 2015 [place], [place]. This information is not intended to replace advice given to you by your health care provider. Make sure you discuss any questions you have with your health care provider.

## 2014-02-16 NOTE — ED Provider Notes (Signed)
CSN: [id number]     Arrival date & time 02/15/14  2023 History   First MD Initiated Contact with Patient 02/15/14 2348     No chief complaint on file.    (Consider location/radiation/quality/duration/timing/severity/associated sxs/prior Treatment) HPI 28 year old female presents to emergency department with complaint of pelvic pain after recent miscarriage.  Patient reports that she had been on Depo until April when she switched to oral contraceptives.  Patient estimates that she had a normal period about 4 weeks ago.  She had a positive pregnancy test last week.  2 days after her positive pregnancy test, however she developed vaginal bleeding and cramping.  Patient reports that she had a large amount of bleeding over the weekend which has since trailed off.  Patient reports starting today, she has had worsening lower abdominal pain, back pain vaginal throbbing pain with brown discharge.  She denies any fever or chills.  Patient has followup with her GYN tomorrow, but reports the pain was so severe she came to the emergency department.  Patient has not taken any Tylenol, Advil, or other over-the-counter medications prior to arrival.  No urinary symptoms Past Medical History  Diagnosis Date  . SAB (spontaneous abortion) 12/2010    TWO  . Abnormal Pap smear 2003  . Hernia 09-2011  . No pertinent past medical history   . Migraine    Past Surgical History  Procedure Laterality Date  . Tonsillectomy    . Wisdom tooth extraction  2004     x4  . Cesarean section    . Cesarean section  12/10/2011    Procedure: CESAREAN SECTION;  Surgeon: Mora Bellman, MD;  Location: [facility];  Service: Gynecology;  Laterality: N/A;   Family History  Problem Relation Age of Onset  . Diabetes Mother   . Lung cancer Paternal Aunt   . Breast cancer Paternal Grandmother 64  . Prostate cancer Paternal Grandfather    History  Substance Use Topics  . Smoking status: Never Smoker   . Smokeless tobacco: Never Used   . Alcohol Use: No   OB History   Grav Para Term Preterm Abortions TAB SAB Ect Mult Living   4 2 2  2  1   2      Review of Systems  See History of Present Illness; otherwise all other systems are reviewed and negative   Allergies  Penicillins and Shellfish allergy  Home Medications   Prior to Admission medications   Not on File   BP 110/65  Pulse 75  Temp(Src) 98.4 F (36.9 C) (Oral)  Resp 17  SpO2 99% Physical Exam  Nursing note and vitals reviewed. Constitutional: She is oriented to person, place, and time. She appears well-developed and well-nourished.  HENT:  Head: Normocephalic and atraumatic.  Nose: Nose normal.  Mouth/Throat: Oropharynx is clear and moist.  Eyes: Conjunctivae and EOM are normal. Pupils are equal, round, and reactive to light.  Neck: Normal range of motion. Neck supple. No JVD present. No tracheal deviation present. No thyromegaly present.  Cardiovascular: Normal rate, regular rhythm, normal heart sounds and intact distal pulses.  Exam reveals no gallop and no friction rub.   No murmur heard. Pulmonary/Chest: Effort normal and breath sounds normal. No stridor. No respiratory distress. She has no wheezes. She has no rales. She exhibits no tenderness.  Abdominal: Soft. Bowel sounds are normal. She exhibits no distension and no mass. There is tenderness (suprapubic tenderness with palpation). There is no rebound and no guarding.  Genitourinary:  External genitalia normal Vagina with brown discharge Cervix closed no lesions, small amount of bright red blood at the os  No cervical motion tenderness Adnexa palpated, no masses or tenderness noted left patient has tenderness on the right Bladder palpated no tenderness Uterus palpated uterus is retroflexed.  She is diffusely tender  Musculoskeletal: Normal range of motion. She exhibits no edema and no tenderness.  Lymphadenopathy:    She has no cervical adenopathy.  Neurological: She is alert and  oriented to person, place, and time. She exhibits normal muscle tone. Coordination normal.  Skin: Skin is warm and dry. No rash noted. No erythema. No pallor.  Psychiatric: She has a normal mood and affect. Her behavior is normal. Judgment and thought content normal.    ED Course  Procedures (including critical care time) Labs Review Labs Reviewed  WET PREP, GENITAL - Abnormal; Notable for the following:    Clue Cells Wet Prep HPF POC MANY (*)    All other components within normal limits  CBC WITH DIFFERENTIAL - Abnormal; Notable for the following:    Neutrophils Relative % 38 (*)    Lymphocytes Relative 51 (*)    All other components within normal limits  COMPREHENSIVE METABOLIC PANEL - Abnormal; Notable for the following:    Glucose, Bld 115 (*)    All other components within normal limits  URINALYSIS, ROUTINE W REFLEX MICROSCOPIC - Abnormal; Notable for the following:    Specific Gravity, Urine 1.039 (*)    Ketones, ur 15 (*)    All other components within normal limits  GC/CHLAMYDIA PROBE AMP  HCG, QUANTITATIVE, PREGNANCY    Imaging Review US Transvaginal Non-ob  02/16/2014   CLINICAL DATA:  Pelvic pain, recent miscarriage.  EXAM: TRANSABDOMINAL AND TRANSVAGINAL ULTRASOUND OF PELVIS  DOPPLER ULTRASOUND OF OVARIES  TECHNIQUE: Both transabdominal and transvaginal ultrasound examinations of the pelvis were performed. Transabdominal technique was performed for global imaging of the pelvis including uterus, ovaries, adnexal regions, and pelvic cul-de-sac.  It was necessary to proceed with endovaginal exam following the transabdominal exam to visualize the endometrium. Color and duplex Doppler ultrasound was utilized to evaluate blood flow to the ovaries.  COMPARISON:  None.  FINDINGS: Uterus  Measurements: 8.2 x 3.5 x 4.8 cm. 1.5 x 1.3 x 1.4 cm intramural leiomyoma within the anterior lower uterine segment  Endometrium  Thickness: 5 mm. Trilaminar appearance, without abnormal vascularity  nor mass.  Right ovary  Measurements: 4 x 2.7 x 2.8 cm. 2 cm benign-appearing anechoic cyst/dominant follicle. Normal appearance/no adnexal mass.  Left ovary  Measurements: 3.3 x 2.6 x 2.1 cm. Normal appearance/no adnexal mass.  Pulsed Doppler evaluation of both ovaries demonstrates normal low-resistance arterial and venous waveforms.  Other findings  No free fluid.  IMPRESSION: No sonographic findings of retained products of conception.  1.5 cm lower uterine segment intramural leiomyoma with otherwise normal pelvic ultrasound.   Electronically Signed   By: Elon Alas   On: 02/16/2014 01:50   US Pelvis Complete  02/16/2014   CLINICAL DATA:  Pelvic pain, recent miscarriage.  EXAM: TRANSABDOMINAL AND TRANSVAGINAL ULTRASOUND OF PELVIS  DOPPLER ULTRASOUND OF OVARIES  TECHNIQUE: Both transabdominal and transvaginal ultrasound examinations of the pelvis were performed. Transabdominal technique was performed for global imaging of the pelvis including uterus, ovaries, adnexal regions, and pelvic cul-de-sac.  It was necessary to proceed with endovaginal exam following the transabdominal exam to visualize the endometrium. Color and duplex Doppler ultrasound was utilized to evaluate blood flow to the ovaries.  COMPARISON:  None.  FINDINGS: Uterus  Measurements: 8.2 x 3.5 x 4.8 cm. 1.5 x 1.3 x 1.4 cm intramural leiomyoma within the anterior lower uterine segment  Endometrium  Thickness: 5 mm. Trilaminar appearance, without abnormal vascularity nor mass.  Right ovary  Measurements: 4 x 2.7 x 2.8 cm. 2 cm benign-appearing anechoic cyst/dominant follicle. Normal appearance/no adnexal mass.  Left ovary  Measurements: 3.3 x 2.6 x 2.1 cm. Normal appearance/no adnexal mass.  Pulsed Doppler evaluation of both ovaries demonstrates normal low-resistance arterial and venous waveforms.  Other findings  No free fluid.  IMPRESSION: No sonographic findings of retained products of conception.  1.5 cm lower uterine segment intramural  leiomyoma with otherwise normal pelvic ultrasound.   Electronically Signed   By: Elon Alas   On: 02/16/2014 01:50   [place] Art/ven Flow Abd Pelv Doppler  02/16/2014   CLINICAL DATA:  Pelvic pain, recent miscarriage.  EXAM: TRANSABDOMINAL AND TRANSVAGINAL ULTRASOUND OF PELVIS  DOPPLER ULTRASOUND OF OVARIES  TECHNIQUE: Both transabdominal and transvaginal ultrasound examinations of the pelvis were performed. Transabdominal technique was performed for global imaging of the pelvis including uterus, ovaries, adnexal regions, and pelvic cul-de-sac.  It was necessary to proceed with endovaginal exam following the transabdominal exam to visualize the endometrium. Color and duplex Doppler ultrasound was utilized to evaluate blood flow to the ovaries.  COMPARISON:  None.  FINDINGS: Uterus  Measurements: 8.2 x 3.5 x 4.8 cm. 1.5 x 1.3 x 1.4 cm intramural leiomyoma within the anterior lower uterine segment  Endometrium  Thickness: 5 mm. Trilaminar appearance, without abnormal vascularity nor mass.  Right ovary  Measurements: 4 x 2.7 x 2.8 cm. 2 cm benign-appearing anechoic cyst/dominant follicle. Normal appearance/no adnexal mass.  Left ovary  Measurements: 3.3 x 2.6 x 2.1 cm. Normal appearance/no adnexal mass.  Pulsed Doppler evaluation of both ovaries demonstrates normal low-resistance arterial and venous waveforms.  Other findings  No free fluid.  IMPRESSION: No sonographic findings of retained products of conception.  1.5 cm lower uterine segment intramural leiomyoma with otherwise normal pelvic ultrasound.   Electronically Signed   By: Elon Alas   On: 02/16/2014 01:50     EKG Interpretation None      MDM   Final diagnoses:  Pelvic pain in female  Complete miscarriage   28 year old female with pelvic pain after recent miscarriage.  Patient reports no improvement with Naprosyn.  Given persistent pain and concern for possible retained products of conception, plan for transvaginal and pelvic  ultrasound for further evaluation.  Kalman Drape, MD 02/16/14 [phone]

## 2014-02-17 LAB — GC/CHLAMYDIA PROBE AMP
CT PROBE, AMP APTIMA: NEGATIVE
GC PROBE AMP APTIMA: NEGATIVE

## 2014-05-09 ENCOUNTER — Telehealth: Payer: Self-pay | Admitting: *Deleted

## 2014-05-09 DIAGNOSIS — O219 Vomiting of pregnancy, unspecified: Secondary | ICD-10-CM

## 2014-05-09 MED ORDER — PROMETHAZINE HCL 25 MG PO TABS: 25.0000 mg | ORAL_TABLET | Freq: Four times a day (QID) | ORAL | Status: DC | PRN

## 2014-05-09 NOTE — Telephone Encounter (Signed)
Patient has positive pregnancy test and would like something called in for nausea and vomiting.

## 2014-05-09 NOTE — Addendum Note (Signed)
Addended by: Tarry Kos on: 05/09/2014 03:20 PM   Modules accepted: Orders

## 2014-05-11 ENCOUNTER — Emergency Department (HOSPITAL_COMMUNITY): Payer: 59

## 2014-05-11 ENCOUNTER — Emergency Department (HOSPITAL_COMMUNITY)
Admission: EM | Admit: 2014-05-11 | Discharge: 2014-05-11 | Disposition: A | Discharge status: Home / Self Care | Payer: 59 | Attending: Emergency Medicine | Admitting: Emergency Medicine

## 2014-05-11 ENCOUNTER — Encounter (HOSPITAL_COMMUNITY): Payer: Self-pay | Admitting: Emergency Medicine

## 2014-05-11 DIAGNOSIS — R079 Chest pain, unspecified: Secondary | ICD-10-CM | POA: Diagnosis present

## 2014-05-11 DIAGNOSIS — Z331 Pregnant state, incidental: Secondary | ICD-10-CM | POA: Insufficient documentation

## 2014-05-11 DIAGNOSIS — Z880 Allergy status to penicillin: Secondary | ICD-10-CM | POA: Diagnosis not present

## 2014-05-11 DIAGNOSIS — Z3201 Encounter for pregnancy test, result positive: Secondary | ICD-10-CM | POA: Diagnosis not present

## 2014-05-11 DIAGNOSIS — Z8679 Personal history of other diseases of the circulatory system: Secondary | ICD-10-CM | POA: Insufficient documentation

## 2014-05-11 DIAGNOSIS — R0602 Shortness of breath: Secondary | ICD-10-CM | POA: Insufficient documentation

## 2014-05-11 DIAGNOSIS — R0789 Other chest pain: Secondary | ICD-10-CM | POA: Diagnosis not present

## 2014-05-11 LAB — URINALYSIS, ROUTINE W REFLEX MICROSCOPIC
Bilirubin Urine: NEGATIVE
Glucose, UA: NEGATIVE mg/dL
Hgb urine dipstick: NEGATIVE
Ketones, ur: 15 mg/dL — AB
Leukocytes, UA: NEGATIVE
NITRITE: NEGATIVE
PROTEIN: NEGATIVE mg/dL
Specific Gravity, Urine: 1.024 (ref 1.005–1.030)
UROBILINOGEN UA: 1 mg/dL (ref 0.0–1.0)
pH: 6 (ref 5.0–8.0)

## 2014-05-11 LAB — I-STAT CHEM 8, ED
BUN: 6 mg/dL (ref 6–23)
CALCIUM ION: 1.1 mmol/L — AB (ref 1.12–1.23)
Chloride: 102 mEq/L (ref 96–112)
Creatinine, Ser: 0.8 mg/dL (ref 0.50–1.10)
GLUCOSE: 69 mg/dL — AB (ref 70–99)
HCT: 47 % — ABNORMAL HIGH (ref 36.0–46.0)
HEMOGLOBIN: 16 g/dL — AB (ref 12.0–15.0)
Potassium: 3.6 mEq/L — ABNORMAL LOW (ref 3.7–5.3)
Sodium: 137 mEq/L (ref 137–147)
TCO2: 23 mmol/L (ref 0–100)

## 2014-05-11 LAB — I-STAT TROPONIN, ED
Troponin i, poc: 0 ng/mL (ref 0.00–0.08)
Troponin i, poc: 0.01 ng/mL (ref 0.00–0.08)

## 2014-05-11 LAB — PREGNANCY, URINE: Preg Test, Ur: POSITIVE — AB

## 2014-05-11 LAB — D-DIMER, QUANTITATIVE (NOT AT ARMC): D DIMER QUANT: 0.31 ug{FEU}/mL (ref 0.00–0.48)

## 2014-05-11 MED ORDER — ACETAMINOPHEN 325 MG PO TABS
650.0000 mg | ORAL_TABLET | Freq: Once | ORAL | Status: AC
  Administered 2014-05-11: 650 mg via ORAL
  Filled 2014-05-11: qty 2

## 2014-05-11 NOTE — ED Notes (Signed)
6 week ob  ( G 6 P 2  A 3 L2 )pt that started to have cp this and diff breathing this am  No radiation, has not felt baby move sees her dr first time next week

## 2014-05-11 NOTE — ED Notes (Signed)
Drew Blood from Patient. Patient given warm blanket. Comfortable.

## 2014-05-11 NOTE — ED Provider Notes (Signed)
CSN: [id number]     Arrival date & time 05/11/14  1024 History   First MD Initiated Contact with Patient 05/11/14 1048     Chief Complaint  Patient presents with  . Chest Pain  . Shortness of Breath      HPI Pt was seen at 1055. Per pt, c/o sudden onset and persistence of constant left upper chest wall "pain" that began approximately 10am PTA. Pt describes the CP as "sharp," worsens with palpation of the area and body position changes. Pt states the pain "takes my breath away." Pt has hx of same approximately 1 year ago, dx chest wall pain. Pt denies any other complaints. Pt states she "just found out" she was pregnant, hx G5P2AB2, with LMP 04/01/2014, est EGA [redacted] weeks and 5/7 days. Denies palpitations, no cough, no abd pain, no N/V/D, no back pain, no dysuria/hematuria, no vaginal bleeding, no pelvic pain.    Past Medical History  Diagnosis Date  . SAB (spontaneous abortion) 12/2010    TWO  . Abnormal Pap smear 2003  . Hernia 09-2011  . No pertinent past medical history   . Migraine    Past Surgical History  Procedure Laterality Date  . Tonsillectomy    . Wisdom tooth extraction  2004     x4  . Cesarean section    . Cesarean section  12/10/2011    Procedure: CESAREAN SECTION;  Surgeon: Mora Bellman, MD;  Location: [facility];  Service: Gynecology;  Laterality: N/A;   Family History  Problem Relation Age of Onset  . Diabetes Mother   . Lung cancer Paternal Aunt   . Breast cancer Paternal Grandmother 73  . Prostate cancer Paternal Grandfather    History  Substance Use Topics  . Smoking status: Never Smoker   . Smokeless tobacco: Never Used  . Alcohol Use: No   OB History   Grav Para Term Preterm Abortions TAB SAB Ect Mult Living   5 2 2  2  1   2      Review of Systems ROS: Statement: All systems negative except as marked or noted in the HPI; Constitutional: Negative for fever and chills. ; ; Eyes: Negative for eye pain, redness and discharge. ; ; ENMT: Negative for ear  pain, hoarseness, nasal congestion, sinus pressure and sore throat. ; ; Cardiovascular: Negative for palpitations, diaphoresis, dyspnea and peripheral edema. ; ; Respiratory: Negative for cough, wheezing and stridor. ; ; Gastrointestinal: Negative for nausea, vomiting, diarrhea, abdominal pain, blood in stool, hematemesis, jaundice and rectal bleeding. . ; ; Genitourinary: Negative for dysuria, flank pain and hematuria. ; ; Musculoskeletal: +chest wall pain. Negative for back pain and neck pain. Negative for swelling and trauma.; ; Skin: Negative for pruritus, rash, abrasions, blisters, bruising and skin lesion.; ; Neuro: Negative for headache, lightheadedness and neck stiffness. Negative for weakness, altered level of consciousness , altered mental status, extremity weakness, paresthesias, involuntary movement, seizure and syncope.      Allergies  Penicillins and Shellfish allergy  Home Medications   Prior to Admission medications   Medication Sig Start Date End Date Taking? Authorizing Provider  Prenatal Vit-Fe Fumarate-FA (PRENATAL MULTIVITAMIN) TABS tablet Take 1 tablet by mouth daily at 12 noon.   Yes Historical Provider, MD  promethazine (PHENERGAN) 25 MG tablet Take 1 tablet (25 mg total) by mouth every 6 (six) hours as needed for nausea or vomiting. 05/09/14   Seabron Spates, CNM   BP 114/73  Pulse 90  Resp 17  SpO2 100% Physical Exam 1100: Physical examination:  Nursing notes reviewed; Vital signs and O2 SAT reviewed;  Constitutional: Well developed, Well nourished, Well hydrated, In no acute distress; Head:  Normocephalic, atraumatic; Eyes: EOMI, PERRL, No scleral icterus; ENMT: Mouth and pharynx normal, Mucous membranes moist; Neck: Supple, Full range of motion, No lymphadenopathy; Cardiovascular: Regular rate and rhythm, No murmur, rub, or gallop; Respiratory: Breath sounds clear & equal bilaterally, No rales, rhonchi, wheezes.  Speaking full sentences with ease, Normal respiratory  effort/excursion; Chest: +let upper parasternal and anterior chest wall point tender to palp. No rash, no ecchymosis, no erythema. Movement normal; Abdomen: Soft, Nontender, Nondistended, Normal bowel sounds; Genitourinary: No CVA tenderness; Extremities: Pulses normal, No tenderness, No edema, No calf edema or asymmetry.; Neuro: AA&Ox3, Major CN grossly intact.  Speech clear. No gross focal motor or sensory deficits in extremities.; Skin: Color normal, Warm, Dry.   ED Course  Procedures     EKG Interpretation   Date/Time:  Friday May 11 2014 10:32:08 EDT Ventricular Rate:  103 PR Interval:  132 QRS Duration: 82 QT Interval:  364 QTC Calculation: 476 R Axis:   83 Text Interpretation:  Sinus tachycardia Nonspecific T wave abnormality  Anterior leads When compared with ECG of 05/05/2013 Nonspecific T wave  abnormality is now Present Confirmed by [facility]  MD, Kadience Macchi [phone]) on  05/11/2014 11:03:54 AM      EKG Interpretation  Date/Time:  Friday May 11 2014 12:38:59 EDT Ventricular Rate:  82 PR Interval:  142 QRS Duration: 87 QT Interval:  389 QTC Calculation: 454 R Axis:   72 Text Interpretation:  Sinus rhythm RSR' in V1 or V2, probably normal variant ST elev, probable normal early repol pattern When compared with ECG of 05/05/2013 No significant change was found Confirmed by [facility]  MD, Nunzio Cory [phone]) on 05/11/2014 12:46:11 PM        MDM  MDM Reviewed: previous chart, nursing note and vitals Reviewed previous: labs and ECG Interpretation: labs, ECG and x-ray    Results for orders placed during the hospital encounter of 05/11/14  URINALYSIS, ROUTINE W REFLEX MICROSCOPIC      Result Value Ref Range   Color, Urine YELLOW  YELLOW   APPearance CLEAR  CLEAR   Specific Gravity, Urine 1.024  1.005 - 1.030   pH 6.0  5.0 - 8.0   Glucose, UA NEGATIVE  NEGATIVE mg/dL   Hgb urine dipstick NEGATIVE  NEGATIVE   Bilirubin Urine NEGATIVE  NEGATIVE   Ketones, ur 15  (*) NEGATIVE mg/dL   Protein, ur NEGATIVE  NEGATIVE mg/dL   Urobilinogen, UA 1.0  0.0 - 1.0 mg/dL   Nitrite NEGATIVE  NEGATIVE   Leukocytes, UA NEGATIVE  NEGATIVE  PREGNANCY, URINE      Result Value Ref Range   Preg Test, Ur POSITIVE (*) NEGATIVE  D-DIMER, QUANTITATIVE      Result Value Ref Range   D-Dimer, Quant 0.31  0.00 - 0.48 ug/mL-FEU  I-STAT CHEM 8, ED      Result Value Ref Range   Sodium 137  137 - 147 mEq/L   Potassium 3.6 (*) 3.7 - 5.3 mEq/L   Chloride 102  96 - 112 mEq/L   BUN 6  6 - 23 mg/dL   Creatinine, Ser 0.80  0.50 - 1.10 mg/dL   Glucose, Bld 69 (*) 70 - 99 mg/dL   Calcium, Ion 1.10 (*) 1.12 - 1.23 mmol/L   TCO2 23  0 - 100 mmol/L   Hemoglobin 16.0 (*)  12.0 - 15.0 g/dL   HCT 47.0 (*) 36.0 - 46.0 %  I-STAT TROPOININ, ED      Result Value Ref Range   Troponin i, poc 0.00  0.00 - 0.08 ng/mL   Comment 3           I-STAT TROPOININ, ED      Result Value Ref Range   Troponin i, poc 0.01  0.00 - 0.08 ng/mL   Comment 3            Dg Chest 2 View 05/11/2014   CLINICAL DATA:  Chest pain.  Shortness of breath.  EXAM: CHEST  2 VIEW  COMPARISON:  05/05/2013 .  FINDINGS: Mediastinum and hilar structures are normal. The lungs are clear. Heart size normal. No pleural effusion or pneumothorax. Scoliosis thoracic spine.  IMPRESSION: No active cardiopulmonary disease.   Electronically Signed   By: Marcello Moores  Register   On: 05/11/2014 11:45    1230:  1st EKG with sinus tachycardia and NS TW changes anterior leads. D-dimer and troponin negative. Tylenol given for pain. EKG repeated: now NSR and unchanged from previous EKG dated 04/2013. Will repeat troponin at 1600 (6 hours after onset of symptoms). Pt denies any issues with her pregnancy at this time.   1430:  2nd troponin negative. VS remain stable. Pt states she wants to go home now. Doubt PE as cause for symptoms with normal d-dimer and low risk [place].  Doubt ACS as cause for symptoms with normal troponin and unchanged EKG from  previous after 6 hours of constant symptoms. Will tx symptomatically at this time. Dx and testing d/w pt.  Questions answered.  Verb understanding, agreeable to d/c home with outpt f/u.   Francine Graven, DO 05/14/14 1721

## 2014-05-11 NOTE — Discharge Instructions (Signed)
°Emergency Department Resource Guide °1) Find a Doctor and Pay Out of Pocket °Although you won't have to find out who is covered by your insurance plan, it is a good idea to ask around and get recommendations. You will then need to call the office and see if the doctor you have chosen will accept you as a new patient and what types of options they offer for patients who are self-pay. Some doctors offer discounts or will set up payment plans for their patients who do not have insurance, but you will need to ask so you aren't surprised when you get to your appointment. ° °2) Contact Your Local Health Department °Not all health departments have doctors that can see patients for sick visits, but many do, so it is worth a call to see if yours does. If you don't know where your local health department is, you can check in your phone book. The CDC also has a tool to help you locate your state's health department, and many state websites also have listings of all of their local health departments. ° °3) Find a Walk-in Clinic °If your illness is not likely to be very severe or complicated, you may want to try a walk in clinic. These are popping up all over the country in pharmacies, drugstores, and shopping centers. They're usually staffed by nurse practitioners or physician assistants that have been trained to treat common illnesses and complaints. They're usually fairly quick and inexpensive. However, if you have serious medical issues or chronic medical problems, these are probably not your best option. ° °No Primary Care Doctor: °- Call Health Connect at  [phone] - they can help you locate a primary care doctor that  accepts your insurance, provides certain services, etc. °- Physician Referral Service- 1-800-533-3463 ° °Chronic Pain Problems: °Organization         Address  Phone   Notes  °[facility]  ([phone]) [phone] Patients need to be referred by their primary care doctor.  ° °Medication  Assistance: °Organization         Address  Phone   Notes  °[place] [facility] [address] °[place], [place] [address] (336) 641-8030 --Must be a resident of [place] °-- Must have NO insurance coverage whatsoever (no Medicaid/ Medicare, etc.) °-- The pt. MUST have a primary care doctor that directs their care regularly and follows them in the community °  °[facility]  (866) 331-1348   °[employer]  (888) 892-1162   ° °Agencies that provide inexpensive medical care: °Organization         Address  Phone   Notes  °[facility]  (336) 832-8035   °[facility]    (336) 832-7272   °[facility] [address] °[place], [place] [address] (336) 832-4777   °[facility] [address], °[place] (336) 271-4999   °[facility]    (336) 373-0678   °[facility]    (336) 272-1050   °[facility] ° [address], [place] Phone:  (336) 832-4444, Fax:  (336) 832-4440 Hours of Operation:  9 am - 6 pm, M-F.  Also accepts Medicaid/Medicare and self-pay.  °[facility] ° [address], [place] Phone: (336) [phone], Fax: (336) [phone]. Hours of Operation:  8:30 am - 5:30 pm, M-F.  Also accepts Medicaid and self-pay.  °[facility] [place] [address], [place] Phone: (336) 878-6027   °[facility] [address], [place], [place] (336)723-1848, Ext. 123 Mondays & Thursdays: 7-9 AM.  First 15 patients are seen on a first come, first serve basis. °  ° °Medicaid-accepting [place] Providers: ° °Organization         Address  Phone   Notes  °[facility] [address], [place] (336) 641-2100 Also accepts self-pay patients.  °[facility] [address], [place] ° (336) 856-9996   °[facility] [address], [place]  (336) 288-8857   °[facility] [address] [place] [address], [place] (336) 299-7000   °Veita Bland [address], [place]  ° (336) 373-1557 Only accepts [place] Access Medicaid patients after they have their name applied to their card.  ° °Self-Pay (no insurance) in [place]: ° °Organization         Address  Phone   Notes  °Sickle Cell Patients, [facility] [address], [place] (336) 832-1970   °[facility] [address], [place] (336) 832-4400   °[facility] ° [address], [place] (336) 992-4800   °[facility]/Dr. Osei-Bonsu ° [address] [place] [address], [place] or [address], [place] (336) [phone] Phone number for both [place] and [place] locations is the same.  °[facility] [address], [place] (336) 299-0000   °[facility] [address] [place] [address], [place] or [address] (336) 852-7530 °(336) 878-2260   °[facility] [address], [place] (336) 350-1642, phone; (336) 294-5005, fax Sees patients 1st and 3rd Saturday of every month.  Must not qualify for public or private insurance (i.e. Medicaid, Medicare, [place] Health Choice, Veterans' Benefits) • Household income should be no more than 200% of the poverty level •The clinic cannot treat you if you are pregnant or think you are pregnant • Sexually transmitted diseases are not treated at the clinic.  ° ° °Dental Care: °Organization         Address  Phone  Notes  °[place] [facility] [address], [place] (336) 641-6152 Accepts children up to age 21 who are enrolled in Medicaid or [place] Health Choice; pregnant women with a Medicaid card; and children who have applied for Medicaid or [place] Health Choice, but were declined, whose parents can pay a reduced fee at time of service.  °[place]  [facility] [place]  [address], [place] (336) 641-7733 Accepts children up to age 21 who are enrolled in Medicaid or [place] Health Choice; pregnant women with a Medicaid card; and children who have applied for Medicaid or [place] Health Choice, but were declined, whose parents can pay a reduced fee at time of service.  °[facility] ° [address], [place] (336) 641-4533 Patients are seen by appointment only. Walk-ins are not accepted. [facility] will see patients 18 years of age and older. °Monday - Tuesday (8am-5pm) °Most Wednesdays (8:30-5pm) °$30 per visit, cash only  °[facility] ° [address], [place] (336) [phone] Patients are seen by appointment only. Walk-ins are not accepted. [facility] will see patients 18 years of age and older. °One   Wednesday Evening (Monthly: Volunteer Based).  $30 per visit, cash only  °[facility]  (919) [phone] for adults; Children under age 4, call Graduate Pediatric Dentistry at ([phone]) [phone]. Children aged 4-14, please call ([phone]) [phone] to request a pediatric application. ° Dental services are provided in all areas of dental care including fillings, crowns and bridges, complete and partial dentures, implants, gum treatment, root canals, and extractions. Preventive care is also provided. Treatment is provided to both adults and children. °Patients are selected via a lottery and there is often a waiting list. °  °[facility] [address], °[place] ° (336) 763-8833 [email or web address] °  °[facility] [address], [place], [place] (336)723-1848, Ext. 123 Second and Fourth Thursday of each month, opens at 6:30 AM; Clinic ends at 9 AM.  Patients are seen on a first-come first-served basis, and a limited number are seen during each clinic.  ° °[facility] ° [address], [place], [place] (336) [phone]    Eligibility Requirements °You must have lived in [place], [place], or [place] counties for at least the last three months. °  You cannot be eligible for state or federal sponsored healthcare insurance, including Veterans Administration, Medicaid, or Medicare. °  You generally cannot be eligible for healthcare insurance through your employer.  °  How to apply: °Eligibility screenings are held every Tuesday and Wednesday afternoon from 1:00 pm until 4:00 pm. You do not need an appointment for the interview!  °[facility] [address], [place], [place] 336-631-2330   °[facility]  336-342-8273   °[facility]  336-703-3100   °[facility]  336-570-6415   ° °Behavioral Health Resources in the Community: °Intensive Outpatient Programs °Organization         Address  Phone  Notes  °[facility] [address], [place], [place] 336-878-6098   °[facility] [address], [place], [place] 336-832-9800   °[facility] [address], [place], [place] ° 336-882-2125   °[facility] [address],  °[place], [place] 1-800-853-5163 or 336-641-4981   °Substance Abuse Resources °Organization         Address  Phone  Notes  °[facility]  336-882-2125   °[facility]  336-784-9470   °[facility]  336-285-9073   °[facility]  336-845-3988   °Residential & Outpatient Substance Abuse Program  1-800-659-3381   °Psychological Services °Organization         Address  Phone  Notes  °[facility]  336- 832-9600   °[facility]  336- 378-7881   °[facility] [address], [place] 1-800-853-5163 or 336-641-4981   ° °Mobile Crisis Teams °Organization         Address  Phone  Notes  °[facility], Mobile Crisis Care Unit  1-877-626-1772   °Assertive °[facility] ° [address]  [place], [place] 336-834-9664   °Sharon DeEsch [address] °[place] 336-554-5454   ° °Self-Help/Support Groups °Organization         Address  Phone             Notes  °[facility] - variety of support groups  336- 373-1402 Call for more information  °Narcotics Anonymous (NA), [facility] [address], °[place]  2 meetings at this location  ° °  Residential Treatment Programs Organization         Address  Phone  Notes  [facility] [address]    [place]  1-445-044-0286   [facility]  [address] [place] [address], [place], [place]   [place], [place] (615) [phone] Admissions: 8am-3pm M-F  [facility] [address],    [place], [place] 025-852-7782   [facility] [address] [place], [place], [place]   [facility] [address]  [place], [place]   [facility] - Intensive Outpatient [address], [place], [place]   [facility] ([place]  [place], [place] 1-509-377-9755 or [phone]   [facility] [address], [place], [place] Accepts Medicaid  [facility] [address],  [place] 1-8126843648 Substance Abuse/Addiction Treatment   [facility]         Address  Phone  Notes  [facility]  717-801-3720   Domenic Schwab, PhD [address] [place], [place]   404-030-7969 or [phone]   [facility] [place], [place] (727) 853-4769   [facility] [address], [place], [place] 323-644-2711 Insurance/Medicaid/sponsorship through [facility] and Families [address]                                    [place], [place] [phone] [place] [address], [place] [phone]    Dr. Adele Schilder  ([phone]   [facility] Dept. 1) [address], [place] 2) [place] 3)  [place] [address], [place] [phone]) [phone]  [phone]   [place] ([phone] or [phone] (After Hours)       Take over the counter tylenol, as directed on packaging, as needed for discomfort.  Apply moist heat or ice to the area(s) of discomfort, for 15 minutes at a time, several times per day for the next few days.  Do not fall asleep on a heating or ice pack.  Call your regular medical doctor today to schedule a follow up appointment in the next 3 days.  Return to the Emergency Department immediately if worsening.

## 2014-05-11 NOTE — ED Notes (Signed)
Patient returned from X-ray 

## 2014-05-11 NOTE — ED Notes (Signed)
Phlebotomy at the bedside  

## 2014-05-14 ENCOUNTER — Encounter: Payer: Self-pay | Admitting: Family Medicine

## 2014-05-14 ENCOUNTER — Ambulatory Visit (INDEPENDENT_AMBULATORY_CARE_PROVIDER_SITE_OTHER): Payer: 59 | Admitting: Family Medicine

## 2014-05-14 DIAGNOSIS — Z3491 Encounter for supervision of normal pregnancy, unspecified, first trimester: Secondary | ICD-10-CM

## 2014-05-14 DIAGNOSIS — O3421 Maternal care for scar from previous cesarean delivery: Secondary | ICD-10-CM

## 2014-05-14 DIAGNOSIS — O34219 Maternal care for unspecified type scar from previous cesarean delivery: Secondary | ICD-10-CM | POA: Insufficient documentation

## 2014-05-14 LAB — OB RESULTS CONSOLE RUBELLA ANTIBODY, IGM: RUBELLA: IMMUNE

## 2014-05-14 NOTE — Progress Notes (Signed)
   Subjective:    Frances Cohen is a [id number] [redacted]w[redacted]d being seen today for her first obstetrical visit.  Her obstetrical history is significant for previous C-section x 2. Pregnancy history fully reviewed.  Patient reports no complaints.  There were no vitals filed for this visit.  HISTORY: OB History  Gravida Para Term Preterm AB SAB TAB Ectopic Multiple Living  6 2 2  3 2    2     # Outcome Date GA Lbr Len/2nd Weight Sex Delivery Anes PTL Lv  6 CUR           5 SAB 02/10/14 [redacted]w[redacted]d         4 TRM 12/10/11 [redacted]w[redacted]d  7 lb 14.8 oz (3.595 kg) F LTCS Spinal  Y  3 SAB 11/2010 [redacted]w[redacted]d         2 TRM 05/2009 [redacted]w[redacted]d  8 lb 3 oz (3.714 kg) M LTCS Spinal  Y  1 ABT 08/2006 [redacted]w[redacted]d       N     Past Medical History  Diagnosis Date  . SAB (spontaneous abortion) 12/2010    TWO  . Abnormal Pap smear 2003  . Hernia 09-2011  . No pertinent past medical history   . Migraine    Past Surgical History  Procedure Laterality Date  . Tonsillectomy    . Wisdom tooth extraction  2004     x4  . Cesarean section    . Cesarean section  12/10/2011    Procedure: CESAREAN SECTION;  Surgeon: Mora Bellman, MD;  Location: [facility];  Service: Gynecology;  Laterality: N/A;   Family History  Problem Relation Age of Onset  . Diabetes Mother   . Lung cancer Paternal Aunt   . Breast cancer Paternal Grandmother 60  . Prostate cancer Paternal Grandfather      Exam    Uterus:   6 wk ssize  Pelvic Exam:    Perineum: Normal Perineum   Vulva: Bartholin's, Urethra, Skene's normal   Vagina:  normal mucosa, normal discharge   Cervix: no lesions and nulliparous appearance   Adnexa: normal adnexa   Bony Pelvis: average   Skin: normal coloration and turgor, no rashes    Neurologic: normal   Extremities: normal strength, tone, and muscle mass   HEENT sclera clear, anicteric and oropharynx clear, no lesions   Mouth/Teeth mucous membranes moist, pharynx normal without lesions and dental hygiene good   Neck supple   Cardiovascular: regular rate and rhythm, no murmurs or gallops   Respiratory:  appears well, vitals normal, no respiratory distress, acyanotic, normal RR, ear and throat exam is normal, neck free of mass or lymphadenopathy, chest clear, no wheezing, crepitations, rhonchi, normal symmetric air entry   Abdomen: soft, non-tender; bowel sounds normal; no masses,  no organomegaly      Assessment:    Pregnancy: [id number] Patient Active Problem List   Diagnosis Date Noted  . Supervision of low-risk pregnancy 12/02/2011    Priority: High  . Previous cesarean delivery affecting pregnancy, antepartum 05/14/2014    Priority: Medium  . Femoral hernia 10/13/2011        Plan:     Initial labs drawn. Prenatal vitamins. Problem list reviewed and updated. Genetic Screening discussed First Screen: declined. Ultrasound discussed; fetal survey: At 20 wks. Follow up in 4 weeks. Declines flu shot  Sunaina Ferrando S 05/14/2014

## 2014-05-14 NOTE — Patient Instructions (Addendum)
First Trimester of Pregnancy The first trimester of pregnancy is from week 1 until the end of week 12 (months 1 through 3). A week after a sperm fertilizes an egg, the egg will implant on the wall of the uterus. This embryo will begin to develop into a baby. Genes from you and your partner are forming the baby. The female genes determine whether the baby is a boy or a girl. At 6-8 weeks, the eyes and face are formed, and the heartbeat can be seen on ultrasound. At the end of 12 weeks, all the baby's organs are formed.  Now that you are pregnant, you will want to do everything you can to have a healthy baby. Two of the most important things are to get good prenatal care and to follow your health care provider's instructions. Prenatal care is all the medical care you receive before the baby's birth. This care will help prevent, find, and treat any problems during the pregnancy and childbirth. BODY CHANGES Your body goes through many changes during pregnancy. The changes vary from woman to woman.   You may gain or lose a couple of pounds at first.  You may feel sick to your stomach (nauseous) and throw up (vomit). If the vomiting is uncontrollable, call your health care provider.  You may tire easily.  You may develop headaches that can be relieved by medicines approved by your health care provider.  You may urinate more often. Painful urination may mean you have a bladder infection.  You may develop heartburn as a result of your pregnancy.  You may develop constipation because certain hormones are causing the muscles that push waste through your intestines to slow down.  You may develop hemorrhoids or swollen, bulging veins (varicose veins).  Your breasts may begin to grow larger and become tender. Your nipples may stick out more, and the tissue that surrounds them (areola) may become darker.  Your gums may bleed and may be sensitive to brushing and flossing.  Dark spots or blotches  (chloasma, mask of pregnancy) may develop on your face. This will likely fade after the baby is born.  Your menstrual periods will stop.  You may have a loss of appetite.  You may develop cravings for certain kinds of food.  You may have changes in your emotions from day to day, such as being excited to be pregnant or being concerned that something may go wrong with the pregnancy and baby.  You may have more vivid and strange dreams.  You may have changes in your hair. These can include thickening of your hair, rapid growth, and changes in texture. Some women also have hair loss during or after pregnancy, or hair that feels dry or thin. Your hair will most likely return to normal after your baby is born. WHAT TO EXPECT AT YOUR PRENATAL VISITS During a routine prenatal visit:  You will be weighed to make sure you and the baby are growing normally.  Your blood pressure will be taken.  Your abdomen will be measured to track your baby's growth.  The fetal heartbeat will be listened to starting around week 10 or 12 of your pregnancy.  Test results from any previous visits will be discussed. Your health care provider may ask you:  How you are feeling.  If you are feeling the baby move.  If you have had any abnormal symptoms, such as leaking fluid, bleeding, severe headaches, or abdominal cramping.  If you have any questions. Other tests  that may be performed during your first trimester include:  Blood tests to find your blood type and to check for the presence of any previous infections. They will also be used to check for low iron levels (anemia) and Rh antibodies. Later in the pregnancy, blood tests for diabetes will be done along with other tests if problems develop.  Urine tests to check for infections, diabetes, or protein in the urine.  An ultrasound to confirm the proper growth and development of the baby.  An amniocentesis to check for possible genetic problems.  Fetal  screens for spina bifida and Down syndrome.  You may need other tests to make sure you and the baby are doing well. HOME CARE INSTRUCTIONS  Medicines  Follow your health care provider's instructions regarding medicine use. Specific medicines may be either safe or unsafe to take during pregnancy.  Take your prenatal vitamins as directed.  If you develop constipation, try taking a stool softener if your health care provider approves. Diet  Eat regular, well-balanced meals. Choose a variety of foods, such as meat or vegetable-based protein, fish, milk and low-fat dairy products, vegetables, fruits, and whole grain breads and cereals. Your health care provider will help you determine the amount of weight gain that is right for you.  Avoid raw meat and uncooked cheese. These carry germs that can cause birth defects in the baby.  Eating four or five small meals rather than three large meals a day may help relieve nausea and vomiting. If you start to feel nauseous, eating a few soda crackers can be helpful. Drinking liquids between meals instead of during meals also seems to help nausea and vomiting.  If you develop constipation, eat more high-fiber foods, such as fresh vegetables or fruit and whole grains. Drink enough fluids to keep your urine clear or pale yellow. Activity and Exercise  Exercise only as directed by your health care provider. Exercising will help you:  Control your weight.  Stay in shape.  Be prepared for labor and delivery.  Experiencing pain or cramping in the lower abdomen or low back is a good sign that you should stop exercising. Check with your health care provider before continuing normal exercises.  Try to avoid standing for long periods of time. Move your legs often if you must stand in one place for a long time.  Avoid heavy lifting.  Wear low-heeled shoes, and practice good posture.  You may continue to have sex unless your health care provider directs you  otherwise. Relief of Pain or Discomfort  Wear a good support bra for breast tenderness.   Take warm sitz baths to soothe any pain or discomfort caused by hemorrhoids. Use hemorrhoid cream if your health care provider approves.   Rest with your legs elevated if you have leg cramps or low back pain.  If you develop varicose veins in your legs, wear support hose. Elevate your feet for 15 minutes, 3-4 times a day. Limit salt in your diet. Prenatal Care  Schedule your prenatal visits by the twelfth week of pregnancy. They are usually scheduled monthly at first, then more often in the last 2 months before delivery.  Write down your questions. Take them to your prenatal visits.  Keep all your prenatal visits as directed by your health care provider. Safety  Wear your seat belt at all times when driving.  Make a list of emergency phone numbers, including numbers for family, friends, the hospital, and police and fire departments. General Tips  Ask your health care provider for a referral to a local prenatal education class. Begin classes no later than at the beginning of month 6 of your pregnancy.  Ask for help if you have counseling or nutritional needs during pregnancy. Your health care provider can offer advice or refer you to specialists for help with various needs.  Do not use hot tubs, steam rooms, or saunas.  Do not douche or use tampons or scented sanitary pads.  Do not cross your legs for long periods of time.  Avoid cat litter boxes and soil used by cats. These carry germs that can cause birth defects in the baby and possibly loss of the fetus by miscarriage or stillbirth.  Avoid all smoking, herbs, alcohol, and medicines not prescribed by your health care provider. Chemicals in these affect the formation and growth of the baby.  Schedule a dentist appointment. At home, brush your teeth with a soft toothbrush and be gentle when you floss. SEEK MEDICAL CARE IF:   You have  dizziness.  You have mild pelvic cramps, pelvic pressure, or nagging pain in the abdominal area.  You have persistent nausea, vomiting, or diarrhea.  You have a bad smelling vaginal discharge.  You have pain with urination.  You notice increased swelling in your face, hands, legs, or ankles. SEEK IMMEDIATE MEDICAL CARE IF:   You have a fever.  You are leaking fluid from your vagina.  You have spotting or bleeding from your vagina.  You have severe abdominal cramping or pain.  You have rapid weight gain or loss.  You vomit blood or material that looks like coffee grounds.  You are exposed to [place] measles and have never had them.  You are exposed to fifth disease or chickenpox.  You develop a severe headache.  You have shortness of breath.  You have any kind of trauma, such as from a fall or a car accident. Document Released: 07/07/2001 Document Revised: 11/27/2013 Document Reviewed: 05/23/2013 [facility] Patient Information 2015 [place], [place]. This information is not intended to replace advice given to you by your health care provider. Make sure you discuss any questions you have with your health care provider.  Breastfeeding Deciding to breastfeed is one of the [place] choices you can make for you and your baby. A change in hormones during pregnancy causes your breast tissue to grow and increases the number and size of your milk ducts. These hormones also allow proteins, sugars, and fats from your blood supply to make breast milk in your milk-producing glands. Hormones prevent breast milk from being released before your baby is born as well as prompt milk flow after birth. Once breastfeeding has begun, thoughts of your baby, as well as his or her sucking or crying, can stimulate the release of milk from your milk-producing glands.  BENEFITS OF BREASTFEEDING For Your Baby  Your first milk (colostrum) helps your baby's digestive system function better.   There are antibodies  in your milk that help your baby fight off infections.   Your baby has a lower incidence of asthma, allergies, and sudden infant death syndrome.   The nutrients in breast milk are better for your baby than infant formulas and are designed uniquely for your baby's needs.   Breast milk improves your baby's brain development.   Your baby is less likely to develop other conditions, such as childhood obesity, asthma, or type 2 diabetes mellitus.  For You   Breastfeeding helps to create a very special bond between  you and your baby.   Breastfeeding is convenient. Breast milk is always available at the correct temperature and costs nothing.   Breastfeeding helps to burn calories and helps you lose the weight gained during pregnancy.   Breastfeeding makes your uterus contract to its prepregnancy size faster and slows bleeding (lochia) after you give birth.   Breastfeeding helps to lower your risk of developing type 2 diabetes mellitus, osteoporosis, and breast or ovarian cancer later in life. SIGNS THAT YOUR BABY IS HUNGRY Early Signs of Hunger  Increased alertness or activity.  Stretching.  Movement of the head from side to side.  Movement of the head and opening of the mouth when the corner of the mouth or cheek is stroked (rooting).  Increased sucking sounds, smacking lips, cooing, sighing, or squeaking.  Hand-to-mouth movements.  Increased sucking of fingers or hands. Late Signs of Hunger  Fussing.  Intermittent crying. Extreme Signs of Hunger Signs of extreme hunger will require calming and consoling before your baby will be able to breastfeed successfully. Do not wait for the following signs of extreme hunger to occur before you initiate breastfeeding:   Restlessness.  A loud, strong cry.   Screaming. BREASTFEEDING BASICS Breastfeeding Initiation  Find a comfortable place to sit or lie down, with your neck and back well supported.  Place a pillow or  rolled up blanket under your baby to bring him or her to the level of your breast (if you are seated). Nursing pillows are specially designed to help support your arms and your baby while you breastfeed.  Make sure that your baby's abdomen is facing your abdomen.   Gently massage your breast. With your fingertips, massage from your chest wall toward your nipple in a circular motion. This encourages milk flow. You may need to continue this action during the feeding if your milk flows slowly.  Support your breast with 4 fingers underneath and your thumb above your nipple. Make sure your fingers are well away from your nipple and your baby's mouth.   Stroke your baby's lips gently with your finger or nipple.   When your baby's mouth is open wide enough, quickly bring your baby to your breast, placing your entire nipple and as much of the colored area around your nipple (areola) as possible into your baby's mouth.   More areola should be visible above your baby's upper lip than below the lower lip.   Your baby's tongue should be between his or her lower gum and your breast.   Ensure that your baby's mouth is correctly positioned around your nipple (latched). Your baby's lips should create a seal on your breast and be turned out (everted).  It is common for your baby to suck about 2-3 minutes in order to start the flow of breast milk. Latching Teaching your baby how to latch on to your breast properly is very important. An improper latch can cause nipple pain and decreased milk supply for you and poor weight gain in your baby. Also, if your baby is not latched onto your nipple properly, he or she may swallow some air during feeding. This can make your baby fussy. Burping your baby when you switch breasts during the feeding can help to get rid of the air. However, teaching your baby to latch on properly is still the [place] way to prevent fussiness from swallowing air while breastfeeding. Signs  that your baby has successfully latched on to your nipple:    Silent tugging or silent  sucking, without causing you pain.   Swallowing heard between every 3-4 sucks.    Muscle movement above and in front of his or her ears while sucking.  Signs that your baby has not successfully latched on to nipple:   Sucking sounds or smacking sounds from your baby while breastfeeding.  Nipple pain. If you think your baby has not latched on correctly, slip your finger into the corner of your baby's mouth to break the suction and place it between your baby's gums. Attempt breastfeeding initiation again. Signs of Successful Breastfeeding Signs from your baby:   A gradual decrease in the number of sucks or complete cessation of sucking.   Falling asleep.   Relaxation of his or her body.   Retention of a small amount of milk in his or her mouth.   Letting go of your breast by himself or herself. Signs from you:  Breasts that have increased in firmness, weight, and size 1-3 hours after feeding.   Breasts that are softer immediately after breastfeeding.  Increased milk volume, as well as a change in milk consistency and color by the fifth day of breastfeeding.   Nipples that are not sore, cracked, or bleeding. Signs That Your Randel Books is Getting Enough Milk  Wetting at least 3 diapers in a 24-hour period. The urine should be clear and pale yellow by age 69 days.  At least 3 stools in a 24-hour period by age 69 days. The stool should be soft and yellow.  At least 3 stools in a 24-hour period by age 34 days. The stool should be seedy and yellow.  No loss of weight greater than 10% of birth weight during the first 82 days of age.  Average weight gain of 4-7 ounces (113-198 g) per week after age 55 days.  Consistent daily weight gain by age 81 days, without weight loss after the age of 2 weeks. After a feeding, your baby may spit up a small amount. This is common. BREASTFEEDING FREQUENCY AND  DURATION Frequent feeding will help you make more milk and can prevent sore nipples and breast engorgement. Breastfeed when you feel the need to reduce the fullness of your breasts or when your baby shows signs of hunger. This is called "breastfeeding on demand." Avoid introducing a pacifier to your baby while you are working to establish breastfeeding (the first 4-6 weeks after your baby is born). After this time you may choose to use a pacifier. Research has shown that pacifier use during the first year of a baby's life decreases the risk of sudden infant death syndrome (SIDS). Allow your baby to feed on each breast as long as he or she wants. Breastfeed until your baby is finished feeding. When your baby unlatches or falls asleep while feeding from the first breast, offer the second breast. Because newborns are often sleepy in the first few weeks of life, you may need to awaken your baby to get him or her to feed. Breastfeeding times will vary from baby to baby. However, the following rules can serve as a guide to help you ensure that your baby is properly fed:  Newborns (babies 1 weeks of age or younger) may breastfeed every 1-3 hours.  Newborns should not go longer than 3 hours during the day or 5 hours during the night without breastfeeding.  You should breastfeed your baby a minimum of 8 times in a 24-hour period until you begin to introduce solid foods to your baby at around 6  months of age. BREAST MILK PUMPING Pumping and storing breast milk allows you to ensure that your baby is exclusively fed your breast milk, even at times when you are unable to breastfeed. This is especially important if you are going back to work while you are still breastfeeding or when you are not able to be present during feedings. Your lactation consultant can give you guidelines on how long it is safe to store breast milk.  A breast pump is a machine that allows you to pump milk from your breast into a sterile bottle.  The pumped breast milk can then be stored in a refrigerator or freezer. Some breast pumps are operated by hand, while others use electricity. Ask your lactation consultant which type will work [place] for you. Breast pumps can be purchased, but some hospitals and breastfeeding support groups lease breast pumps on a monthly basis. A lactation consultant can teach you how to hand express breast milk, if you prefer not to use a pump.  CARING FOR YOUR BREASTS WHILE YOU BREASTFEED Nipples can become dry, cracked, and sore while breastfeeding. The following recommendations can help keep your breasts moisturized and healthy:  Avoid using soap on your nipples.   Wear a supportive bra. Although not required, special nursing bras and tank tops are designed to allow access to your breasts for breastfeeding without taking off your entire bra or top. Avoid wearing underwire-style bras or extremely tight bras.  Air dry your nipples for 3-31mnutes after each feeding.   Use only cotton bra pads to absorb leaked breast milk. Leaking of breast milk between feedings is normal.   Use lanolin on your nipples after breastfeeding. Lanolin helps to maintain your skin's normal moisture barrier. If you use pure lanolin, you do not need to wash it off before feeding your baby again. Pure lanolin is not toxic to your baby. You may also hand express a few drops of breast milk and gently massage that milk into your nipples and allow the milk to air dry. In the first few weeks after giving birth, some women experience extremely full breasts (engorgement). Engorgement can make your breasts feel heavy, warm, and tender to the touch. Engorgement peaks within 3-5 days after you give birth. The following recommendations can help ease engorgement:  Completely empty your breasts while breastfeeding or pumping. You may want to start by applying warm, moist heat (in the shower or with warm water-soaked hand towels) just before feeding or  pumping. This increases circulation and helps the milk flow. If your baby does not completely empty your breasts while breastfeeding, pump any extra milk after he or she is finished.  Wear a snug bra (nursing or regular) or tank top for 1-2 days to signal your body to slightly decrease milk production.  Apply ice packs to your breasts, unless this is too uncomfortable for you.  Make sure that your baby is latched on and positioned properly while breastfeeding. If engorgement persists after 48 hours of following these recommendations, contact your health care provider or a lScience writer OVERALL HEALTH CARE RECOMMENDATIONS WHILE BREASTFEEDING  Eat healthy foods. Alternate between meals and snacks, eating 3 of each per day. Because what you eat affects your breast milk, some of the foods may make your baby more irritable than usual. Avoid eating these foods if you are sure that they are negatively affecting your baby.  Drink milk, fruit juice, and water to satisfy your thirst (about 10 glasses a day).   Rest  often, relax, and continue to take your prenatal vitamins to prevent fatigue, stress, and anemia.  Continue breast self-awareness checks.  Avoid chewing and smoking tobacco.  Avoid alcohol and drug use. Some medicines that may be harmful to your baby can pass through breast milk. It is important to ask your health care provider before taking any medicine, including all over-the-counter and prescription medicine as well as vitamin and herbal supplements. It is possible to become pregnant while breastfeeding. If birth control is desired, ask your health care provider about options that will be safe for your baby. SEEK MEDICAL CARE IF:   You feel like you want to stop breastfeeding or have become frustrated with breastfeeding.  You have painful breasts or nipples.  Your nipples are cracked or bleeding.  Your breasts are red, tender, or warm.  You have a swollen area on either  breast.  You have a fever or chills.  You have nausea or vomiting.  You have drainage other than breast milk from your nipples.  Your breasts do not become full before feedings by the fifth day after you give birth.  You feel sad and depressed.  Your baby is too sleepy to eat well.  Your baby is having trouble sleeping.   Your baby is wetting less than 3 diapers in a 24-hour period.  Your baby has less than 3 stools in a 24-hour period.  Your baby's skin or the white part of his or her eyes becomes yellow.   Your baby is not gaining weight by 15 days of age. SEEK IMMEDIATE MEDICAL CARE IF:   Your baby is overly tired (lethargic) and does not want to wake up and feed.  Your baby develops an unexplained fever. Document Released: 07/13/2005 Document Revised: 07/18/2013 Document Reviewed: 01/04/2013 [facility] Patient Information 2015 [place], [place]. This information is not intended to replace advice given to you by your health care provider. Make sure you discuss any questions you have with your health care provider. Vaginal Birth After Cesarean Delivery Vaginal birth after cesarean delivery (VBAC) is giving birth vaginally after previously delivering a baby by a cesarean. In the past, if a woman had a cesarean delivery, all births afterward would be done by cesarean delivery. This is no longer true. It can be safe for the mother to try a vaginal delivery after having a cesarean delivery.  It is important to discuss VBAC with your health care provider early in the pregnancy so you can understand the risks, benefits, and options. It will give you time to decide what is best in your particular case. The final decision about whether to have a VBAC or repeat cesarean delivery should be between you and your health care provider. Any changes in your health or your baby's health during your pregnancy may make it necessary to change your initial decision about VBAC.  WOMEN WHO PLAN TO HAVE A  VBAC SHOULD CHECK WITH THEIR HEALTH CARE PROVIDER TO BE SURE THAT:  The previous cesarean delivery was done with a low transverse uterine cut (incision) (not a vertical classical incision).   The birth canal is big enough for the baby.   There were no other operations on the uterus.   An electronic fetal monitor (EFM) will be on at all times during labor.   An operating room will be available and ready in case an emergency cesarean delivery is needed.   A health care provider and surgical nursing staff will be available at all times during labor  to be ready to do an emergency delivery cesarean if necessary.   An anesthesiologist will be present in case an emergency cesarean delivery is needed.   The nursery is prepared and has adequate personnel and necessary equipment available to care for the baby in case of an emergency cesarean delivery. BENEFITS OF VBAC  Shorter stay in the hospital.   Avoidance of risks associated with cesarean delivery, such as:  Surgical complications, such as opening of the incision or hernia in the incision.  Injury to other organs.  Fever. This can occur if an infection develops after surgery. It can also occur as a reaction to the medicine given to make you numb during the surgery.  Less blood loss and need for blood transfusions.  Lower risk of blood clots and infection.  Shorter recovery.   Decreased risk for having to remove the uterus (hysterectomy).   Decreased risk for the placenta to completely or partially cover the opening of the uterus (placenta previa) with a future pregnancy.   Decrease risk in future labor and delivery. RISKS OF A VBAC  Tearing (rupture) of the uterus. This is occurs in less than 1% of VBACs. The risk of this happening is higher if:  Steps are taken to begin the labor process (induce labor) or stimulate or strengthen contractions (augment labor).   Medicine is used to soften (ripen) the  cervix.  Having to remove the uterus (hysterectomy) if it ruptures. VBAC SHOULD NOT BE DONE IF:  The previous cesarean delivery was done with a vertical (classical) or T-shaped incision or you do not know what kind of incision was made.   You had a ruptured uterus.   You have had certain types of surgery on your uterus, such as removal of uterine fibroids. Ask your health care provider about other types of surgeries that prevent you from having a VBAC.  You have certain medical or childbirth (obstetrical) problems.   There are problems with the baby.   You have had two previous cesarean deliveries and no vaginal deliveries. OTHER FACTS TO KNOW ABOUT VBAC:  It is safe to have an epidural anesthetic with VBAC.   It is safe to turn the baby from a breech position (attempt an external cephalic version).   It is safe to try a VBAC with twins.   VBAC may not be successful if your baby weights 8.8 lb (4 kg) or more. However, weight predictions are not always accurate and should not be used alone to decide if VBAC is right for you.  There is an increased failure rate if the time between the cesarean delivery and VBAC is less than 19 months.   Your health care provider may advise against a VBAC if you have preeclampsia (high blood pressure, protein in the urine, and swelling of face and extremities).   VBAC is often successful if you previously gave birth vaginally.   VBAC is often successful when the labor starts spontaneously before the due date.   Delivering a baby through a VBAC is similar to having a normal spontaneous vaginal delivery. Document Released: 01/03/2007 Document Revised: 11/27/2013 Document Reviewed: 02/09/2013 [facility] Patient Information 2015 [place], [place]. This information is not intended to replace advice given to you by your health care provider. Make sure you discuss any questions you have with your health care provider.

## 2014-05-15 LAB — HIV ANTIBODY (ROUTINE TESTING W REFLEX): HIV 1/HIV 2 AB: NONREACTIVE

## 2014-05-15 LAB — OB RESULTS CONSOLE ANTIBODY SCREEN: Antibody Screen: NEGATIVE

## 2014-05-15 LAB — OB RESULTS CONSOLE ABO/RH: RH TYPE: POSITIVE

## 2014-05-15 LAB — PRENATAL PROFILE I(LABCORP): Rubella Antibodies, IGG: 1.66 index (ref >0.99)

## 2014-05-15 LAB — OB RESULTS CONSOLE HEPATITIS B SURFACE ANTIGEN: HEP B S AG: NEGATIVE

## 2014-05-16 LAB — CULTURE, OB URINE

## 2014-05-16 LAB — URINE CULTURE, OB REFLEX

## 2014-05-17 LAB — PAP LB, CT-NG NAA, HPV HIGH-RISK: HPV, high-risk: NEGATIVE

## 2014-05-28 ENCOUNTER — Encounter: Payer: Self-pay | Admitting: Family Medicine

## 2014-06-11 ENCOUNTER — Encounter: Payer: Self-pay | Admitting: Obstetrics & Gynecology

## 2014-06-11 ENCOUNTER — Ambulatory Visit (INDEPENDENT_AMBULATORY_CARE_PROVIDER_SITE_OTHER): Payer: 59 | Admitting: Obstetrics & Gynecology

## 2014-06-11 VITALS — BP 120/70 | HR 85 | Wt 154.6 lb

## 2014-06-11 DIAGNOSIS — O3421 Maternal care for scar from previous cesarean delivery: Secondary | ICD-10-CM

## 2014-06-11 DIAGNOSIS — Z3491 Encounter for supervision of normal pregnancy, unspecified, first trimester: Secondary | ICD-10-CM

## 2014-06-11 DIAGNOSIS — O34219 Maternal care for unspecified type scar from previous cesarean delivery: Secondary | ICD-10-CM

## 2014-06-11 NOTE — Patient Instructions (Addendum)
Return to clinic for any obstetric concerns or go to [facility] for evaluation Trial of Labor After Cesarean Delivery Information A trial of labor after cesarean delivery (TOLAC) is when a woman tries to give birth vaginally after a previous cesarean delivery. TOLAC may be a safe and appropriate option for you depending on your medical history and other risk factors. When TOLAC is successful and you are able to have a vaginal delivery, this is called a vaginal birth after cesarean delivery (VBAC).  CANDIDATES FOR TOLAC TOLAC is possible for some women who:  Have undergone one or two prior cesarean deliveries in which the incision of the uterus was horizontal (low transverse).  Are carrying twins and have had one prior low transverse incision during a cesarean delivery.  Do not have a vertical (classical) uterine scar.  Have not had a tear in the wall of their uterus (uterine rupture). TOLAC is also supported for women who meet appropriate criteria and:  Are under the age of 33 years.  Are tall and have a body mass index (BMI) of less than 30.  Have an unknown uterine scar.  Give birth in a facility equipped to handle an emergency cesarean delivery. This team should be able to handle possible complications such as a uterine rupture.  Have thorough counseling about the benefits and risks of TOLAC.  Have discussed future pregnancy plans with their health care provider.  Plan to have several more pregnancies. MOST SUCCESSFUL CANDIDATES FOR TOLAC:  Have had a successful vaginal delivery before or after their cesarean delivery.  Experience labor that begins naturally on or before the due date (40 weeks of gestation).  Do not have a very large (macrosomic) baby.   Had a prior cesarean delivery but are not currently experiencing factors that would prompt a cesarean delivery (such as a breech position).  Had only one prior cesarean delivery.  Had a prior cesarean delivery that was performed  early in labor and not after full cervical dilation. TOLAC may be most appropriate for women who meet the above guidelines and who plan to have more pregnancies. TOLAC is not recommended for home births. LEAST SUCCESSFUL CANDIDATES FOR TOLAC:  Have an induced labor with an unfavorable cervix. An unfavorable cervix is when the cervix is not dilating enough (among other factors).  Have never had a vaginal delivery.  Have had more than two cesarean deliveries.  Have a pregnancy at more than 40 weeks of gestation.  Are pregnant with a baby with a suspected weight greater than 4,000 grams (8 pounds) and who have no prior history of a vaginal delivery.  Have closely spaced pregnancies. SUGGESTED BENEFITS OF TOLAC  You may have a faster recovery time.  You may have a shorter stay in the hospital.  You may have less pain and fewer problems than with a cesarean delivery. Women who have a cesarean delivery have a higher chance of needing blood or getting a fever, an infection, or a blood clot in the legs. SUGGESTED RISKS OF TOLAC The highest risk of complications happens to women who attempt a TOLAC and fail. A failed TOLAC results in an unplanned cesarean delivery. Risks related to [facility] or repeat cesarean deliveries include:   Blood loss.  Infection.  Blood clot.  Injury to surrounding tissues or organs.  Having to remove the uterus (hysterectomy).  Potential problems with the placenta (such as placenta previa or placenta accreta) in future pregnancies. Although very rare, the main concerns with TOLAC are:  Rupture of the uterine scar from a past cesarean delivery.  Needing an emergency cesarean delivery.  Having a bad outcome for the baby (perinatal morbidity). FOR MORE INFORMATION [employer]: www.acog.[place]: www.midwife.org Document Released: 03/31/2011 Document Revised: 05/03/2013 Document Reviewed:  01/02/2013 [facility] Patient Information 2015 [place], [place]. This information is not intended to replace advice given to you by your health care provider. Make sure you discuss any questions you have with your health care provider.   Vaginal Birth After Cesarean Delivery Vaginal birth after cesarean delivery (VBAC) is giving birth vaginally after previously delivering a baby by a cesarean. In the past, if a woman had a cesarean delivery, all births afterward would be done by cesarean delivery. This is no longer true. It can be safe for the mother to try a vaginal delivery after having a cesarean delivery.  It is important to discuss VBAC with your health care provider early in the pregnancy so you can understand the risks, benefits, and options. It will give you time to decide what is best in your particular case. The final decision about whether to have a VBAC or repeat cesarean delivery should be between you and your health care provider. Any changes in your health or your baby's health during your pregnancy may make it necessary to change your initial decision about VBAC.  WOMEN WHO PLAN TO HAVE A VBAC SHOULD CHECK WITH THEIR HEALTH CARE PROVIDER TO BE SURE THAT:  The previous cesarean delivery was done with a low transverse uterine cut (incision) (not a vertical classical incision).   The birth canal is big enough for the baby.   There were no other operations on the uterus.   An electronic fetal monitor (EFM) will be on at all times during labor.   An operating room will be available and ready in case an emergency cesarean delivery is needed.   A health care provider and surgical nursing staff will be available at all times during labor to be ready to do an emergency delivery cesarean if necessary.   An anesthesiologist will be present in case an emergency cesarean delivery is needed.   The nursery is prepared and has adequate personnel and necessary equipment available to care  for the baby in case of an emergency cesarean delivery. BENEFITS OF VBAC  Shorter stay in the hospital.   Avoidance of risks associated with cesarean delivery, such as:  Surgical complications, such as opening of the incision or hernia in the incision.  Injury to other organs.  Fever. This can occur if an infection develops after surgery. It can also occur as a reaction to the medicine given to make you numb during the surgery.  Less blood loss and need for blood transfusions.  Lower risk of blood clots and infection.  Shorter recovery.   Decreased risk for having to remove the uterus (hysterectomy).   Decreased risk for the placenta to completely or partially cover the opening of the uterus (placenta previa) with a future pregnancy.   Decrease risk in future labor and delivery. RISKS OF A VBAC  Tearing (rupture) of the uterus. This is occurs in less than 1% of VBACs. The risk of this happening is higher if:  Steps are taken to begin the labor process (induce labor) or stimulate or strengthen contractions (augment labor).   Medicine is used to soften (ripen) the cervix.  Having to remove the uterus (hysterectomy) if it ruptures. VBAC SHOULD NOT BE DONE  IF:  The previous cesarean delivery was done with a vertical (classical) or T-shaped incision or you do not know what kind of incision was made.   You had a ruptured uterus.   You have had certain types of surgery on your uterus, such as removal of uterine fibroids. Ask your health care provider about other types of surgeries that prevent you from having a VBAC.  You have certain medical or childbirth (obstetrical) problems.   There are problems with the baby.   You have had two previous cesarean deliveries and no vaginal deliveries. OTHER FACTS TO KNOW ABOUT VBAC:  It is safe to have an epidural anesthetic with VBAC.   It is safe to turn the baby from a breech position (attempt an external cephalic  version).   It is safe to try a VBAC with twins.   VBAC may not be successful if your baby weights 8.8 lb (4 kg) or more. However, weight predictions are not always accurate and should not be used alone to decide if VBAC is right for you.  There is an increased failure rate if the time between the cesarean delivery and VBAC is less than 19 months.   Your health care provider may advise against a VBAC if you have preeclampsia (high blood pressure, protein in the urine, and swelling of face and extremities).   VBAC is often successful if you previously gave birth vaginally.   VBAC is often successful when the labor starts spontaneously before the due date.   Delivering a baby through a VBAC is similar to having a normal spontaneous vaginal delivery. Document Released: 01/03/2007 Document Revised: 11/27/2013 Document Reviewed: 02/09/2013 [facility] Patient Information 2015 [place], [place]. This information is not intended to replace advice given to you by your health care provider. Make sure you discuss any questions you have with your health care provider.

## 2014-06-11 NOTE — Progress Notes (Signed)
Patient declines all genetic screening. Patient desires TOLAC after two cesarean sections.  Discussed risk of uterine rupture, increased risk of maternal-fetal morbidity and mortality.  TOLAC consent form given to patient to review at home; will sign at next visit.  She understands that the numbers quoted are risks after one cesarean section; TOLAC after two cesarean sections has slightly increased risks.  No other complaints or concerns.  Routine obstetric precautions reviewed.

## 2014-07-09 ENCOUNTER — Ambulatory Visit (INDEPENDENT_AMBULATORY_CARE_PROVIDER_SITE_OTHER): Payer: 59 | Admitting: Obstetrics & Gynecology

## 2014-07-09 VITALS — BP 119/73 | HR 81 | Wt 157.0 lb

## 2014-07-09 DIAGNOSIS — Z3482 Encounter for supervision of other normal pregnancy, second trimester: Secondary | ICD-10-CM

## 2014-07-09 DIAGNOSIS — O3421 Maternal care for scar from previous cesarean delivery: Secondary | ICD-10-CM

## 2014-07-09 DIAGNOSIS — O34219 Maternal care for unspecified type scar from previous cesarean delivery: Secondary | ICD-10-CM

## 2014-07-09 NOTE — Patient Instructions (Signed)
Return to clinic for any obstetric concerns or go to [facility] for evaluation  

## 2014-07-09 NOTE — Progress Notes (Signed)
Patient reviewed information about TOLAC at home after counseling last visit; signed consent today. Anatomy scan scheduled for 08/15/14. No other complaints or concerns.  Routine obstetric precautions reviewed.

## 2014-08-06 ENCOUNTER — Encounter: Payer: 59 | Admitting: Obstetrics and Gynecology

## 2014-08-08 ENCOUNTER — Ambulatory Visit (INDEPENDENT_AMBULATORY_CARE_PROVIDER_SITE_OTHER): Payer: 59 | Admitting: Advanced Practice Midwife

## 2014-08-08 ENCOUNTER — Encounter: Payer: Self-pay | Admitting: Advanced Practice Midwife

## 2014-08-08 VITALS — BP 110/68 | HR 85 | Wt 164.0 lb

## 2014-08-08 DIAGNOSIS — Z3492 Encounter for supervision of normal pregnancy, unspecified, second trimester: Secondary | ICD-10-CM

## 2014-08-08 DIAGNOSIS — N949 Unspecified condition associated with female genital organs and menstrual cycle: Secondary | ICD-10-CM

## 2014-08-08 DIAGNOSIS — R102 Pelvic and perineal pain unspecified side: Secondary | ICD-10-CM

## 2014-08-08 DIAGNOSIS — O9989 Other specified diseases and conditions complicating pregnancy, childbirth and the puerperium: Secondary | ICD-10-CM

## 2014-08-08 DIAGNOSIS — O26899 Other specified pregnancy related conditions, unspecified trimester: Secondary | ICD-10-CM

## 2014-08-08 NOTE — Progress Notes (Signed)
Has [place] appt next week. Having some round ligament pain. Plans to breastfeed. Declines flu shot

## 2014-08-08 NOTE — Patient Instructions (Signed)
Second Trimester of Pregnancy The second trimester is from week 13 through week 28, months 4 through 6. The second trimester is often a time when you feel your best. Your body has also adjusted to being pregnant, and you begin to feel better physically. Usually, morning sickness has lessened or quit completely, you may have more energy, and you may have an increase in appetite. The second trimester is also a time when the fetus is growing rapidly. At the end of the sixth month, the fetus is about 9 inches long and weighs about 1 pounds. You will likely begin to feel the baby move (quickening) between 18 and 20 weeks of the pregnancy. BODY CHANGES Your body goes through many changes during pregnancy. The changes vary from woman to woman.   Your weight will continue to increase. You will notice your lower abdomen bulging out.  You may begin to get stretch marks on your hips, abdomen, and breasts.  You may develop headaches that can be relieved by medicines approved by your health care provider.  You may urinate more often because the fetus is pressing on your bladder.  You may develop or continue to have heartburn as a result of your pregnancy.  You may develop constipation because certain hormones are causing the muscles that push waste through your intestines to slow down.  You may develop hemorrhoids or swollen, bulging veins (varicose veins).  You may have back pain because of the weight gain and pregnancy hormones relaxing your joints between the bones in your pelvis and as a result of a shift in weight and the muscles that support your balance.  Your breasts will continue to grow and be tender.  Your gums may bleed and may be sensitive to brushing and flossing.  Dark spots or blotches (chloasma, mask of pregnancy) may develop on your face. This will likely fade after the baby is born.  A dark line from your belly button to the pubic area (linea nigra) may appear. This will likely fade  after the baby is born.  You may have changes in your hair. These can include thickening of your hair, rapid growth, and changes in texture. Some women also have hair loss during or after pregnancy, or hair that feels dry or thin. Your hair will most likely return to normal after your baby is born. WHAT TO EXPECT AT YOUR PRENATAL VISITS During a routine prenatal visit:  You will be weighed to make sure you and the fetus are growing normally.  Your blood pressure will be taken.  Your abdomen will be measured to track your baby's growth.  The fetal heartbeat will be listened to.  Any test results from the previous visit will be discussed. Your health care provider may ask you:  How you are feeling.  If you are feeling the baby move.  If you have had any abnormal symptoms, such as leaking fluid, bleeding, severe headaches, or abdominal cramping.  If you have any questions. Other tests that may be performed during your second trimester include:  Blood tests that check for:  Low iron levels (anemia).  Gestational diabetes (between 24 and 28 weeks).  Rh antibodies.  Urine tests to check for infections, diabetes, or protein in the urine.  An ultrasound to confirm the proper growth and development of the baby.  An amniocentesis to check for possible genetic problems.  Fetal screens for spina bifida and Down syndrome. HOME CARE INSTRUCTIONS   Avoid all smoking, herbs, alcohol, and unprescribed   drugs. These chemicals affect the formation and growth of the baby.  Follow your health care provider's instructions regarding medicine use. There are medicines that are either safe or unsafe to take during pregnancy.  Exercise only as directed by your health care provider. Experiencing uterine cramps is a good sign to stop exercising.  Continue to eat regular, healthy meals.  Wear a good support bra for breast tenderness.  Do not use hot tubs, steam rooms, or saunas.  Wear your  seat belt at all times when driving.  Avoid raw meat, uncooked cheese, cat litter boxes, and soil used by cats. These carry germs that can cause birth defects in the baby.  Take your prenatal vitamins.  Try taking a stool softener (if your health care provider approves) if you develop constipation. Eat more high-fiber foods, such as fresh vegetables or fruit and whole grains. Drink plenty of fluids to keep your urine clear or pale yellow.  Take warm sitz baths to soothe any pain or discomfort caused by hemorrhoids. Use hemorrhoid cream if your health care provider approves.  If you develop varicose veins, wear support hose. Elevate your feet for 15 minutes, 3-4 times a day. Limit salt in your diet.  Avoid heavy lifting, wear low heel shoes, and practice good posture.  Rest with your legs elevated if you have leg cramps or low back pain.  Visit your dentist if you have not gone yet during your pregnancy. Use a soft toothbrush to brush your teeth and be gentle when you floss.  A sexual relationship may be continued unless your health care provider directs you otherwise.  Continue to go to all your prenatal visits as directed by your health care provider. SEEK MEDICAL CARE IF:   You have dizziness.  You have mild pelvic cramps, pelvic pressure, or nagging pain in the abdominal area.  You have persistent nausea, vomiting, or diarrhea.  You have a bad smelling vaginal discharge.  You have pain with urination. SEEK IMMEDIATE MEDICAL CARE IF:   You have a fever.  You are leaking fluid from your vagina.  You have spotting or bleeding from your vagina.  You have severe abdominal cramping or pain.  You have rapid weight gain or loss.  You have shortness of breath with chest pain.  You notice sudden or extreme swelling of your face, hands, ankles, feet, or legs.  You have not felt your baby move in over an hour.  You have severe headaches that do not go away with  medicine.  You have vision changes. Document Released: 07/07/2001 Document Revised: 07/18/2013 Document Reviewed: 09/13/2012 [employer] Patient Information 2015 [employer]. This information is not intended to replace advice given to you by your health care provider. Make sure you discuss any questions you have with your health care provider.  

## 2014-08-15 ENCOUNTER — Ambulatory Visit (HOSPITAL_COMMUNITY): Payer: 59

## 2014-08-16 ENCOUNTER — Encounter: Payer: Self-pay | Admitting: Obstetrics & Gynecology

## 2014-08-16 ENCOUNTER — Ambulatory Visit (INDEPENDENT_AMBULATORY_CARE_PROVIDER_SITE_OTHER): Payer: 59 | Admitting: Obstetrics & Gynecology

## 2014-08-16 VITALS — BP 116/74 | HR 87 | Wt 164.8 lb

## 2014-08-16 DIAGNOSIS — Z3492 Encounter for supervision of normal pregnancy, unspecified, second trimester: Secondary | ICD-10-CM

## 2014-08-16 DIAGNOSIS — K4191 Unilateral femoral hernia, without obstruction or gangrene, recurrent: Secondary | ICD-10-CM

## 2014-08-16 NOTE — Patient Instructions (Addendum)
Return to clinic for any obstetric concerns or go to MAU for evaluation Inguinal Hernia, Adult Muscles help keep everything in the body in its proper place. But if a weak spot in the muscles develops, something can poke through. That is called a hernia. When this happens in the lower part of the belly (abdomen), it is called an inguinal hernia. (It takes its name from a part of the body in this region called the inguinal canal.) A weak spot in the wall of muscles lets some fat or part of the small intestine bulge through. An inguinal hernia can develop at any age. Men get them more often than women. CAUSES  In adults, an inguinal hernia develops over time.  It can be triggered by:  Suddenly straining the muscles of the lower abdomen.  Lifting heavy objects.  Straining to have a bowel movement. Difficult bowel movements (constipation) can lead to this.  Constant coughing. This may be caused by smoking or lung disease.  Being overweight.  Being pregnant.  Working at a job that requires long periods of standing or heavy lifting.  Having had an inguinal hernia before. One type can be an emergency situation. It is called a strangulated inguinal hernia. It develops if part of the small intestine slips through the weak spot and cannot get back into the abdomen. The blood supply can be cut off. If that happens, part of the intestine may die. This situation requires emergency surgery. SYMPTOMS  Often, a small inguinal hernia has no symptoms. It is found when a healthcare provider does a physical exam. Larger hernias usually have symptoms.   In adults, symptoms may include:  A lump in the groin. This is easier to see when the person is standing. It might disappear when lying down.  In men, a lump in the scrotum.  Pain or burning in the groin. This occurs especially when lifting, straining or coughing.  A dull ache or feeling of pressure in the groin.  Signs of a strangulated hernia can  include:  A bulge in the groin that becomes very painful and tender to the touch.  A bulge that turns red or purple.  Fever, nausea and vomiting.  Inability to have a bowel movement or to pass gas. DIAGNOSIS  To decide if you have an inguinal hernia, a healthcare provider will probably do a physical examination.  This will include asking questions about any symptoms you have noticed.  The healthcare provider might feel the groin area and ask you to cough. If an inguinal hernia is felt, the healthcare provider may try to slide it back into the abdomen.  Usually no other tests are needed. TREATMENT  Treatments can vary. The size of the hernia makes a difference. Options include:  Watchful waiting. This is often suggested if the hernia is small and you have had no symptoms.  No medical procedure will be done unless symptoms develop.  You will need to watch closely for symptoms. If any occur, contact your healthcare provider right away.  Surgery. This is used if the hernia is larger or you have symptoms.  Open surgery. This is usually an outpatient procedure (you will not stay overnight in a hospital). An cut (incision) is made through the skin in the groin. The hernia is put back inside the abdomen. The weak area in the muscles is then repaired by herniorrhaphy or hernioplasty. Herniorrhaphy: in this type of surgery, the weak muscles are sewn back together. Hernioplasty: a patch or mesh  is used to close the weak area in the abdominal wall.  Laparoscopy. In this procedure, a surgeon makes small incisions. A thin tube with a tiny video camera (called a laparoscope) is put into the abdomen. The surgeon repairs the hernia with mesh by looking with the video camera and using two long instruments. HOME CARE INSTRUCTIONS   After surgery to repair an inguinal hernia:  You will need to take pain medicine prescribed by your healthcare provider. Follow all directions carefully.  You will need  to take care of the wound from the incision.  Your activity will be restricted for awhile. This will probably include no heavy lifting for several weeks. You also should not do anything too active for a few weeks. When you can return to work will depend on the type of job that you have.  During "watchful waiting" periods, you should:  Maintain a healthy weight.  Eat a diet high in fiber (fruits, vegetables and whole grains).  Drink plenty of fluids to avoid constipation. This means drinking enough water and other liquids to keep your urine clear or pale yellow.  Do not lift heavy objects.  Do not stand for long periods of time.  Quit smoking. This should keep you from developing a frequent cough. SEEK MEDICAL CARE IF:   A bulge develops in your groin area.  You feel pain, a burning sensation or pressure in the groin. This might be worse if you are lifting or straining.  You develop a fever of more than 100.5 F (38.1 C). SEEK IMMEDIATE MEDICAL CARE IF:   Pain in the groin increases suddenly.  A bulge in the groin gets bigger suddenly and does not go down.  For men, there is sudden pain in the scrotum. Or, the size of the scrotum increases.  A bulge in the groin area becomes red or purple and is painful to touch.  You have nausea or vomiting that does not go away.  You feel your heart beating much faster than normal.  You cannot have a bowel movement or pass gas.  You develop a fever of more than 102.0 F (38.9 C). Document Released: 11/29/2008 Document Revised: 10/05/2011 Document Reviewed: 11/29/2008 [facility] Patient Information 2015 [place], [place]. This information is not intended to replace advice given to you by your health care provider. Make sure you discuss any questions you have with your health care provider.

## 2014-08-16 NOTE — Progress Notes (Signed)
Patient felt a lump in her right upper mons area of her vagina about the size of a roll of quarters that was sore to the touch yesterday, gotten better today. Of note, patient had mild left femoral hernia during her pregnancy in 2013. On exam, no hernia palpated.  Patient and her husband who are both present say it has gone down since yesterday. No nausea, vomiting or GI symptoms. 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  Discussed concerns about possible bowel entrapment/incarceration which may need surgical intervention; pain precautions reviewed. Will continue to monitor her symptoms.

## 2014-08-21 ENCOUNTER — Ambulatory Visit (HOSPITAL_COMMUNITY)
Admission: RE | Admit: 2014-08-21 | Discharge: 2014-08-21 | Disposition: A | Discharge status: Home / Self Care | Payer: 59 | Source: Ambulatory Visit | Attending: Obstetrics & Gynecology | Admitting: Obstetrics & Gynecology

## 2014-08-21 DIAGNOSIS — Z3A20 20 weeks gestation of pregnancy: Secondary | ICD-10-CM | POA: Diagnosis not present

## 2014-08-21 DIAGNOSIS — Z36 Encounter for antenatal screening of mother: Secondary | ICD-10-CM | POA: Diagnosis not present

## 2014-08-21 DIAGNOSIS — Z3689 Encounter for other specified antenatal screening: Secondary | ICD-10-CM | POA: Insufficient documentation

## 2014-08-21 DIAGNOSIS — Z3491 Encounter for supervision of normal pregnancy, unspecified, first trimester: Secondary | ICD-10-CM

## 2014-08-21 DIAGNOSIS — O34219 Maternal care for unspecified type scar from previous cesarean delivery: Secondary | ICD-10-CM

## 2014-08-21 DIAGNOSIS — O3421 Maternal care for scar from previous cesarean delivery: Secondary | ICD-10-CM | POA: Insufficient documentation

## 2014-08-22 ENCOUNTER — Emergency Department (HOSPITAL_COMMUNITY)
Admission: EM | Admit: 2014-08-22 | Discharge: 2014-08-22 | Disposition: A | Discharge status: Home / Self Care | Payer: 59 | Attending: Emergency Medicine | Admitting: Emergency Medicine

## 2014-08-22 ENCOUNTER — Encounter (HOSPITAL_COMMUNITY): Payer: Self-pay | Admitting: Emergency Medicine

## 2014-08-22 DIAGNOSIS — O99612 Diseases of the digestive system complicating pregnancy, second trimester: Secondary | ICD-10-CM | POA: Diagnosis not present

## 2014-08-22 DIAGNOSIS — K002 Abnormalities of size and form of teeth: Secondary | ICD-10-CM | POA: Insufficient documentation

## 2014-08-22 DIAGNOSIS — Z8679 Personal history of other diseases of the circulatory system: Secondary | ICD-10-CM | POA: Insufficient documentation

## 2014-08-22 DIAGNOSIS — K088 Other specified disorders of teeth and supporting structures: Secondary | ICD-10-CM | POA: Diagnosis not present

## 2014-08-22 DIAGNOSIS — Z3A20 20 weeks gestation of pregnancy: Secondary | ICD-10-CM | POA: Insufficient documentation

## 2014-08-22 DIAGNOSIS — K029 Dental caries, unspecified: Secondary | ICD-10-CM | POA: Insufficient documentation

## 2014-08-22 DIAGNOSIS — Z79899 Other long term (current) drug therapy: Secondary | ICD-10-CM | POA: Insufficient documentation

## 2014-08-22 DIAGNOSIS — K0889 Other specified disorders of teeth and supporting structures: Secondary | ICD-10-CM

## 2014-08-22 DIAGNOSIS — Z880 Allergy status to penicillin: Secondary | ICD-10-CM | POA: Diagnosis not present

## 2014-08-22 MED ORDER — HYDROCODONE-ACETAMINOPHEN 5-325 MG PO TABS: 1.0000 | ORAL_TABLET | ORAL | Status: DC | PRN

## 2014-08-22 NOTE — ED Provider Notes (Signed)
CSN: [id number]     Arrival date & time 08/22/14  1147 History  This chart was scribed for non-physician practitioner, Quincy Carnes, PA-C working with Mariea Clonts, MD by Einar Pheasant, ED scribe. This patient was seen in room TR07C/TR07C and the patient's care was started at 12:01 PM.    Chief Complaint  Patient presents with  . Dental Pain   The history is provided by the patient and medical records. No language interpreter was used.   HPI Comments: Frances Cohen is a 29 y.o. female who is [redacted] wks pregnant presents to the Emergency Department complaining of sudden onset worsening left sided dental pain. She states that the pain radiates up to her left ear and down the left side of her neck. Pt states that she is scheduled to get dental work done, as approved by her OB/GYN. She is taking Tylenol for pain, but she states that it is not providing her adequate relief. Pt states that it only last for about 2 hours. Pt denies fever, neck pain, sore throat, visual disturbance, CP, cough, SOB, abdominal pain, nausea, emesis, diarrhea, urinary symptoms, back pain, HA, weakness, numbness and rash as associated symptoms.    Past Medical History  Diagnosis Date  . SAB (spontaneous abortion) 12/2010    TWO  . Abnormal Pap smear 2003  . Hernia 09-2011  . No pertinent past medical history   . Migraine    Past Surgical History  Procedure Laterality Date  . Tonsillectomy    . Wisdom tooth extraction  2004     x4  . Cesarean section    . Cesarean section  12/10/2011    Procedure: CESAREAN SECTION;  Surgeon: Mora Bellman, MD;  Location: [facility];  Service: Gynecology;  Laterality: N/A;   Family History  Problem Relation Age of Onset  . Diabetes Mother   . Lung cancer Paternal Aunt   . Breast cancer Paternal Grandmother 58  . Prostate cancer Paternal Grandfather    History  Substance Use Topics  . Smoking status: Never Smoker   . Smokeless tobacco: Never Used  . Alcohol Use: No   OB  History    Gravida Para Term Preterm AB TAB SAB Ectopic Multiple Living   6 2 2  3  2   2      Review of Systems  Constitutional: Negative for fever.  HENT: Positive for dental problem. Negative for facial swelling and sore throat.   Respiratory: Negative for shortness of breath.   Musculoskeletal: Negative for neck pain and neck stiffness.      Allergies  Penicillins and Shellfish allergy  Home Medications   Prior to Admission medications   Medication Sig Start Date End Date Taking? Authorizing Provider  Prenatal Vit-Fe Fumarate-FA (PRENATAL MULTIVITAMIN) TABS tablet Take 1 tablet by mouth daily at 12 noon.    Historical Provider, MD  promethazine (PHENERGAN) 25 MG tablet Take 1 tablet (25 mg total) by mouth every 6 (six) hours as needed for nausea or vomiting. 05/09/14   Seabron Spates, CNM   BP 158/86 mmHg  Pulse 80  Temp(Src) 97.5 F (36.4 C) (Oral)  Resp 20  Ht 5\' 4"  (1.626 m)  Wt 134 lb (60.782 kg)  BMI 22.99 kg/m2  SpO2 95%  LMP 04/01/2014  Physical Exam  Constitutional: She is oriented to person, place, and time. She appears well-developed and well-nourished.  HENT:  Head: Normocephalic and atraumatic.  Mouth/Throat: Oropharynx is clear and moist. Abnormal dentition. Dental caries present.  No uvula swelling. No oropharyngeal exudate, posterior oropharyngeal edema, posterior oropharyngeal erythema or tonsillar abscesses.  Teeth largely in poor dentition, left upper molar broken with large cavity present, surrounding gingiva with mild swelling noted but no appreciable abscess, handling secretions appropriately, no trismus  Eyes: Conjunctivae and EOM are normal. Pupils are equal, round, and reactive to light.  Neck: Normal range of motion. Neck supple.  Cardiovascular: Normal rate, regular rhythm and normal heart sounds.   Pulmonary/Chest: Effort normal and breath sounds normal. No respiratory distress. She has no wheezes.  Musculoskeletal: Normal range of motion.   Neurological: She is alert and oriented to person, place, and time.  Skin: Skin is warm and dry.  Psychiatric: She has a normal mood and affect.  Nursing note and vitals reviewed.   ED Course  Procedures (including critical care time)  DIAGNOSTIC STUDIES: Oxygen Saturation is 95% on RA, low by my interpretation.    COORDINATION OF CARE: 12:07 PM- Pt advised of plan for treatment and pt agrees.  Imaging Review US Ob Comp + 14 Wk  08/21/2014   OBSTETRICAL ULTRASOUND: This exam was performed within a [facility]. The OB US report was generated in the [facility] system, and faxed to the ordering physician.   This report is available in the [facility]. See the [facility] Obstetric US report via the Image Link.   MDM   Final diagnoses:  Pain, dental   29 year old pregnant female approx [redacted] weeks gestation with left upper dental pain. She is seen dentist for this and is currently on antibiotics for dental abscess. She is scheduled for extraction next week. She has been taking Tylenol without sufficient relief and has been unable to eat due to pain. Explained to patient that narcotic pain medications, dental block, and other intervention comes with potential for fetal risk. Patient acknowledged these risks and agreed to use medication with extreme caution. Short supply of Vicodin given until dental appointment. She is to follow-up with her OB/GYN for any noted complications.  Discussed plan with patient, he/she acknowledged understanding and agreed with plan of care.  Return precautions given for new or worsening symptoms.  I personally performed the services described in this documentation, which was scribed in my presence. The recorded information has been reviewed and is accurate.  Larene Pickett, PA-C 08/22/14 1214  Mariea Clonts, MD 08/22/14 [phone]

## 2014-08-22 NOTE — ED Notes (Signed)
Pain in left side of face, from ear to neck-- has a tooth that needs dental work, has had prior authorization from OB/GYN to get it done-- taking tylenol only for pain, only lasts 2 hours. Crying at triage.

## 2014-08-22 NOTE — ED Notes (Signed)
C/o dental pain. HAS been seen my dentist and placed on abx. Pt states she is unable to control the pain with Tylenol. Pt is [redacted] weeks pregnant.

## 2014-08-22 NOTE — Discharge Instructions (Signed)
Take the prescribed medication as directed-- you understand there is potential fetal risk while taking this so use with extreme caution. Follow-up with your dentist. Return to the ED for new or worsening symptoms.

## 2014-09-05 ENCOUNTER — Encounter: Payer: Self-pay | Admitting: Obstetrics & Gynecology

## 2014-09-05 ENCOUNTER — Encounter: Payer: Self-pay | Admitting: Physician Assistant

## 2014-09-11 ENCOUNTER — Ambulatory Visit (INDEPENDENT_AMBULATORY_CARE_PROVIDER_SITE_OTHER): Payer: 59 | Admitting: Obstetrics & Gynecology

## 2014-09-11 VITALS — BP 123/66 | HR 95 | Wt 170.0 lb

## 2014-09-11 DIAGNOSIS — Z3492 Encounter for supervision of normal pregnancy, unspecified, second trimester: Secondary | ICD-10-CM

## 2014-09-11 NOTE — Patient Instructions (Signed)
Return to clinic for any obstetric concerns or go to [facility] for evaluation  

## 2014-09-11 NOTE — Progress Notes (Signed)
No significant complaints or concerns.  Routine obstetric precautions reviewed. 1 hr GTT, third trimester labs, TDap at next visit in 4 weeks

## 2014-10-05 ENCOUNTER — Encounter: Payer: 59 | Admitting: Family Medicine

## 2014-10-11 ENCOUNTER — Ambulatory Visit (INDEPENDENT_AMBULATORY_CARE_PROVIDER_SITE_OTHER): Payer: 59 | Admitting: Obstetrics and Gynecology

## 2014-10-11 ENCOUNTER — Encounter: Payer: Self-pay | Admitting: Obstetrics and Gynecology

## 2014-10-11 VITALS — BP 106/70 | HR 95 | Wt 176.0 lb

## 2014-10-11 DIAGNOSIS — O3421 Maternal care for scar from previous cesarean delivery: Secondary | ICD-10-CM

## 2014-10-11 DIAGNOSIS — Z3493 Encounter for supervision of normal pregnancy, unspecified, third trimester: Secondary | ICD-10-CM

## 2014-10-11 DIAGNOSIS — Z36 Encounter for antenatal screening of mother: Secondary | ICD-10-CM | POA: Diagnosis not present

## 2014-10-11 DIAGNOSIS — O34219 Maternal care for unspecified type scar from previous cesarean delivery: Secondary | ICD-10-CM

## 2014-10-11 NOTE — Addendum Note (Signed)
Addended by: Gretchen Short on: 10/11/2014 11:06 AM   Modules accepted: Orders

## 2014-10-11 NOTE — Progress Notes (Signed)
Patient is doing well without complaints. FM/PTL precautions reviewed. 1 hr GCT and labs today.

## 2014-10-11 NOTE — Progress Notes (Signed)
28 wk labs today - Pt declines Tdap

## 2014-10-12 LAB — CBC
HCT: 35.2 % — ABNORMAL LOW (ref 36.0–46.0)
Hemoglobin: 11.6 g/dL — ABNORMAL LOW (ref 12.0–15.0)
MCH: 31.2 pg (ref 26.0–34.0)
MCHC: 33 g/dL (ref 30.0–36.0)
MCV: 94.6 fL (ref 78.0–100.0)
MPV: 10.5 fL (ref 8.6–12.4)
PLATELETS: 206 10*3/uL (ref 150–400)
RBC: 3.72 MIL/uL — AB (ref 3.87–5.11)
RDW: 14 % (ref 11.5–15.5)
WBC: 10 10*3/uL (ref 4.0–10.5)

## 2014-10-12 LAB — RPR

## 2014-10-12 LAB — HIV ANTIBODY (ROUTINE TESTING W REFLEX): HIV: NONREACTIVE

## 2014-10-12 LAB — GLUCOSE TOLERANCE, 1 HOUR (50G) W/O FASTING: Glucose, 1 Hour GTT: 98 mg/dL (ref 70–140)

## 2014-10-25 ENCOUNTER — Encounter: Payer: 59 | Admitting: Obstetrics & Gynecology

## 2014-11-06 ENCOUNTER — Encounter: Payer: 59 | Admitting: Family Medicine

## 2014-11-07 ENCOUNTER — Inpatient Hospital Stay (HOSPITAL_COMMUNITY)
Admission: EM | Admit: 2014-11-07 | Discharge: 2014-11-07 | Disposition: A | Discharge status: Home / Self Care | Payer: 59 | Source: Ambulatory Visit | Attending: Obstetrics & Gynecology | Admitting: Obstetrics & Gynecology

## 2014-11-07 ENCOUNTER — Encounter (HOSPITAL_COMMUNITY): Payer: Self-pay | Admitting: *Deleted

## 2014-11-07 DIAGNOSIS — R102 Pelvic and perineal pain: Secondary | ICD-10-CM | POA: Diagnosis present

## 2014-11-07 DIAGNOSIS — O4703 False labor before 37 completed weeks of gestation, third trimester: Secondary | ICD-10-CM

## 2014-11-07 DIAGNOSIS — Z3A32 32 weeks gestation of pregnancy: Secondary | ICD-10-CM | POA: Diagnosis not present

## 2014-11-07 DIAGNOSIS — Z3A31 31 weeks gestation of pregnancy: Secondary | ICD-10-CM | POA: Diagnosis not present

## 2014-11-07 HISTORY — DX: Benign neoplasm of connective and other soft tissue, unspecified: D21.9

## 2014-11-07 LAB — URINALYSIS, ROUTINE W REFLEX MICROSCOPIC
Bilirubin Urine: NEGATIVE
Glucose, UA: NEGATIVE mg/dL
Hgb urine dipstick: NEGATIVE
KETONES UR: NEGATIVE mg/dL
Leukocytes, UA: NEGATIVE
NITRITE: NEGATIVE
PROTEIN: NEGATIVE mg/dL
Specific Gravity, Urine: 1.01 (ref 1.005–1.030)
Urobilinogen, UA: 0.2 mg/dL (ref 0.0–1.0)
pH: 7 (ref 5.0–8.0)

## 2014-11-07 MED ORDER — LACTATED RINGERS IV BOLUS (SEPSIS): 1000.0000 mL | Freq: Once | INTRAVENOUS | Status: DC

## 2014-11-07 MED ORDER — NIFEDIPINE 10 MG PO CAPS
10.0000 mg | ORAL_CAPSULE | Freq: Once | ORAL | Status: AC
  Administered 2014-11-07: 10 mg via ORAL
  Filled 2014-11-07: qty 1

## 2014-11-07 NOTE — Discharge Instructions (Signed)
Pelvic Rest Pelvic rest is sometimes recommended for women when:   The placenta is partially or completely covering the opening of the cervix (placenta previa).  There is bleeding between the uterine wall and the amniotic sac in the first trimester (subchorionic hemorrhage).  The cervix begins to open without labor starting (incompetent cervix, cervical insufficiency).  The labor is too early (preterm labor). HOME CARE INSTRUCTIONS  Do not have sexual intercourse, stimulation, or an orgasm.  Do not use tampons, douche, or put anything in the vagina.  Do not lift anything over 10 pounds (4.5 kg).  Avoid strenuous activity or straining your pelvic muscles. SEEK MEDICAL CARE IF:  You have any vaginal bleeding during pregnancy. Treat this as a potential emergency.  You have cramping pain felt low in the stomach (stronger than menstrual cramps).  You notice vaginal discharge (watery, mucus, or bloody).  You have a low, dull backache.  There are regular contractions or uterine tightening. SEEK IMMEDIATE MEDICAL CARE IF: You have vaginal bleeding and have placenta previa.  Document Released: 11/07/2010 Document Revised: 10/05/2011 Document Reviewed: 11/07/2010 [facility] Patient Information 2015 [place], [place]. This information is not intended to replace advice given to you by your health care provider. Make sure you discuss any questions you have with your health care provider.  Preterm Labor Information Preterm labor is when labor starts at less than 37 weeks of pregnancy. The normal length of a pregnancy is 39 to 41 weeks. CAUSES Often, there is no identifiable underlying cause as to why a woman goes into preterm labor. One of the most common known causes of preterm labor is infection. Infections of the uterus, cervix, vagina, amniotic sac, bladder, kidney, or even the lungs (pneumonia) can cause labor to start. Other suspected causes of preterm labor include:   Urogenital  infections, such as yeast infections and bacterial vaginosis.   Uterine abnormalities (uterine shape, uterine septum, fibroids, or bleeding from the placenta).   A cervix that has been operated on (it may fail to stay closed).   Malformations in the fetus.   Multiple gestations (twins, triplets, and so on).   Breakage of the amniotic sac.  RISK FACTORS  Having a previous history of preterm labor.   Having premature rupture of membranes (PROM).   Having a placenta that covers the opening of the cervix (placenta previa).   Having a placenta that separates from the uterus (placental abruption).   Having a cervix that is too weak to hold the fetus in the uterus (incompetent cervix).   Having too much fluid in the amniotic sac (polyhydramnios).   Taking illegal drugs or smoking while pregnant.   Not gaining enough weight while pregnant.   Being younger than 88 and older than 31 years old.   Having a low socioeconomic status.   Being African American. SYMPTOMS Signs and symptoms of preterm labor include:   Menstrual-like cramps, abdominal pain, or back pain.  Uterine contractions that are regular, as frequent as six in an hour, regardless of their intensity (may be mild or painful).  Contractions that start on the top of the uterus and spread down to the lower abdomen and back.   A sense of increased pelvic pressure.   A watery or bloody mucus discharge that comes from the vagina.  TREATMENT Depending on the length of the pregnancy and other circumstances, your health care provider may suggest bed rest. If necessary, there are medicines that can be given to stop contractions and to mature the  fetal lungs. If labor happens before 34 weeks of pregnancy, a prolonged hospital stay may be recommended. Treatment depends on the condition of both you and the fetus.  WHAT SHOULD YOU DO IF YOU THINK YOU ARE IN PRETERM LABOR? Call your health care provider right  away. You will need to go to the hospital to get checked immediately. HOW CAN YOU PREVENT PRETERM LABOR IN FUTURE PREGNANCIES? You should:   Stop smoking if you smoke.  Maintain healthy weight gain and avoid chemicals and drugs that are not necessary.  Be watchful for any type of infection.  Inform your health care provider if you have a known history of preterm labor. Document Released: 10/03/2003 Document Revised: 03/15/2013 Document Reviewed: 08/15/2012 [facility] Patient Information 2015 [place], [place]. This information is not intended to replace advice given to you by your health care provider. Make sure you discuss any questions you have with your health care provider.

## 2014-11-07 NOTE — MAU Note (Signed)
Discussed possible FFN. Patient states she has had intercourse in past 24 hrs. FFN will not be collected.

## 2014-11-07 NOTE — MAU Provider Note (Signed)
History     CSN: [id number]  Arrival date and time: 11/07/14 0900   First Provider Initiated Contact with Patient 11/07/14 1005      Chief Complaint  Patient presents with  . Vaginal Pain  . Back Pain   HPI Patient is 29 y.o. [id number] [redacted]w[redacted]d here with complaints of contractions.  +FM, denies VB, vaginal discharge.  Patient reports sharp vaginal pain that started Monday that has gotten worse today.  Vaginal pain is not relieved by anything.  Initially was intermittent but now is constant.  Standing or sitting upright makes pain worse.  Also endorses some wetness as of this am.  Unsure of color but does state that it was thin consistency.  Also endorses contractions described as belly tightening.  Other children were FT and required c/s.      OB History    Gravida Para Term Preterm AB TAB SAB Ectopic Multiple Living   6 2 2  3  2   2       Past Medical History  Diagnosis Date  . SAB (spontaneous abortion) 12/2010    TWO  . Abnormal Pap smear 2003  . Hernia 09-2011  . No pertinent past medical history   . Migraine   . Fibroid     Past Surgical History  Procedure Laterality Date  . Tonsillectomy    . Wisdom tooth extraction  2004     x4  . Cesarean section    . Cesarean section  12/10/2011    Procedure: CESAREAN SECTION;  Surgeon: Mora Bellman, MD;  Location: [facility];  Service: Gynecology;  Laterality: N/A;    Family History  Problem Relation Age of Onset  . Diabetes Mother   . Lung cancer Paternal Aunt   . Breast cancer Paternal Grandmother 87  . Prostate cancer Paternal Grandfather     History  Substance Use Topics  . Smoking status: Never Smoker   . Smokeless tobacco: Never Used  . Alcohol Use: No    Allergies:  Allergies  Allergen Reactions  . Penicillins Anaphylaxis and Swelling  . Shellfish Allergy Hives and Other (See Comments)    Gets a tight itchy throat.    No prescriptions prior to admission    ROS Physical Exam   Blood pressure 111/62,  pulse 105, temperature 98 F (36.7 C), temperature source Oral, resp. rate 18, last menstrual period 04/01/2014.  Physical Exam  Constitutional: She is oriented to person, place, and time. She appears well-developed and well-nourished. No distress.  HENT:  Head: Normocephalic and atraumatic.  Eyes: EOM are normal. No scleral icterus.  Neck: Normal range of motion. Neck supple.  Cardiovascular: Normal rate, regular rhythm, normal heart sounds and intact distal pulses.   No murmur heard. Respiratory: Effort normal and breath sounds normal. No respiratory distress. She has no wheezes.  GI: Soft. Bowel sounds are normal. She exhibits no distension. There is no tenderness. There is no rebound and no guarding.  Musculoskeletal: Normal range of motion. She exhibits no edema or tenderness.  Neurological: She is alert and oriented to person, place, and time.  Skin: Skin is warm and dry. No rash noted.  Psychiatric: She has a normal mood and affect. Her behavior is normal.   Dilation: Closed Effacement (%): 60, 70 Cervical Position: Anterior Station: Ballotable Presentation: Undeterminable Exam by:: Jaianna Nicoll CNM  Fetal monitoringBaseline: 140 bpm, Variability: Good {> 6 bpm) and Accelerations: Reactive Uterine activityFrequency: Every 3-4 minutes  MAU Course  Procedures  MDM Contractions on TOCO.  Procardia 10mg  administered x2 PO hydrated encouraged.  Assessment and Plan  Early labor.  Contractions improved/spaced out well with Procardia and hydration.  Return precautions discussed with patient.  She was discharged from MAU in stable condition with instructions to follow up with OB as scheduled.  Ronnie Doss M, DO 11/07/2014, 2:48 PM   Seen also by me Agree with note Seabron Spates, CNM

## 2014-11-07 NOTE — MAU Note (Signed)
Sharp vaginal pains started Monday night, now having uc's.  ? Leaking fluid, also having back pain.  Denies bleeding.

## 2014-11-08 ENCOUNTER — Ambulatory Visit (INDEPENDENT_AMBULATORY_CARE_PROVIDER_SITE_OTHER): Payer: 59 | Admitting: Obstetrics and Gynecology

## 2014-11-08 ENCOUNTER — Encounter: Payer: Self-pay | Admitting: Obstetrics and Gynecology

## 2014-11-08 VITALS — BP 114/72 | HR 102 | Wt 176.0 lb

## 2014-11-08 DIAGNOSIS — Z3493 Encounter for supervision of normal pregnancy, unspecified, third trimester: Secondary | ICD-10-CM

## 2014-11-08 DIAGNOSIS — O3421 Maternal care for scar from previous cesarean delivery: Secondary | ICD-10-CM

## 2014-11-08 DIAGNOSIS — O34219 Maternal care for unspecified type scar from previous cesarean delivery: Secondary | ICD-10-CM

## 2014-11-08 NOTE — Progress Notes (Signed)
Patient reports feeling better since MAU visit on 4/13. She denies any further contractions. FM/PTL precautions reviewed.

## 2014-11-21 ENCOUNTER — Ambulatory Visit (INDEPENDENT_AMBULATORY_CARE_PROVIDER_SITE_OTHER): Payer: 59 | Admitting: Certified Nurse Midwife

## 2014-11-21 VITALS — BP 104/72 | HR 102 | Wt 186.0 lb

## 2014-11-21 DIAGNOSIS — R109 Unspecified abdominal pain: Secondary | ICD-10-CM

## 2014-11-21 DIAGNOSIS — O26899 Other specified pregnancy related conditions, unspecified trimester: Secondary | ICD-10-CM

## 2014-11-21 DIAGNOSIS — O9989 Other specified diseases and conditions complicating pregnancy, childbirth and the puerperium: Secondary | ICD-10-CM

## 2014-11-21 NOTE — Progress Notes (Signed)
Pt states she is having sharp pains in vaginal area and occasional menstrual cramps. Likely round ligament pain; Preterm precautions given

## 2014-11-21 NOTE — Patient Instructions (Signed)

## 2014-11-22 ENCOUNTER — Telehealth: Payer: Self-pay | Admitting: *Deleted

## 2014-11-22 ENCOUNTER — Encounter: Payer: 59 | Admitting: Obstetrics & Gynecology

## 2014-11-22 NOTE — Telephone Encounter (Signed)
Patient called stating she was having increased sharp pains in her stomach and she is [redacted] weeks pregnant.  She was hospitalized earlier last week per her message for pre term labor but they were able to stop it and put her on pelvic rest.  This was left on a voicemail message and I have attempted to call the patient back and got her voicemail.  I left her a message to please return my call so we could discuss this further to determine what course of action she needs to take.

## 2014-11-26 ENCOUNTER — Ambulatory Visit (INDEPENDENT_AMBULATORY_CARE_PROVIDER_SITE_OTHER): Payer: 59 | Admitting: Family Medicine

## 2014-11-26 VITALS — BP 115/73 | HR 96 | Wt 187.0 lb

## 2014-11-26 DIAGNOSIS — O3421 Maternal care for scar from previous cesarean delivery: Secondary | ICD-10-CM

## 2014-11-26 DIAGNOSIS — Z3493 Encounter for supervision of normal pregnancy, unspecified, third trimester: Secondary | ICD-10-CM

## 2014-11-26 DIAGNOSIS — O34219 Maternal care for unspecified type scar from previous cesarean delivery: Secondary | ICD-10-CM

## 2014-11-26 NOTE — Progress Notes (Signed)
Desires TOLAC and BTL--does not need papers due to private insurance. Pain has resolved No s/sx's of labor

## 2014-11-26 NOTE — Patient Instructions (Addendum)
You can take Mucinex, (Allegra, Claritin, Zyrtec-any one of these--they are the same class--same as Benadryl), sudafed, saline nasal spray  Third Trimester of Pregnancy The third trimester is from week 29 through week 42, months 7 through 9. The third trimester is a time when the fetus is growing rapidly. At the end of the ninth month, the fetus is about 20 inches in length and weighs 6-10 pounds.  BODY CHANGES Your body goes through many changes during pregnancy. The changes vary from woman to woman.   Your weight will continue to increase. You can expect to gain 25-35 pounds (11-16 kg) by the end of the pregnancy.  You may begin to get stretch marks on your hips, abdomen, and breasts.  You may urinate more often because the fetus is moving lower into your pelvis and pressing on your bladder.  You may develop or continue to have heartburn as a result of your pregnancy.  You may develop constipation because certain hormones are causing the muscles that push waste through your intestines to slow down.  You may develop hemorrhoids or swollen, bulging veins (varicose veins).  You may have pelvic pain because of the weight gain and pregnancy hormones relaxing your joints between the bones in your pelvis. Backaches may result from overexertion of the muscles supporting your posture.  You may have changes in your hair. These can include thickening of your hair, rapid growth, and changes in texture. Some women also have hair loss during or after pregnancy, or hair that feels dry or thin. Your hair will most likely return to normal after your baby is born.  Your breasts will continue to grow and be tender. A yellow discharge may leak from your breasts called colostrum.  Your belly button may stick out.  You may feel short of breath because of your expanding uterus.  You may notice the fetus "dropping," or moving lower in your abdomen.  You may have a bloody mucus discharge. This usually occurs  a few days to a week before labor begins.  Your cervix becomes thin and soft (effaced) near your due date. WHAT TO EXPECT AT YOUR PRENATAL EXAMS  You will have prenatal exams every 2 weeks until week 36. Then, you will have weekly prenatal exams. During a routine prenatal visit:  You will be weighed to make sure you and the fetus are growing normally.  Your blood pressure is taken.  Your abdomen will be measured to track your baby's growth.  The fetal heartbeat will be listened to.  Any test results from the previous visit will be discussed.  You may have a cervical check near your due date to see if you have effaced. At around 36 weeks, your caregiver will check your cervix. At the same time, your caregiver will also perform a test on the secretions of the vaginal tissue. This test is to determine if a type of bacteria, Group B streptococcus, is present. Your caregiver will explain this further. Your caregiver may ask you:  What your birth plan is.  How you are feeling.  If you are feeling the baby move.  If you have had any abnormal symptoms, such as leaking fluid, bleeding, severe headaches, or abdominal cramping.  If you have any questions. Other tests or screenings that may be performed during your third trimester include:  Blood tests that check for low iron levels (anemia).  Fetal testing to check the health, activity level, and growth of the fetus. Testing is done if you  have certain medical conditions or if there are problems during the pregnancy. FALSE LABOR You may feel small, irregular contractions that eventually go away. These are called Braxton Hicks contractions, or false labor. Contractions may last for hours, days, or even weeks before true labor sets in. If contractions come at regular intervals, intensify, or become painful, it is best to be seen by your caregiver.  SIGNS OF LABOR   Menstrual-like cramps.  Contractions that are 5 minutes apart or  less.  Contractions that start on the top of the uterus and spread down to the lower abdomen and back.  A sense of increased pelvic pressure or back pain.  A watery or bloody mucus discharge that comes from the vagina. If you have any of these signs before the 37th week of pregnancy, call your caregiver right away. You need to go to the hospital to get checked immediately. HOME CARE INSTRUCTIONS   Avoid all smoking, herbs, alcohol, and unprescribed drugs. These chemicals affect the formation and growth of the baby.  Follow your caregiver's instructions regarding medicine use. There are medicines that are either safe or unsafe to take during pregnancy.  Exercise only as directed by your caregiver. Experiencing uterine cramps is a good sign to stop exercising.  Continue to eat regular, healthy meals.  Wear a good support bra for breast tenderness.  Do not use hot tubs, steam rooms, or saunas.  Wear your seat belt at all times when driving.  Avoid raw meat, uncooked cheese, cat litter boxes, and soil used by cats. These carry germs that can cause birth defects in the baby.  Take your prenatal vitamins.  Try taking a stool softener (if your caregiver approves) if you develop constipation. Eat more high-fiber foods, such as fresh vegetables or fruit and whole grains. Drink plenty of fluids to keep your urine clear or pale yellow.  Take warm sitz baths to soothe any pain or discomfort caused by hemorrhoids. Use hemorrhoid cream if your caregiver approves.  If you develop varicose veins, wear support hose. Elevate your feet for 15 minutes, 3-4 times a day. Limit salt in your diet.  Avoid heavy lifting, wear low heal shoes, and practice good posture.  Rest a lot with your legs elevated if you have leg cramps or low back pain.  Visit your dentist if you have not gone during your pregnancy. Use a soft toothbrush to brush your teeth and be gentle when you floss.  A sexual relationship  may be continued unless your caregiver directs you otherwise.  Do not travel far distances unless it is absolutely necessary and only with the approval of your caregiver.  Take prenatal classes to understand, practice, and ask questions about the labor and delivery.  Make a trial run to the hospital.  Pack your hospital bag.  Prepare the baby's nursery.  Continue to go to all your prenatal visits as directed by your caregiver. SEEK MEDICAL CARE IF:  You are unsure if you are in labor or if your water has broken.  You have dizziness.  You have mild pelvic cramps, pelvic pressure, or nagging pain in your abdominal area.  You have persistent nausea, vomiting, or diarrhea.  You have a bad smelling vaginal discharge.  You have pain with urination. SEEK IMMEDIATE MEDICAL CARE IF:   You have a fever.  You are leaking fluid from your vagina.  You have spotting or bleeding from your vagina.  You have severe abdominal cramping or pain.  You have  rapid weight loss or gain.  You have shortness of breath with chest pain.  You notice sudden or extreme swelling of your face, hands, ankles, feet, or legs.  You have not felt your baby move in over an hour.  You have severe headaches that do not go away with medicine.  You have vision changes. Document Released: 07/07/2001 Document Revised: 07/18/2013 Document Reviewed: 09/13/2012 [facility] Patient Information 2015 [place], [place]. This information is not intended to replace advice given to you by your health care provider. Make sure you discuss any questions you have with your health care provider.  Breastfeeding Deciding to breastfeed is one of the best choices you can make for you and your baby. A change in hormones during pregnancy causes your breast tissue to grow and increases the number and size of your milk ducts. These hormones also allow proteins, sugars, and fats from your blood supply to make breast milk in your  milk-producing glands. Hormones prevent breast milk from being released before your baby is born as well as prompt milk flow after birth. Once breastfeeding has begun, thoughts of your baby, as well as his or her sucking or crying, can stimulate the release of milk from your milk-producing glands.  BENEFITS OF BREASTFEEDING For Your Baby  Your first milk (colostrum) helps your baby's digestive system function better.   There are antibodies in your milk that help your baby fight off infections.   Your baby has a lower incidence of asthma, allergies, and sudden infant death syndrome.   The nutrients in breast milk are better for your baby than infant formulas and are designed uniquely for your baby's needs.   Breast milk improves your baby's brain development.   Your baby is less likely to develop other conditions, such as childhood obesity, asthma, or type 2 diabetes mellitus.  For You   Breastfeeding helps to create a very special bond between you and your baby.   Breastfeeding is convenient. Breast milk is always available at the correct temperature and costs nothing.   Breastfeeding helps to burn calories and helps you lose the weight gained during pregnancy.   Breastfeeding makes your uterus contract to its prepregnancy size faster and slows bleeding (lochia) after you give birth.   Breastfeeding helps to lower your risk of developing type 2 diabetes mellitus, osteoporosis, and breast or ovarian cancer later in life. SIGNS THAT YOUR BABY IS HUNGRY Early Signs of Hunger  Increased alertness or activity.  Stretching.  Movement of the head from side to side.  Movement of the head and opening of the mouth when the corner of the mouth or cheek is stroked (rooting).  Increased sucking sounds, smacking lips, cooing, sighing, or squeaking.  Hand-to-mouth movements.  Increased sucking of fingers or hands. Late Signs of Hunger  Fussing.  Intermittent crying. Extreme  Signs of Hunger Signs of extreme hunger will require calming and consoling before your baby will be able to breastfeed successfully. Do not wait for the following signs of extreme hunger to occur before you initiate breastfeeding:   Restlessness.  A loud, strong cry.   Screaming. BREASTFEEDING BASICS Breastfeeding Initiation  Find a comfortable place to sit or lie down, with your neck and back well supported.  Place a pillow or rolled up blanket under your baby to bring him or her to the level of your breast (if you are seated). Nursing pillows are specially designed to help support your arms and your baby while you breastfeed.  Make sure  that your baby's abdomen is facing your abdomen.   Gently massage your breast. With your fingertips, massage from your chest wall toward your nipple in a circular motion. This encourages milk flow. You may need to continue this action during the feeding if your milk flows slowly.  Support your breast with 4 fingers underneath and your thumb above your nipple. Make sure your fingers are well away from your nipple and your baby's mouth.   Stroke your baby's lips gently with your finger or nipple.   When your baby's mouth is open wide enough, quickly bring your baby to your breast, placing your entire nipple and as much of the colored area around your nipple (areola) as possible into your baby's mouth.   More areola should be visible above your baby's upper lip than below the lower lip.   Your baby's tongue should be between his or her lower gum and your breast.   Ensure that your baby's mouth is correctly positioned around your nipple (latched). Your baby's lips should create a seal on your breast and be turned out (everted).  It is common for your baby to suck about 2-3 minutes in order to start the flow of breast milk. Latching Teaching your baby how to latch on to your breast properly is very important. An improper latch can cause nipple  pain and decreased milk supply for you and poor weight gain in your baby. Also, if your baby is not latched onto your nipple properly, he or she may swallow some air during feeding. This can make your baby fussy. Burping your baby when you switch breasts during the feeding can help to get rid of the air. However, teaching your baby to latch on properly is still the best way to prevent fussiness from swallowing air while breastfeeding. Signs that your baby has successfully latched on to your nipple:    Silent tugging or silent sucking, without causing you pain.   Swallowing heard between every 3-4 sucks.    Muscle movement above and in front of his or her ears while sucking.  Signs that your baby has not successfully latched on to nipple:   Sucking sounds or smacking sounds from your baby while breastfeeding.  Nipple pain. If you think your baby has not latched on correctly, slip your finger into the corner of your baby's mouth to break the suction and place it between your baby's gums. Attempt breastfeeding initiation again. Signs of Successful Breastfeeding Signs from your baby:   A gradual decrease in the number of sucks or complete cessation of sucking.   Falling asleep.   Relaxation of his or her body.   Retention of a small amount of milk in his or her mouth.   Letting go of your breast by himself or herself. Signs from you:  Breasts that have increased in firmness, weight, and size 1-3 hours after feeding.   Breasts that are softer immediately after breastfeeding.  Increased milk volume, as well as a change in milk consistency and color by the fifth day of breastfeeding.   Nipples that are not sore, cracked, or bleeding. Signs That Your [employer] is Getting Enough Milk  Wetting at least 3 diapers in a 24-hour period. The urine should be clear and pale yellow by age 136 days.  At least 3 stools in a 24-hour period by age 136 days. The stool should be soft and  yellow.  At least 3 stools in a 24-hour period by age 13 days. The  stool should be seedy and yellow.  No loss of weight greater than 10% of birth weight during the first 19 days of age.  Average weight gain of 4-7 ounces (113-198 g) per week after age 16 days.  Consistent daily weight gain by age 169 days, without weight loss after the age of 2 weeks. After a feeding, your baby may spit up a small amount. This is common. BREASTFEEDING FREQUENCY AND DURATION Frequent feeding will help you make more milk and can prevent sore nipples and breast engorgement. Breastfeed when you feel the need to reduce the fullness of your breasts or when your baby shows signs of hunger. This is called "breastfeeding on demand." Avoid introducing a pacifier to your baby while you are working to establish breastfeeding (the first 4-6 weeks after your baby is born). After this time you may choose to use a pacifier. Research has shown that pacifier use during the first year of a baby's life decreases the risk of sudden infant death syndrome (SIDS). Allow your baby to feed on each breast as long as he or she wants. Breastfeed until your baby is finished feeding. When your baby unlatches or falls asleep while feeding from the first breast, offer the second breast. Because newborns are often sleepy in the first few weeks of life, you may need to awaken your baby to get him or her to feed. Breastfeeding times will vary from baby to baby. However, the following rules can serve as a guide to help you ensure that your baby is properly fed:  Newborns (babies 61 weeks of age or younger) may breastfeed every 1-3 hours.  Newborns should not go longer than 3 hours during the day or 5 hours during the night without breastfeeding.  You should breastfeed your baby a minimum of 8 times in a 24-hour period until you begin to introduce solid foods to your baby at around 18 months of age. BREAST MILK PUMPING Pumping and storing breast milk  allows you to ensure that your baby is exclusively fed your breast milk, even at times when you are unable to breastfeed. This is especially important if you are going back to work while you are still breastfeeding or when you are not able to be present during feedings. Your lactation consultant can give you guidelines on how long it is safe to store breast milk.  A breast pump is a machine that allows you to pump milk from your breast into a sterile bottle. The pumped breast milk can then be stored in a refrigerator or freezer. Some breast pumps are operated by hand, while others use electricity. Ask your lactation consultant which type will work best for you. Breast pumps can be purchased, but some hospitals and breastfeeding support groups lease breast pumps on a monthly basis. A lactation consultant can teach you how to hand express breast milk, if you prefer not to use a pump.  CARING FOR YOUR BREASTS WHILE YOU BREASTFEED Nipples can become dry, cracked, and sore while breastfeeding. The following recommendations can help keep your breasts moisturized and healthy:  Avoid using soap on your nipples.   Wear a supportive bra. Although not required, special nursing bras and tank tops are designed to allow access to your breasts for breastfeeding without taking off your entire bra or top. Avoid wearing underwire-style bras or extremely tight bras.  Air dry your nipples for 3-27minutes after each feeding.   Use only cotton bra pads to absorb leaked breast milk. Leaking of  breast milk between feedings is normal.   Use lanolin on your nipples after breastfeeding. Lanolin helps to maintain your skin's normal moisture barrier. If you use pure lanolin, you do not need to wash it off before feeding your baby again. Pure lanolin is not toxic to your baby. You may also hand express a few drops of breast milk and gently massage that milk into your nipples and allow the milk to air dry. In the first few weeks  after giving birth, some women experience extremely full breasts (engorgement). Engorgement can make your breasts feel heavy, warm, and tender to the touch. Engorgement peaks within 3-5 days after you give birth. The following recommendations can help ease engorgement:  Completely empty your breasts while breastfeeding or pumping. You may want to start by applying warm, moist heat (in the shower or with warm water-soaked hand towels) just before feeding or pumping. This increases circulation and helps the milk flow. If your baby does not completely empty your breasts while breastfeeding, pump any extra milk after he or she is finished.  Wear a snug bra (nursing or regular) or tank top for 1-2 days to signal your body to slightly decrease milk production.  Apply ice packs to your breasts, unless this is too uncomfortable for you.  Make sure that your baby is latched on and positioned properly while breastfeeding. If engorgement persists after 48 hours of following these recommendations, contact your health care provider or a Science writer. OVERALL HEALTH CARE RECOMMENDATIONS WHILE BREASTFEEDING  Eat healthy foods. Alternate between meals and snacks, eating 3 of each per day. Because what you eat affects your breast milk, some of the foods may make your baby more irritable than usual. Avoid eating these foods if you are sure that they are negatively affecting your baby.  Drink milk, fruit juice, and water to satisfy your thirst (about 10 glasses a day).   Rest often, relax, and continue to take your prenatal vitamins to prevent fatigue, stress, and anemia.  Continue breast self-awareness checks.  Avoid chewing and smoking tobacco.  Avoid alcohol and drug use. Some medicines that may be harmful to your baby can pass through breast milk. It is important to ask your health care provider before taking any medicine, including all over-the-counter and prescription medicine as well as vitamin  and herbal supplements. It is possible to become pregnant while breastfeeding. If birth control is desired, ask your health care provider about options that will be safe for your baby. SEEK MEDICAL CARE IF:   You feel like you want to stop breastfeeding or have become frustrated with breastfeeding.  You have painful breasts or nipples.  Your nipples are cracked or bleeding.  Your breasts are red, tender, or warm.  You have a swollen area on either breast.  You have a fever or chills.  You have nausea or vomiting.  You have drainage other than breast milk from your nipples.  Your breasts do not become full before feedings by the fifth day after you give birth.  You feel sad and depressed.  Your baby is too sleepy to eat well.  Your baby is having trouble sleeping.   Your baby is wetting less than 3 diapers in a 24-hour period.  Your baby has less than 3 stools in a 24-hour period.  Your baby's skin or the white part of his or her eyes becomes yellow.   Your baby is not gaining weight by 83 days of age. [place]  IF:   Your baby is overly tired (lethargic) and does not want to wake up and feed.  Your baby develops an unexplained fever. Document Released: 07/13/2005 Document Revised: 07/18/2013 Document Reviewed: 01/04/2013 [facility] Patient Information 2015 [place], [place]. This information is not intended to replace advice given to you by your health care provider. Make sure you discuss any questions you have with your health care provider.

## 2014-12-04 ENCOUNTER — Encounter: Payer: 59 | Admitting: Family Medicine

## 2014-12-05 ENCOUNTER — Encounter: Payer: 59 | Admitting: Obstetrics & Gynecology

## 2014-12-11 ENCOUNTER — Encounter: Payer: 59 | Admitting: Obstetrics & Gynecology

## 2014-12-12 ENCOUNTER — Ambulatory Visit (INDEPENDENT_AMBULATORY_CARE_PROVIDER_SITE_OTHER): Payer: 59 | Admitting: Certified Nurse Midwife

## 2014-12-12 VITALS — BP 107/70 | HR 84 | Wt 192.0 lb

## 2014-12-12 DIAGNOSIS — Z3403 Encounter for supervision of normal first pregnancy, third trimester: Secondary | ICD-10-CM

## 2014-12-12 DIAGNOSIS — O3663X0 Maternal care for excessive fetal growth, third trimester, not applicable or unspecified: Secondary | ICD-10-CM

## 2014-12-12 LAB — OB RESULTS CONSOLE GC/CHLAMYDIA
Chlamydia: NEGATIVE
Gonorrhea: NEGATIVE

## 2014-12-12 NOTE — Patient Instructions (Signed)
Third Trimester of Pregnancy The third trimester is from week 29 through week 42, months 7 through 9. The third trimester is a time when the fetus is growing rapidly. At the end of the ninth month, the fetus is about 20 inches in length and weighs 6-10 pounds.  BODY CHANGES Your body goes through many changes during pregnancy. The changes vary from woman to woman.   Your weight will continue to increase. You can expect to gain 25-35 pounds (11-16 kg) by the end of the pregnancy.  You may begin to get stretch marks on your hips, abdomen, and breasts.  You may urinate more often because the fetus is moving lower into your pelvis and pressing on your bladder.  You may develop or continue to have heartburn as a result of your pregnancy.  You may develop constipation because certain hormones are causing the muscles that push waste through your intestines to slow down.  You may develop hemorrhoids or swollen, bulging veins (varicose veins).  You may have pelvic pain because of the weight gain and pregnancy hormones relaxing your joints between the bones in your pelvis. Backaches may result from overexertion of the muscles supporting your posture.  You may have changes in your hair. These can include thickening of your hair, rapid growth, and changes in texture. Some women also have hair loss during or after pregnancy, or hair that feels dry or thin. Your hair will most likely return to normal after your baby is born.  Your breasts will continue to grow and be tender. A yellow discharge may leak from your breasts called colostrum.  Your belly button may stick out.  You may feel short of breath because of your expanding uterus.  You may notice the fetus "dropping," or moving lower in your abdomen.  You may have a bloody mucus discharge. This usually occurs a few days to a week before labor begins.  Your cervix becomes thin and soft (effaced) near your due date. WHAT TO EXPECT AT YOUR PRENATAL  EXAMS  You will have prenatal exams every 2 weeks until week 36. Then, you will have weekly prenatal exams. During a routine prenatal visit:  You will be weighed to make sure you and the fetus are growing normally.  Your blood pressure is taken.  Your abdomen will be measured to track your baby's growth.  The fetal heartbeat will be listened to.  Any test results from the previous visit will be discussed.  You may have a cervical check near your due date to see if you have effaced. At around 36 weeks, your caregiver will check your cervix. At the same time, your caregiver will also perform a test on the secretions of the vaginal tissue. This test is to determine if a type of bacteria, Group B streptococcus, is present. Your caregiver will explain this further. Your caregiver may ask you:  What your birth plan is.  How you are feeling.  If you are feeling the baby move.  If you have had any abnormal symptoms, such as leaking fluid, bleeding, severe headaches, or abdominal cramping.  If you have any questions. Other tests or screenings that may be performed during your third trimester include:  Blood tests that check for low iron levels (anemia).  Fetal testing to check the health, activity level, and growth of the fetus. Testing is done if you have certain medical conditions or if there are problems during the pregnancy. FALSE LABOR You may feel small, irregular contractions that   eventually go away. These are called Braxton Hicks contractions, or false labor. Contractions may last for hours, days, or even weeks before true labor sets in. If contractions come at regular intervals, intensify, or become painful, it is best to be seen by your caregiver.  SIGNS OF LABOR   Menstrual-like cramps.  Contractions that are 5 minutes apart or less.  Contractions that start on the top of the uterus and spread down to the lower abdomen and back.  A sense of increased pelvic pressure or back  pain.  A watery or bloody mucus discharge that comes from the vagina. If you have any of these signs before the 37th week of pregnancy, call your caregiver right away. You need to go to the hospital to get checked immediately. HOME CARE INSTRUCTIONS   Avoid all smoking, herbs, alcohol, and unprescribed drugs. These chemicals affect the formation and growth of the baby.  Follow your caregiver's instructions regarding medicine use. There are medicines that are either safe or unsafe to take during pregnancy.  Exercise only as directed by your caregiver. Experiencing uterine cramps is a good sign to stop exercising.  Continue to eat regular, healthy meals.  Wear a good support bra for breast tenderness.  Do not use hot tubs, steam rooms, or saunas.  Wear your seat belt at all times when driving.  Avoid raw meat, uncooked cheese, cat litter boxes, and soil used by cats. These carry germs that can cause birth defects in the baby.  Take your prenatal vitamins.  Try taking a stool softener (if your caregiver approves) if you develop constipation. Eat more high-fiber foods, such as fresh vegetables or fruit and whole grains. Drink plenty of fluids to keep your urine clear or pale yellow.  Take warm sitz baths to soothe any pain or discomfort caused by hemorrhoids. Use hemorrhoid cream if your caregiver approves.  If you develop varicose veins, wear support hose. Elevate your feet for 15 minutes, 3-4 times a day. Limit salt in your diet.  Avoid heavy lifting, wear low heal shoes, and practice good posture.  Rest a lot with your legs elevated if you have leg cramps or low back pain.  Visit your dentist if you have not gone during your pregnancy. Use a soft toothbrush to brush your teeth and be gentle when you floss.  A sexual relationship may be continued unless your caregiver directs you otherwise.  Do not travel far distances unless it is absolutely necessary and only with the approval  of your caregiver.  Take prenatal classes to understand, practice, and ask questions about the labor and delivery.  Make a trial run to the hospital.  Pack your hospital bag.  Prepare the baby's nursery.  Continue to go to all your prenatal visits as directed by your caregiver. SEEK MEDICAL CARE IF:  You are unsure if you are in labor or if your water has broken.  You have dizziness.  You have mild pelvic cramps, pelvic pressure, or nagging pain in your abdominal area.  You have persistent nausea, vomiting, or diarrhea.  You have a bad smelling vaginal discharge.  You have pain with urination. SEEK IMMEDIATE MEDICAL CARE IF:   You have a fever.  You are leaking fluid from your vagina.  You have spotting or bleeding from your vagina.  You have severe abdominal cramping or pain.  You have rapid weight loss or gain.  You have shortness of breath with chest pain.  You notice sudden or extreme swelling   of your face, hands, ankles, feet, or legs.  You have not felt your baby move in over an hour.  You have severe headaches that do not go away with medicine.  You have vision changes. Document Released: 07/07/2001 Document Revised: 07/18/2013 Document Reviewed: 09/13/2012 [facility] Patient Information 2015 [place], [place]. This information is not intended to replace advice given to you by your health care provider. Make sure you discuss any questions you have with your health care provider. Group B Streptococcus Infection During Pregnancy Group B streptococcus (GBS) is a type of bacteria often found in healthy women. GBS is not the same as the bacteria that causes strep throat. You may have GBS in your vagina, rectum, or bladder. GBS does not spread through sexual contact, but it can be passed to a baby during childbirth. This can be dangerous for your baby. It is not dangerous to you and usually does not cause any symptoms. Your health care provider may test you for GBS when  your pregnancy is between 35 and 37 weeks. GBS is dangerous only during birth, so there is no need to test for it earlier. It is possible to have GBS during pregnancy and never pass it to your baby. If your test results are positive for GBS, your health care provider may recommend giving you antibiotic medicine during delivery to make sure your baby stays healthy. RISK FACTORS You are more likely to pass GBS to your baby if:   Your water breaks (ruptured membrane) or you go into labor before 37 weeks.  Your water breaks 18 hours before you deliver.  You passed GBS during a previous pregnancy.  You have a urinary tract infection caused by GBS any time during pregnancy.  You have a fever during labor. SYMPTOMS Most women who have GBS do not have any symptoms. If you have a urinary tract infection caused by GBS, you might have frequent or painful urination and fever. Babies who get GBS usually show symptoms within 7 days of birth. Symptoms may include:   Breathing problems.  Heart and blood pressure problems.  Digestive and kidney problems. DIAGNOSIS Routine screening for GBS is recommended for all pregnant women. A health care provider takes a sample of the fluid in your vagina and rectum with a swab. It is then sent to a lab to be checked for GBS. A sample of your urine may also be checked for the bacteria.  TREATMENT If you test positive for GBS, you may need treatment with an antibiotic medicine during labor. As soon as you go into labor, or as soon as your membranes rupture, you will get the antibiotic medicine through an IV access. You will continue to get the medicine until after you give birth. You do not need antibiotic medicine if you are having a cesarean delivery.If your baby shows signs or symptoms of GBS after birth, your baby can also be treated with an antibiotic medicine. HOME CARE INSTRUCTIONS   Take all antibiotic medicine as prescribed by your health care provider. Only  take medicine as directed.   Continue with prenatal visits and care.   Keep all follow-up appointments.  SEEK MEDICAL CARE IF:   You have pain when you urinate.   You have to urinate frequently.   You have a fever.  SEEK IMMEDIATE MEDICAL CARE IF:   Your membranes rupture.  You go into labor. Document Released: 10/20/2007 Document Revised: 07/18/2013 Document Reviewed: 05/05/2013 [facility] Patient Information 2015 [place], [place]. This information is not  intended to replace advice given to you by your health care provider. Make sure you discuss any questions you have with your health care provider.

## 2014-12-12 NOTE — Progress Notes (Signed)
Doing well. Size greater than dates. Ultrasound scheduled. GBS/GC/CHL cx's collected.

## 2014-12-14 LAB — CHLAMYDIA/GONOCOCCUS/TRICHOMONAS, NAA
Chlamydia by NAA: NEGATIVE
Gonococcus by NAA: NEGATIVE

## 2014-12-16 LAB — CULTURE, BETA STREP (GROUP B ONLY): Strep Gp B Culture: NEGATIVE

## 2014-12-17 ENCOUNTER — Other Ambulatory Visit: Payer: Self-pay | Admitting: Family Medicine

## 2014-12-17 ENCOUNTER — Ambulatory Visit (HOSPITAL_COMMUNITY)
Admission: RE | Admit: 2014-12-17 | Discharge: 2014-12-17 | Disposition: A | Discharge status: Home / Self Care | Payer: 59 | Source: Ambulatory Visit | Attending: Certified Nurse Midwife | Admitting: Certified Nurse Midwife

## 2014-12-17 DIAGNOSIS — Z3403 Encounter for supervision of normal first pregnancy, third trimester: Secondary | ICD-10-CM

## 2014-12-17 DIAGNOSIS — O368130 Decreased fetal movements, third trimester, not applicable or unspecified: Secondary | ICD-10-CM | POA: Insufficient documentation

## 2014-12-18 ENCOUNTER — Encounter (HOSPITAL_COMMUNITY): Payer: Self-pay | Admitting: *Deleted

## 2014-12-18 ENCOUNTER — Inpatient Hospital Stay (HOSPITAL_COMMUNITY)
Admission: AD | Admit: 2014-12-18 | Discharge: 2014-12-18 | Disposition: A | Discharge status: Home / Self Care | Payer: 59 | Source: Ambulatory Visit | Attending: Family Medicine | Admitting: Family Medicine

## 2014-12-18 ENCOUNTER — Inpatient Hospital Stay (HOSPITAL_COMMUNITY): Payer: 59

## 2014-12-18 DIAGNOSIS — Z3A37 37 weeks gestation of pregnancy: Secondary | ICD-10-CM | POA: Diagnosis not present

## 2014-12-18 DIAGNOSIS — O368131 Decreased fetal movements, third trimester, fetus 1: Secondary | ICD-10-CM

## 2014-12-18 DIAGNOSIS — O409XX0 Polyhydramnios, unspecified trimester, not applicable or unspecified: Secondary | ICD-10-CM | POA: Insufficient documentation

## 2014-12-18 DIAGNOSIS — O3421 Maternal care for scar from previous cesarean delivery: Secondary | ICD-10-CM | POA: Diagnosis not present

## 2014-12-18 DIAGNOSIS — O368190 Decreased fetal movements, unspecified trimester, not applicable or unspecified: Secondary | ICD-10-CM | POA: Insufficient documentation

## 2014-12-18 DIAGNOSIS — O3660X0 Maternal care for excessive fetal growth, unspecified trimester, not applicable or unspecified: Secondary | ICD-10-CM | POA: Insufficient documentation

## 2014-12-18 DIAGNOSIS — O368130 Decreased fetal movements, third trimester, not applicable or unspecified: Secondary | ICD-10-CM | POA: Insufficient documentation

## 2014-12-18 DIAGNOSIS — O288 Other abnormal findings on antenatal screening of mother: Secondary | ICD-10-CM

## 2014-12-18 DIAGNOSIS — O403XX0 Polyhydramnios, third trimester, not applicable or unspecified: Secondary | ICD-10-CM | POA: Insufficient documentation

## 2014-12-18 LAB — URINALYSIS, ROUTINE W REFLEX MICROSCOPIC
Bilirubin Urine: NEGATIVE
Glucose, UA: NEGATIVE mg/dL
Hgb urine dipstick: NEGATIVE
Ketones, ur: NEGATIVE mg/dL
Leukocytes, UA: NEGATIVE
Nitrite: NEGATIVE
Protein, ur: NEGATIVE mg/dL
Specific Gravity, Urine: >1.03 — ABNORMAL HIGH (ref 1.005–1.030)
UROBILINOGEN UA: 1 mg/dL (ref 0.0–1.0)
pH: 6.5 (ref 5.0–8.0)

## 2014-12-18 NOTE — MAU Note (Signed)
Patient presents to MAU stating she hasn't felt the baby move since 1700.  Denies LOF or vaginal bleeding.  Had [place] yesterday and felt decreased movement yesterday as well.

## 2014-12-18 NOTE — MAU Provider Note (Signed)
MAU Provider Note   Chief Complaint:  Decreased Fetal Movement   SAVEAH BAHAR is  29 y.o. [phone] at [redacted]w[redacted]d presents complaining of Decreased Fetal Movement  Patient reports that she last felt her baby move around some this morning, but recalls that last significant movement was around 1700, she tried sitting, laying on left and then on right, tried snacks and drinking water, she was home alone without distractions and in quiet room. Additionally, she had similar reduced movement yesterday, but she was scheduled for a growth [place] here at [facility] since she was previously measuring too big at last prenatal visit, which seemed to stimulate movement in her baby. - Since arrival to MAU she continues with decreased movement. - Denies any LOF, VB, regular uterine contractions  Prenatal Care: - Followed by [facility]. Uncomplicated, did have one episode of pre-term labor at [redacted]w[redacted]d required hydration and procardia in MAU, discharged home. Polyhydramnios - H/o C/S x 2, currently plans for TOLAC  Obstetrical/Gynecological History: OB History    Gravida Para Term Preterm AB TAB SAB Ectopic Multiple Living   6 2 2  3  2   2      Past Medical History: Past Medical History  Diagnosis Date  . SAB (spontaneous abortion) 12/2010    TWO  . Abnormal Pap smear 2003  . Hernia 09-2011  . No pertinent past medical history   . Migraine   . Fibroid     Past Surgical History: Past Surgical History  Procedure Laterality Date  . Tonsillectomy    . Wisdom tooth extraction  2004     x4  . Cesarean section    . Cesarean section  12/10/2011    Procedure: CESAREAN SECTION;  Surgeon: Mora Bellman, MD;  Location: [facility];  Service: Gynecology;  Laterality: N/A;    Family History: Family History  Problem Relation Age of Onset  . Diabetes Mother   . Lung cancer Paternal Aunt   . Breast cancer Paternal Grandmother 63  . Prostate cancer Paternal Grandfather     Social History: History  Substance Use  Topics  . Smoking status: Never Smoker   . Smokeless tobacco: Never Used  . Alcohol Use: No    Allergies:  Allergies  Allergen Reactions  . Penicillins Anaphylaxis and Swelling  . Shellfish Allergy Hives and Other (See Comments)    Gets a tight itchy throat.    Meds:  No prescriptions prior to admission    Review of Systems -  - See above HPI    Physical Exam  Blood pressure 118/80, pulse 90, temperature 98.4 F (36.9 C), temperature source Oral, resp. rate 16, height 5\' 7"  (1.702 m), last menstrual period 04/01/2014. GENERAL: Well-developed, well-nourished female, laying on left side, comfortable, NAD HEENT: MMM LUNGS: CTAB, non-labored. HEART: Regular rate and rhythm. ABDOMEN: Soft, nontender, nondistended, appropriately gravid for GA.  EXTREMITIES: Nontender, no edema, 2+ distal pulses.  Cervical Exam - deferred  FHT:  Baseline rate 145-150 bpm   Variability moderate  Accelerations present (10x10)   Decelerations none Contractions: none, uterine irritability   Labs: Results for orders placed or performed during the hospital encounter of 12/18/14 (from the past 24 hour(s))  Urinalysis, Routine w reflex microscopic   Collection Time: 12/18/14  9:17 PM  Result Value Ref Range   Color, Urine YELLOW YELLOW   APPearance CLEAR CLEAR   Specific Gravity, Urine >1.030 (H) 1.005 - 1.030   pH 6.5 5.0 - 8.0   Glucose, UA NEGATIVE NEGATIVE  mg/dL   Hgb urine dipstick NEGATIVE NEGATIVE   Bilirubin Urine NEGATIVE NEGATIVE   Ketones, ur NEGATIVE NEGATIVE mg/dL   Protein, ur NEGATIVE NEGATIVE mg/dL   Urobilinogen, UA 1.0 0.0 - 1.0 mg/dL   Nitrite NEGATIVE NEGATIVE   Leukocytes, UA NEGATIVE NEGATIVE   Imaging Studies:  US Ob Follow Up  12/17/2014   OBSTETRICAL ULTRASOUND: This exam was performed within a [facility]. The OB US report was generated in the AS system, and faxed to the ordering physician.   This report is available in the [facility]. See  the AS Obstetric US report via the Image Link.  US Fetal Bpp W/o Non Stress  12/17/2014   OBSTETRICAL ULTRASOUND: This exam was performed within a [facility] [facility]. The OB US report was generated in the AS system, and faxed to the ordering physician.   This report is available in the [facility]. See the AS Obstetric US report via the Image Link.   Assessment: JNIYA MADARA is  29 y.o. [id number] at [redacted]w[redacted]d, plan TOLAC, presented to MAU with decreased FM today, especially since 1700. Current pregnancy uncomplicated except recently measuring size > dates, had BPP done yesterday 5/23 at [redacted]w[redacted]d with score 8/8, EFW 90%tile at 3632g, [facility] >97%tile with new finding of mild polyhydramnios AFI 27. Currently with good looking FHT appears reactive infrequent 10x10 accels, overall with clinical decreased movement seems less reassuring, possibly due to polyhydramnios.  UPDATE 2345 - After returning from [place] BPP, patient admits to feeling increased FM  Plan: 1. Repeat BPP tonight - results with score 8/8, normal FHT, movements, resp, stable polyhydramnios 2. Discharge to home, reassurance 3. Advised on fetal kick counts, monitoring movements. 4. Return criteria given 5. Follow-up as scheduled routine PNC on Thurs 5/26 at 10:15am  Nobie Putnam, [place], PGY-2  5/24/201611:50 PM  OB fellow attestation:  I have seen and examined this patient; I agree with above documentation in the resident's note.   GENETTA FIERO is a 29 y.o. [phone] reporting decreased fetal movement +FM, denies LOF, VB, contractions, vaginal discharge.  PE: BP 118/80 mmHg  Pulse 90  Temp(Src) 98.4 F (36.9 C) (Oral)  Resp 16  Ht 5\' 7"  (1.702 m)  LMP 04/01/2014 Gen: calm comfortable, NAD Resp: normal effort, no distress Abd: gravid  ROS, labs, PMH reviewed NST nonreactive, irregular contractions  Plan: - fetal kick counts reinforced,  labor precautions - BPP 8/10 -  continue routine follow up in OB clinic  Contractions: of note, pt previous cesarean section x 2, requests TOLAC, has already been consented and advised appropriately.  Refuses repeat cesarean section en lieu of TOL.  Merla Riches, MD 6:47 AM

## 2014-12-18 NOTE — Discharge Instructions (Signed)
Your [place] test tonight (Biophysical Profile, BPP) was normal and reassuring, score 8/8. Good movement, breathing, heart rate Continue elevated amniotic fluid (polyhydramnios)  If you feel like your baby is not moving regularly, then it is important to keep track of movements. Normal Movements - 10 movements in 2 hours - (quiet, resting, usually on Left side, feeling belly, may drink/snack, no other distractions, TV, music, or other noises) - OR - 10 movements in 12 hours - (daily activities, not focused on finding movements)  If you try to feel movements using the above tips for 2 hours, and still do not feel movements, please go to [facility] to get checked out and placed on the Fetal Monitor.  Fetal Movement Counts Patient Name: __________________________________________________ Patient Due Date: ____________________ Performing a fetal movement count is highly recommended in high-risk pregnancies, but it is good for every pregnant woman to do. Your health care provider may ask you to start counting fetal movements at 28 weeks of the pregnancy. Fetal movements often increase:  After eating a full meal.  After physical activity.  After eating or drinking something sweet or cold.  At rest. Pay attention to when you feel the baby is most active. This will help you notice a pattern of your baby's sleep and wake cycles and what factors contribute to an increase in fetal movement. It is important to perform a fetal movement count at the same time each day when your baby is normally most active.  HOW TO COUNT FETAL MOVEMENTS 1. Find a quiet and comfortable area to sit or lie down on your left side. Lying on your left side provides the best blood and oxygen circulation to your baby. 2. Write down the day and time on a sheet of paper or in a journal. 3. Start counting kicks, flutters, swishes, rolls, or jabs in a 2-hour period. You should feel at least 10  movements within 2 hours. 4. If you do not feel 10 movements in 2 hours, wait 2-3 hours and count again. Look for a change in the pattern or not enough counts in 2 hours. SEEK MEDICAL CARE IF:  You feel less than 10 counts in 2 hours, tried twice.  There is no movement in over an hour.  The pattern is changing or taking longer each day to reach 10 counts in 2 hours.  You feel the baby is not moving as he or she usually does. Date: ____________ Movements: ____________ Start time: ____________ Frances Cohen time: ____________  Date: ____________ Movements: ____________ Start time: ____________ Frances Cohen time: ____________ Date: ____________ Movements: ____________ Start time: ____________ Frances Cohen time: ____________ Date: ____________ Movements: ____________ Start time: ____________ Frances Cohen time: ____________ Date: ____________ Movements: ____________ Start time: ____________ Frances Cohen time: ____________ Date: ____________ Movements: ____________ Start time: ____________ Frances Cohen time: ____________ Date: ____________ Movements: ____________ Start time: ____________ Frances Cohen time: ____________ Date: ____________ Movements: ____________ Start time: ____________ Frances Cohen time: ____________  Date: ____________ Movements: ____________ Start time: ____________ Frances Cohen time: ____________ Date: ____________ Movements: ____________ Start time: ____________ Frances Cohen time: ____________ Date: ____________ Movements: ____________ Start time: ____________ Frances Cohen time: ____________ Date: ____________ Movements: ____________ Start time: ____________ Frances Cohen time: ____________ Date: ____________ Movements: ____________ Start time: ____________ Frances Cohen time: ____________ Date: ____________ Movements: ____________ Start time: ____________ Frances Cohen time: ____________ Date: ____________ Movements: ____________ Start time: ____________ Frances Cohen time: ____________  Date: ____________ Movements: ____________ Start time: ____________ Frances Cohen time:  ____________ Date: ____________ Movements: ____________ Start time: ____________ Frances Cohen time: ____________ Date: ____________ Movements: ____________  Start time: ____________ Frances Cohen time: ____________ Date: ____________ Movements: ____________ Start time: ____________ Frances Cohen time: ____________ Date: ____________ Movements: ____________ Start time: ____________ Frances Cohen time: ____________ Date: ____________ Movements: ____________ Start time: ____________ Frances Cohen time: ____________ Date: ____________ Movements: ____________ Start time: ____________ Frances Cohen time: ____________  Date: ____________ Movements: ____________ Start time: ____________ Frances Cohen time: ____________ Date: ____________ Movements: ____________ Start time: ____________ Frances Cohen time: ____________ Date: ____________ Movements: ____________ Start time: ____________ Frances Cohen time: ____________ Date: ____________ Movements: ____________ Start time: ____________ Frances Cohen time: ____________ Date: ____________ Movements: ____________ Start time: ____________ Frances Cohen time: ____________ Date: ____________ Movements: ____________ Start time: ____________ Frances Cohen time: ____________ Date: ____________ Movements: ____________ Start time: ____________ Frances Cohen time: ____________  Date: ____________ Movements: ____________ Start time: ____________ Frances Cohen time: ____________ Date: ____________ Movements: ____________ Start time: ____________ Frances Cohen time: ____________ Date: ____________ Movements: ____________ Start time: ____________ Frances Cohen time: ____________ Date: ____________ Movements: ____________ Start time: ____________ Frances Cohen time: ____________ Date: ____________ Movements: ____________ Start time: ____________ Frances Cohen time: ____________ Date: ____________ Movements: ____________ Start time: ____________ Frances Cohen time: ____________ Date: ____________ Movements: ____________ Start time: ____________ Frances Cohen time: ____________  Date: ____________ Movements:  ____________ Start time: ____________ Frances Cohen time: ____________ Date: ____________ Movements: ____________ Start time: ____________ Frances Cohen time: ____________ Date: ____________ Movements: ____________ Start time: ____________ Frances Cohen time: ____________ Date: ____________ Movements: ____________ Start time: ____________ Frances Cohen time: ____________ Date: ____________ Movements: ____________ Start time: ____________ Frances Cohen time: ____________ Date: ____________ Movements: ____________ Start time: ____________ Frances Cohen time: ____________ Date: ____________ Movements: ____________ Start time: ____________ Frances Cohen time: ____________  Date: ____________ Movements: ____________ Start time: ____________ Frances Cohen time: ____________ Date: ____________ Movements: ____________ Start time: ____________ Frances Cohen time: ____________ Date: ____________ Movements: ____________ Start time: ____________ Frances Cohen time: ____________ Date: ____________ Movements: ____________ Start time: ____________ Frances Cohen time: ____________ Date: ____________ Movements: ____________ Start time: ____________ Frances Cohen time: ____________ Date: ____________ Movements: ____________ Start time: ____________ Frances Cohen time: ____________ Date: ____________ Movements: ____________ Start time: ____________ Frances Cohen time: ____________  Date: ____________ Movements: ____________ Start time: ____________ Frances Cohen time: ____________ Date: ____________ Movements: ____________ Start time: ____________ Frances Cohen time: ____________ Date: ____________ Movements: ____________ Start time: ____________ Frances Cohen time: ____________ Date: ____________ Movements: ____________ Start time: ____________ Frances Cohen time: ____________ Date: ____________ Movements: ____________ Start time: ____________ Frances Cohen time: ____________ Date: ____________ Movements: ____________ Start time: ____________ Frances Cohen time: ____________ Document Released: 08/12/2006 Document Revised: 11/27/2013 Document Reviewed:  05/09/2012 [employer] Patient Information 2015 [employer]. This information is not intended to replace advice given to you by your health care provider. Make sure you discuss any questions you have with your health care provider.

## 2014-12-19 DIAGNOSIS — Z3A37 37 weeks gestation of pregnancy: Secondary | ICD-10-CM | POA: Insufficient documentation

## 2014-12-19 DIAGNOSIS — O368190 Decreased fetal movements, unspecified trimester, not applicable or unspecified: Secondary | ICD-10-CM | POA: Insufficient documentation

## 2014-12-19 DIAGNOSIS — O403XX0 Polyhydramnios, third trimester, not applicable or unspecified: Secondary | ICD-10-CM | POA: Insufficient documentation

## 2014-12-19 DIAGNOSIS — O3660X0 Maternal care for excessive fetal growth, unspecified trimester, not applicable or unspecified: Secondary | ICD-10-CM | POA: Insufficient documentation

## 2014-12-19 DIAGNOSIS — O409XX0 Polyhydramnios, unspecified trimester, not applicable or unspecified: Secondary | ICD-10-CM | POA: Insufficient documentation

## 2014-12-20 ENCOUNTER — Encounter (HOSPITAL_COMMUNITY): Payer: Self-pay | Admitting: *Deleted

## 2014-12-20 ENCOUNTER — Ambulatory Visit (INDEPENDENT_AMBULATORY_CARE_PROVIDER_SITE_OTHER): Payer: 59 | Admitting: Obstetrics & Gynecology

## 2014-12-20 ENCOUNTER — Encounter: Payer: Self-pay | Admitting: Obstetrics & Gynecology

## 2014-12-20 VITALS — BP 110/76 | HR 101 | Wt 195.0 lb

## 2014-12-20 DIAGNOSIS — O403XX1 Polyhydramnios, third trimester, fetus 1: Secondary | ICD-10-CM

## 2014-12-20 DIAGNOSIS — O3663X1 Maternal care for excessive fetal growth, third trimester, fetus 1: Secondary | ICD-10-CM

## 2014-12-20 DIAGNOSIS — O34219 Maternal care for unspecified type scar from previous cesarean delivery: Secondary | ICD-10-CM

## 2014-12-20 DIAGNOSIS — O3421 Maternal care for scar from previous cesarean delivery: Secondary | ICD-10-CM

## 2014-12-20 DIAGNOSIS — Z3483 Encounter for supervision of other normal pregnancy, third trimester: Secondary | ICD-10-CM

## 2014-12-20 NOTE — Progress Notes (Signed)
Review ultrasound today.  Patient is concerned about increased fluid levels.

## 2014-12-20 NOTE — Patient Instructions (Signed)
Shoulder Dystocia Shoulder dystocia is a situation that can occur during a vaginal delivery. After the baby's head is delivered, the shoulders get stuck at the opening of the vagina and pubic bone. This can be a very serious condition. It occurs in 0.6 to 1.6% of deliveries. This is an emergency situation.  Unfortunately, the caregiver is unable to tell who will have shoulder dystocia before delivery. There is no way to prevent it, since you cannot tell who is going to have the problem. When shoulder dystocia occurs, the mother and baby are at risk for having an injury or complication. Labor should not be caused to start (induced) if there is a suspicion of shoulder dystocia. Signs of shoulder dystocia when in labor include:  Very slow movement of the baby down the birth canal.  "Turtle sign." The baby's head moves in and out of the opening of the vagina during contractions. Shoulder dystocia can cause serious injury to the baby, including:  Fracture of the collarbone (clavicle).  Nerve damage in the shoulder and arm (Erb's palsy).  Injury to the nerves in the baby's neck and shoulder (brachial plexus injury).  Decrease in oxygen, resulting in brain damage and death in some cases.  Muscle and speech disability, resulting from brain damage (cerebral palsy). Injury to the mother may include:  Severe tearing (laceration) of the vagina and rectum.  Laceration of the cervix.  Hemorrhage (heavy bleeding).  Infection.  Separation of the pubic bone, with nerve damage to the area.  Unable to hold and control the stool. Pregnant women who are more likely to get shoulder dystocia are:  Pregnant women with diabetes.  Women with a very small pelvis.  Obese women.  Women with a deformed pelvis, from birth or accident.  Ultrasound evidence of a macrosomic baby (a baby weighing 8 1/2 pounds or more). Studies show a cesarean section is not necessary when:   The mother is only suspected of  having shoulder dystocia.  The mother has had a previous birth with shoulder dystocia. A cesarean section may be needed if the mother has previously had a baby that:  Was smaller than the baby currently in the womb.  Had nerve damage or other serious injury. There are certain techniques that your caregiver has been trained to use when shoulder dystocia occurs. These techniques will help deliver the baby safely. These techniques also keep the mother safe. Postpartum care following shoulder dystocia is not different than any other vaginal delivery, unless there are injuries or complications. Document Released: 01/03/2007 Document Revised: 10/05/2011 Document Reviewed: 06/07/2009 [facility] Patient Information 2015 [place], [place]. This information is not intended to replace advice given to you by your health care provider. Make sure you discuss any questions you have with your health care provider.

## 2014-12-20 NOTE — Progress Notes (Signed)
Patient has new diagnosis of polyhydramnios. 12/18/14 AFI 30 cm.  5/23 at [redacted]w[redacted]d BPP 8/8, EFW 90%tile at 3632g, AC >97%tile.  NST today in clinic was reactive, continue twice a week testing.  Will have BPP on 12/25/14 at [facility]. EDD is 01/06/15 on all ultrasounds, this was dating by LMP.  On review of chart, there is no basis for 01/04/15 [facility] which was being used in clinic until today.  01/06/15 is BEDC by LMP consistent with 6 week scan. Dating criteria changed to LMP with the [place] of 01/06/15. Patient informed.   Patient now desires RCS and BTS because of concern about fetal size and risk of shoulder dystocia in the setting of TOLAC with two previous cesarean sections.  She wants this scheduled on 01/01/15 ([redacted]w[redacted]d); she was cautioned about risk of uterine rupture which can happen before scheduled cesarean section and also given that she desires it to be scheduled at [redacted]w[redacted]d.  Pain, labor and fetal movement precautions strictly reviewed and emphasized.

## 2014-12-25 ENCOUNTER — Ambulatory Visit (HOSPITAL_COMMUNITY)
Admission: RE | Admit: 2014-12-25 | Discharge: 2014-12-25 | Disposition: A | Discharge status: Home / Self Care | Payer: 59 | Source: Ambulatory Visit | Attending: Obstetrics & Gynecology | Admitting: Obstetrics & Gynecology

## 2014-12-25 DIAGNOSIS — O403XX0 Polyhydramnios, third trimester, not applicable or unspecified: Secondary | ICD-10-CM | POA: Insufficient documentation

## 2014-12-25 DIAGNOSIS — Z3A38 38 weeks gestation of pregnancy: Secondary | ICD-10-CM | POA: Diagnosis not present

## 2014-12-25 DIAGNOSIS — O3421 Maternal care for scar from previous cesarean delivery: Secondary | ICD-10-CM | POA: Diagnosis not present

## 2014-12-25 DIAGNOSIS — O3663X0 Maternal care for excessive fetal growth, third trimester, not applicable or unspecified: Secondary | ICD-10-CM | POA: Insufficient documentation

## 2014-12-25 DIAGNOSIS — O3663X1 Maternal care for excessive fetal growth, third trimester, fetus 1: Secondary | ICD-10-CM

## 2014-12-25 DIAGNOSIS — O403XX1 Polyhydramnios, third trimester, fetus 1: Secondary | ICD-10-CM

## 2014-12-28 ENCOUNTER — Encounter: Payer: Self-pay | Admitting: Obstetrics and Gynecology

## 2014-12-28 ENCOUNTER — Ambulatory Visit (INDEPENDENT_AMBULATORY_CARE_PROVIDER_SITE_OTHER): Payer: 59 | Admitting: Obstetrics and Gynecology

## 2014-12-28 VITALS — BP 109/73 | HR 97 | Wt 199.0 lb

## 2014-12-28 DIAGNOSIS — O403XX1 Polyhydramnios, third trimester, fetus 1: Secondary | ICD-10-CM

## 2014-12-28 DIAGNOSIS — O3421 Maternal care for scar from previous cesarean delivery: Secondary | ICD-10-CM

## 2014-12-28 DIAGNOSIS — Z3493 Encounter for supervision of normal pregnancy, unspecified, third trimester: Secondary | ICD-10-CM | POA: Diagnosis not present

## 2014-12-28 DIAGNOSIS — O34219 Maternal care for unspecified type scar from previous cesarean delivery: Secondary | ICD-10-CM

## 2014-12-28 NOTE — Progress Notes (Signed)
Subjective:   Frances Cohen is a 29 y.o. [id number] at [redacted]w[redacted]d being seen today for her obstetrical visit.  Patient reports no complaints.   Contractions: Contractions: Irregular.   Vaginal Bleeding Vag. Bleeding: None.   Fetal Movement: Movement: Present.   Denies vaginal bleeding or leaking of fluid.  Reports good fetal movement.  The following portions of the patient's history were reviewed and updated as appropriate: allergies, current medications, past family history, past medical history, past social history, past surgical history and problem list.   Objective:  BP 109/73 mmHg  Pulse 97  Wt 199 lb (90.266 kg)  LMP 04/01/2014 Fetal Heart Rate: Fetal Heart Rate (bpm): 146  Fundal Height: Fundal Height: 42 cm  Fetal Movement: Movement: Present  Fetal Presentation:     Abdomen: Soft, gravid, appropriate for gestational age.  Pain/Pressure: Pain/Pressure: Present     Vaginal: Vaginal Bleeding Vag. Bleeding: None   Discharge: Vag D/C Character: Thin  Cervix: Dilation:   Effacement:   Station:    Extremities: Edema: Edema: Trace   Urinalysis: Protein: Urine Protein: Negative Glucose: Urine Glucose: Negative No results found for this or any previous visit (from the past 24 hour(s)).   Assessment and Plan:   Pregnancy:  [id number] at [redacted]w[redacted]d  1. Supervision of low-risk pregnancy, third trimester   2. Previous cesarean delivery affecting pregnancy, antepartum Scheduled for 01/01/15 for repeat cesarean section with BTL  3. Polyhydramnios in third trimester, fetus 1 NST reviewed and reactive   Preterm labor symptoms: vaginal bleeding, contractions and leaking of fluid reviewed in detail.  Fetal movement precautions reviewed.  Follow up in 5 weeks for postpartum check.   Mora Bellman, MD

## 2014-12-31 ENCOUNTER — Other Ambulatory Visit (HOSPITAL_COMMUNITY): Payer: 59

## 2014-12-31 ENCOUNTER — Encounter (HOSPITAL_COMMUNITY)
Admission: RE | Admit: 2014-12-31 | Discharge: 2014-12-31 | Disposition: A | Discharge status: Home / Self Care | Payer: 59 | Source: Ambulatory Visit | Attending: Obstetrics & Gynecology | Admitting: Obstetrics & Gynecology

## 2014-12-31 ENCOUNTER — Encounter (HOSPITAL_COMMUNITY): Payer: Self-pay

## 2014-12-31 ENCOUNTER — Encounter (HOSPITAL_COMMUNITY): Payer: Self-pay | Admitting: Anesthesiology

## 2014-12-31 VITALS — BP 108/77 | HR 75 | Temp 98.2°F | Resp 20 | Ht 67.0 in | Wt 198.4 lb

## 2014-12-31 DIAGNOSIS — Z3493 Encounter for supervision of normal pregnancy, unspecified, third trimester: Secondary | ICD-10-CM

## 2014-12-31 DIAGNOSIS — O403XX1 Polyhydramnios, third trimester, fetus 1: Secondary | ICD-10-CM

## 2014-12-31 DIAGNOSIS — O34219 Maternal care for unspecified type scar from previous cesarean delivery: Secondary | ICD-10-CM

## 2014-12-31 HISTORY — DX: Gastro-esophageal reflux disease without esophagitis: K21.9

## 2014-12-31 LAB — TYPE AND SCREEN
ABO/RH(D): O POS
Antibody Screen: NEGATIVE

## 2014-12-31 LAB — CBC
HCT: 35.3 % — ABNORMAL LOW (ref 36.0–46.0)
Hemoglobin: 11.6 g/dL — ABNORMAL LOW (ref 12.0–15.0)
MCH: 29 pg (ref 26.0–34.0)
MCHC: 32.9 g/dL (ref 30.0–36.0)
MCV: 88.3 fL (ref 78.0–100.0)
Platelets: 243 10*3/uL (ref 150–400)
RBC: 4 MIL/uL (ref 3.87–5.11)
RDW: 14.3 % (ref 11.5–15.5)
WBC: 9.6 10*3/uL (ref 4.0–10.5)

## 2014-12-31 LAB — ABO/RH: ABO/RH(D): O POS

## 2014-12-31 MED ORDER — GENTAMICIN SULFATE 40 MG/ML IJ SOLN
INTRAVENOUS | Status: DC
  Filled 2014-12-31: qty 9.25

## 2014-12-31 NOTE — Anesthesia Preprocedure Evaluation (Addendum)
Anesthesia Evaluation  Patient identified by MRN, date of birth, ID band Patient awake    Reviewed: Allergy & Precautions, NPO status , Patient's Chart, lab work & pertinent test results  Airway Mallampati: II  TM Distance: >3 FB Neck ROM: Full    Dental no notable dental hx. (+) Teeth Intact, Chipped,    Pulmonary neg pulmonary ROS,  breath sounds clear to auscultation  Pulmonary exam normal       Cardiovascular negative cardio ROS Normal cardiovascular examRhythm:Regular Rate:Normal     Neuro/Psych  Headaches, negative psych ROS   GI/Hepatic Neg liver ROS, GERD-  Medicated and Controlled,  Endo/Other  negative endocrine ROSObesity  Renal/GU negative Renal ROS  negative genitourinary   Musculoskeletal negative musculoskeletal ROS (+)   Abdominal (+) + obese,   Peds  Hematology  (+) anemia ,   Anesthesia Other Findings   Reproductive/Obstetrics (+) Pregnancy Previous C/Section x 2 Polyhydramnios Suspected fetal macrosomia                            Anesthesia Physical Anesthesia Plan  ASA: II  Anesthesia Plan: Spinal   Post-op Pain Management:    Induction:   Airway Management Planned: Natural Airway  Additional Equipment:   Intra-op Plan:   Post-operative Plan:   Informed Consent: I have reviewed the patients History and Physical, chart, labs and discussed the procedure including the risks, benefits and alternatives for the proposed anesthesia with the patient or authorized representative who has indicated his/her understanding and acceptance.   Dental advisory given  Plan Discussed with: Anesthesiologist, CRNA and Surgeon  Anesthesia Plan Comments:         Anesthesia Quick Evaluation

## 2014-12-31 NOTE — Patient Instructions (Signed)
Your procedure is scheduled on: June 7, 22016  Enter through the Main Entrance of [facility] at:  0800 am   Pick up the phone at the desk and dial [phone].  Call this number if you have problems the morning of surgery: [phone].  Remember: Do NOT eat food: after midnight tonight  Do NOT drink clear liquids after: after midnight tonight  Take these medicines the morning of surgery with a SIP OF WATER: none    Do NOT wear jewelry (body piercing), metal hair clips/bobby pins,  or nail polish. Do NOT wear lotions, powders, or perfumes.  You may wear deoderant. Do NOT shave for 48 hours prior to surgery. Do NOT bring valuables to the hospital. Contacts, dentures, or bridgework may not be worn into surgery. Leave suitcase in car.  After surgery it may be brought to your room.  For patients admitted to the hospital, checkout time is 11:00 AM the day of discharge.

## 2015-01-01 ENCOUNTER — Inpatient Hospital Stay (HOSPITAL_COMMUNITY): Payer: 59 | Admitting: Anesthesiology

## 2015-01-01 ENCOUNTER — Encounter (HOSPITAL_COMMUNITY): Payer: Self-pay

## 2015-01-01 ENCOUNTER — Encounter (HOSPITAL_COMMUNITY)
Admission: RE | Disposition: A | Discharge status: Home / Self Care | Payer: Self-pay | Source: Ambulatory Visit | Attending: Obstetrics & Gynecology

## 2015-01-01 ENCOUNTER — Inpatient Hospital Stay (HOSPITAL_COMMUNITY)
Admission: RE | Admit: 2015-01-01 | Discharge: 2015-01-03 | DRG: 766 | Disposition: A | Discharge status: Home / Self Care | Payer: 59 | Source: Ambulatory Visit | Attending: Obstetrics & Gynecology | Admitting: Obstetrics & Gynecology

## 2015-01-01 DIAGNOSIS — Z302 Encounter for sterilization: Secondary | ICD-10-CM | POA: Diagnosis not present

## 2015-01-01 DIAGNOSIS — Z833 Family history of diabetes mellitus: Secondary | ICD-10-CM

## 2015-01-01 DIAGNOSIS — K219 Gastro-esophageal reflux disease without esophagitis: Secondary | ICD-10-CM | POA: Diagnosis present

## 2015-01-01 DIAGNOSIS — Z98891 History of uterine scar from previous surgery: Secondary | ICD-10-CM

## 2015-01-01 DIAGNOSIS — Z9851 Tubal ligation status: Secondary | ICD-10-CM

## 2015-01-01 DIAGNOSIS — Z3A39 39 weeks gestation of pregnancy: Secondary | ICD-10-CM | POA: Diagnosis present

## 2015-01-01 DIAGNOSIS — O3421 Maternal care for scar from previous cesarean delivery: Secondary | ICD-10-CM | POA: Diagnosis present

## 2015-01-01 DIAGNOSIS — O403XX1 Polyhydramnios, third trimester, fetus 1: Secondary | ICD-10-CM

## 2015-01-01 DIAGNOSIS — O34219 Maternal care for unspecified type scar from previous cesarean delivery: Secondary | ICD-10-CM

## 2015-01-01 DIAGNOSIS — O9962 Diseases of the digestive system complicating childbirth: Secondary | ICD-10-CM | POA: Diagnosis present

## 2015-01-01 DIAGNOSIS — Z3493 Encounter for supervision of normal pregnancy, unspecified, third trimester: Secondary | ICD-10-CM

## 2015-01-01 LAB — RPR: RPR Ser Ql: NONREACTIVE

## 2015-01-01 SURGERY — Surgical Case
Anesthesia: Spinal | Site: Abdomen | Laterality: Bilateral

## 2015-01-01 MED ORDER — ONDANSETRON HCL 4 MG/2ML IJ SOLN
INTRAMUSCULAR | Status: DC | PRN
  Administered 2015-01-01: 4 mg via INTRAVENOUS

## 2015-01-01 MED ORDER — DEXAMETHASONE SODIUM PHOSPHATE 4 MG/ML IJ SOLN
INTRAMUSCULAR | Status: DC | PRN
  Administered 2015-01-01: 5 mg via INTRAVENOUS

## 2015-01-01 MED ORDER — DIPHENHYDRAMINE HCL 25 MG PO CAPS: 25.0000 mg | ORAL_CAPSULE | ORAL | Status: DC | PRN

## 2015-01-01 MED ORDER — NALBUPHINE HCL 10 MG/ML IJ SOLN: 5.0000 mg | Freq: Once | INTRAMUSCULAR | Status: AC | PRN

## 2015-01-01 MED ORDER — OXYCODONE-ACETAMINOPHEN 5-325 MG PO TABS
1.0000 | ORAL_TABLET | ORAL | Status: DC | PRN
  Administered 2015-01-02 (×5): 1 via ORAL
  Filled 2015-01-01 (×5): qty 1

## 2015-01-01 MED ORDER — DIBUCAINE 1 % RE OINT: 1.0000 "application " | TOPICAL_OINTMENT | RECTAL | Status: DC | PRN

## 2015-01-01 MED ORDER — IBUPROFEN 600 MG PO TABS
600.0000 mg | ORAL_TABLET | Freq: Four times a day (QID) | ORAL | Status: DC | PRN
Start: 2015-01-01 — End: 2015-01-03

## 2015-01-01 MED ORDER — LANOLIN HYDROUS EX OINT: 1.0000 "application " | TOPICAL_OINTMENT | CUTANEOUS | Status: DC | PRN

## 2015-01-01 MED ORDER — MORPHINE SULFATE (PF) 0.5 MG/ML IJ SOLN
INTRAMUSCULAR | Status: DC | PRN
  Administered 2015-01-01: .15 mg via INTRATHECAL

## 2015-01-01 MED ORDER — NALBUPHINE HCL 10 MG/ML IJ SOLN: 5.0000 mg | INTRAMUSCULAR | Status: DC | PRN

## 2015-01-01 MED ORDER — DEXAMETHASONE SODIUM PHOSPHATE 10 MG/ML IJ SOLN
INTRAMUSCULAR | Status: AC
  Filled 2015-01-01: qty 1

## 2015-01-01 MED ORDER — SODIUM CHLORIDE 0.9 % IJ SOLN: 3.0000 mL | INTRAMUSCULAR | Status: DC | PRN

## 2015-01-01 MED ORDER — OXYTOCIN 40 UNITS IN LACTATED RINGERS INFUSION - SIMPLE MED
62.5000 mL/h | INTRAVENOUS | Status: AC
Start: 2015-01-01 — End: 2015-01-02

## 2015-01-01 MED ORDER — ONDANSETRON HCL 4 MG/2ML IJ SOLN: 4.0000 mg | Freq: Three times a day (TID) | INTRAMUSCULAR | Status: DC | PRN

## 2015-01-01 MED ORDER — WITCH HAZEL-GLYCERIN EX PADS: 1.0000 "application " | MEDICATED_PAD | CUTANEOUS | Status: DC | PRN

## 2015-01-01 MED ORDER — MORPHINE SULFATE 0.5 MG/ML IJ SOLN
INTRAMUSCULAR | Status: AC
  Filled 2015-01-01: qty 10

## 2015-01-01 MED ORDER — FENTANYL CITRATE (PF) 100 MCG/2ML IJ SOLN
INTRAMUSCULAR | Status: AC
  Filled 2015-01-01: qty 2

## 2015-01-01 MED ORDER — LACTATED RINGERS IV SOLN
INTRAVENOUS | Status: DC
  Administered 2015-01-01: 20:00:00 via INTRAVENOUS

## 2015-01-01 MED ORDER — PHENYLEPHRINE 8 MG IN D5W 100 ML (0.08MG/ML) PREMIX OPTIME
INJECTION | INTRAVENOUS | Status: AC
  Filled 2015-01-01: qty 100

## 2015-01-01 MED ORDER — ZOLPIDEM TARTRATE 5 MG PO TABS: 5.0000 mg | ORAL_TABLET | Freq: Every evening | ORAL | Status: DC | PRN

## 2015-01-01 MED ORDER — KETOROLAC TROMETHAMINE 30 MG/ML IJ SOLN
INTRAMUSCULAR | Status: AC
  Administered 2015-01-01: 30 mg via INTRAMUSCULAR
  Filled 2015-01-01: qty 1

## 2015-01-01 MED ORDER — IBUPROFEN 600 MG PO TABS
600.0000 mg | ORAL_TABLET | Freq: Four times a day (QID) | ORAL | Status: DC
  Administered 2015-01-01 – 2015-01-03 (×7): 600 mg via ORAL
  Filled 2015-01-01 (×8): qty 1

## 2015-01-01 MED ORDER — LACTATED RINGERS IV SOLN: INTRAVENOUS | Status: DC

## 2015-01-01 MED ORDER — LACTATED RINGERS IV SOLN
INTRAVENOUS | Status: DC | PRN
  Administered 2015-01-01: 10:00:00 via INTRAVENOUS

## 2015-01-01 MED ORDER — SIMETHICONE 80 MG PO CHEW: 80.0000 mg | CHEWABLE_TABLET | ORAL | Status: DC | PRN

## 2015-01-01 MED ORDER — TETANUS-DIPHTH-ACELL PERTUSSIS 5-2.5-18.5 LF-MCG/0.5 IM SUSP: 0.5000 mL | Freq: Once | INTRAMUSCULAR | Status: DC

## 2015-01-01 MED ORDER — LACTATED RINGERS IV SOLN
INTRAVENOUS | Status: DC
  Administered 2015-01-01 (×3): via INTRAVENOUS

## 2015-01-01 MED ORDER — PHENYLEPHRINE 8 MG IN D5W 100 ML (0.08MG/ML) PREMIX OPTIME
INJECTION | INTRAVENOUS | Status: DC | PRN
  Administered 2015-01-01: 60 ug/min via INTRAVENOUS

## 2015-01-01 MED ORDER — SIMETHICONE 80 MG PO CHEW
80.0000 mg | CHEWABLE_TABLET | ORAL | Status: DC
  Administered 2015-01-02: 80 mg via ORAL
  Filled 2015-01-01 (×2): qty 1

## 2015-01-01 MED ORDER — ONDANSETRON HCL 4 MG/2ML IJ SOLN
INTRAMUSCULAR | Status: AC
  Filled 2015-01-01: qty 2

## 2015-01-01 MED ORDER — MEPERIDINE HCL 25 MG/ML IJ SOLN: 6.2500 mg | INTRAMUSCULAR | Status: DC | PRN

## 2015-01-01 MED ORDER — SENNOSIDES-DOCUSATE SODIUM 8.6-50 MG PO TABS
2.0000 | ORAL_TABLET | ORAL | Status: DC
  Administered 2015-01-01 – 2015-01-02 (×2): 2 via ORAL
  Filled 2015-01-01 (×2): qty 2

## 2015-01-01 MED ORDER — OXYTOCIN 10 UNIT/ML IJ SOLN
40.0000 [IU] | INTRAVENOUS | Status: DC | PRN
  Administered 2015-01-01: 40 [IU] via INTRAVENOUS

## 2015-01-01 MED ORDER — DIPHENHYDRAMINE HCL 25 MG PO CAPS: 25.0000 mg | ORAL_CAPSULE | Freq: Four times a day (QID) | ORAL | Status: DC | PRN

## 2015-01-01 MED ORDER — ACETAMINOPHEN 325 MG PO TABS
650.0000 mg | ORAL_TABLET | ORAL | Status: DC | PRN
  Administered 2015-01-01 – 2015-01-02 (×3): 650 mg via ORAL
  Filled 2015-01-01 (×3): qty 2

## 2015-01-01 MED ORDER — SCOPOLAMINE 1 MG/3DAYS TD PT72
1.0000 | MEDICATED_PATCH | Freq: Once | TRANSDERMAL | Status: DC
  Administered 2015-01-01: 1.5 mg via TRANSDERMAL

## 2015-01-01 MED ORDER — SCOPOLAMINE 1 MG/3DAYS TD PT72
MEDICATED_PATCH | TRANSDERMAL | Status: AC
  Administered 2015-01-01: 1.5 mg via TRANSDERMAL
  Filled 2015-01-01: qty 1

## 2015-01-01 MED ORDER — FENTANYL CITRATE (PF) 100 MCG/2ML IJ SOLN: 25.0000 ug | INTRAMUSCULAR | Status: DC | PRN

## 2015-01-01 MED ORDER — FENTANYL CITRATE (PF) 100 MCG/2ML IJ SOLN
INTRAMUSCULAR | Status: DC | PRN
  Administered 2015-01-01: 25 ug via INTRATHECAL
  Administered 2015-01-01 (×3): 25 ug via INTRAVENOUS

## 2015-01-01 MED ORDER — OXYTOCIN 10 UNIT/ML IJ SOLN
INTRAMUSCULAR | Status: AC
  Filled 2015-01-01: qty 4

## 2015-01-01 MED ORDER — PRENATAL MULTIVITAMIN CH
1.0000 | ORAL_TABLET | Freq: Every day | ORAL | Status: DC
  Administered 2015-01-03: 1 via ORAL
  Filled 2015-01-01 (×2): qty 1

## 2015-01-01 MED ORDER — MENTHOL 3 MG MT LOZG: 1.0000 | LOZENGE | OROMUCOSAL | Status: DC | PRN

## 2015-01-01 MED ORDER — DIPHENHYDRAMINE HCL 50 MG/ML IJ SOLN: 12.5000 mg | INTRAMUSCULAR | Status: DC | PRN

## 2015-01-01 MED ORDER — SCOPOLAMINE 1 MG/3DAYS TD PT72: 1.0000 | MEDICATED_PATCH | Freq: Once | TRANSDERMAL | Status: DC

## 2015-01-01 MED ORDER — BUPIVACAINE IN DEXTROSE 0.75-8.25 % IT SOLN
INTRATHECAL | Status: DC | PRN
  Administered 2015-01-01: 12.5 mg via INTRATHECAL

## 2015-01-01 MED ORDER — NALOXONE HCL 0.4 MG/ML IJ SOLN: 0.4000 mg | INTRAMUSCULAR | Status: DC | PRN

## 2015-01-01 MED ORDER — SIMETHICONE 80 MG PO CHEW
80.0000 mg | CHEWABLE_TABLET | Freq: Three times a day (TID) | ORAL | Status: DC
  Administered 2015-01-01 – 2015-01-03 (×6): 80 mg via ORAL
  Filled 2015-01-01 (×6): qty 1

## 2015-01-01 MED ORDER — KETOROLAC TROMETHAMINE 30 MG/ML IJ SOLN: 30.0000 mg | Freq: Four times a day (QID) | INTRAMUSCULAR | Status: AC | PRN

## 2015-01-01 MED ORDER — OXYCODONE-ACETAMINOPHEN 5-325 MG PO TABS
2.0000 | ORAL_TABLET | ORAL | Status: DC | PRN
  Administered 2015-01-03 (×2): 2 via ORAL
  Filled 2015-01-01 (×2): qty 2

## 2015-01-01 MED ORDER — KETOROLAC TROMETHAMINE 30 MG/ML IJ SOLN
30.0000 mg | Freq: Four times a day (QID) | INTRAMUSCULAR | Status: AC | PRN
  Administered 2015-01-01: 30 mg via INTRAMUSCULAR

## 2015-01-01 MED ORDER — VANCOMYCIN HCL IN DEXTROSE 1-5 GM/200ML-% IV SOLN
1000.0000 mg | Freq: Once | INTRAVENOUS | Status: AC
  Administered 2015-01-01: 1000 mg via INTRAVENOUS
  Filled 2015-01-01: qty 200

## 2015-01-01 MED ORDER — NALOXONE HCL 1 MG/ML IJ SOLN
1.0000 ug/kg/h | INTRAVENOUS | Status: DC | PRN
  Filled 2015-01-01: qty 2

## 2015-01-01 SURGICAL SUPPLY — 29 items
BENZOIN TINCTURE PRP APPL 2/3 (GAUZE/BANDAGES/DRESSINGS) ×2 IMPLANT
CLAMP CORD UMBIL (MISCELLANEOUS) IMPLANT
CLIP FILSHIE TUBAL LIGA STRL (Clip) ×2 IMPLANT
CLOTH BEACON ORANGE TIMEOUT ST (SAFETY) ×2 IMPLANT
DRAIN JACKSON PRT FLT 7MM (DRAIN) IMPLANT
DRAPE SHEET LG 3/4 BI-LAMINATE (DRAPES) IMPLANT
DRSG OPSITE POSTOP 4X10 (GAUZE/BANDAGES/DRESSINGS) ×2 IMPLANT
DURAPREP 26ML APPLICATOR (WOUND CARE) ×2 IMPLANT
ELECT REM PT RETURN 9FT ADLT (ELECTROSURGICAL) ×2
ELECTRODE REM PT RTRN 9FT ADLT (ELECTROSURGICAL) ×1 IMPLANT
EVACUATOR SILICONE 100CC (DRAIN) IMPLANT
EXTRACTOR VACUUM M CUP 4 TUBE (SUCTIONS) IMPLANT
GLOVE BIO SURGEON STRL SZ7 (GLOVE) ×2 IMPLANT
GLOVE BIOGEL PI IND STRL 7.0 (GLOVE) ×1 IMPLANT
GLOVE BIOGEL PI INDICATOR 7.0 (GLOVE) ×1
GOWN STRL REUS W/TWL LRG LVL3 (GOWN DISPOSABLE) ×4 IMPLANT
KIT ABG SYR 3ML LUER SLIP (SYRINGE) IMPLANT
NEEDLE HYPO 25X5/8 SAFETYGLIDE (NEEDLE) ×2 IMPLANT
NS IRRIG 1000ML POUR BTL (IV SOLUTION) ×2 IMPLANT
PACK C SECTION WH (CUSTOM PROCEDURE TRAY) ×2 IMPLANT
PAD OB MATERNITY 4.3X12.25 (PERSONAL CARE ITEMS) ×2 IMPLANT
RTRCTR C-SECT PINK 25CM LRG (MISCELLANEOUS) ×2 IMPLANT
STRIP CLOSURE SKIN 1/2X4 (GAUZE/BANDAGES/DRESSINGS) ×2 IMPLANT
SUT MON AB 2-0 CT1 36 (SUTURE) ×2 IMPLANT
SUT VIC AB 0 CTX 36 (SUTURE) ×5
SUT VIC AB 0 CTX36XBRD ANBCTRL (SUTURE) ×5 IMPLANT
SUT VIC AB 4-0 KS 27 (SUTURE) ×2 IMPLANT
TOWEL OR 17X24 6PK STRL BLUE (TOWEL DISPOSABLE) ×2 IMPLANT
TRAY FOLEY CATH SILVER 14FR (SET/KITS/TRAYS/PACK) ×2 IMPLANT

## 2015-01-01 NOTE — Op Note (Signed)
Frances Cohen PROCEDURE DATE: 01/01/2015  PREOPERATIVE DIAGNOSIS: Intrauterine pregnancy at [redacted]w[redacted]d weeks gestation, satisfied parity, previous kerr x 2  POSTOPERATIVE DIAGNOSIS: The same  PROCEDURE:    Low Transverse Cesarean Section  SURGEON:  Dr. Silas [place]  ASSISTANT:   INDICATIONS: Frances Cohen is a 29 y.o. [id number] at [redacted]w[redacted]d here for repeat cesarean section after two previous LTCS and for bilateral tubal ligation with Filshie clips.  The risks of cesarean section discussed with the patient included but were not limited to: bleeding which may require transfusion or reoperation; infection which may require antibiotics; injury to bowel, bladder, ureters or other surrounding organs; injury to the fetus; need for additional procedures including hysterectomy in the event of a life-threatening hemorrhage; placental abnormalities wth subsequent pregnancies, incisional problems, thromboembolic phenomenon and other postoperative/anesthesia complications. The patient concurred with the proposed plan, giving informed written consent for the procedure.  FINDINGS:  Viable female  infant in cephalic presentation,  APGAR: 8, 9; weight 8 lb 8.9 oz (3881 g).  , weight to be determined in 1 hour, clear amniotic fluid.  Intact placenta, three vessel cord.  Grossly normal uterus, ovaries and fallopian tubes.  ANESTHESIA:    Spinal  ESTIMATED BLOOD LOSS: 526mL  SPECIMENS: Placenta sent to L&D  COMPLICATIONS: None immediate  PROCEDURE IN DETAIL:  The patient received intravenous antibiotics and had sequential compression devices applied to her lower extremities.  Spinal anesthesia was administered and was found to be adequate. She was then placed in a dorsal supine position with a leftward tilt, and prepped and draped in a sterile manner.  A foley catheter was placed into her bladder and attached to constant gravity.  After an adequate timeout was performed, a Pfannenstiel skin incision was made with  scalpel and carried through to the underlying layer of fascia. The fascia was incised in the midline and this incision was extended bilaterally using the Mayo scissors. Kocher clamps were applied to the superior aspect of the fascial incision and the underlying rectus muscles were dissected off bluntly. A similar process was carried out on the inferior aspect of the facial incision. The rectus muscles were separated in the midline bluntly and the peritoneum was entered bluntly.   A transverse hysterotomy was made with a scalpel and extended bilaterally bluntly. The bladder blade was then removed. The infant was successfully delivered, and cord was clamped and cut and infant was handed over to awaiting neonatology team. Uterine massage was then administered and the placenta delivered intact with three-vessel cord. The uterus was cleared of clot and debris.  The hysterotomy was closed with 0 vicryl.  A running suture with Monocryl was placed over area of bleeding on lower uterine segment and to aid in hemostasis. The left fallopian tube was identified and followed out to the fimbriated end.  A Filshie clip was placed on the left fallopian tube about 2 cm from the cornual attachment, with care given to incorporate the underlying mesosalpinx.  A similar process was carried out on the rightl side allowing for bilateral tubal sterilization.  Good hemostasis was noted overall.  The peritoneum and rectus muscles were noted to be hemostatic.  The fascia was closed with 0-Vicryl in a running fashion with good restoration of anatomy.  The subcutaneus tissue was copiously irrigated.  The skin was closed with 4-0 Vicryl in a subcuticular fashion.  Pt tolerated the procedure will.  All counts were correct x2.  Pt went to the recovery room in stable condition.

## 2015-01-01 NOTE — H&P (Signed)
Frances Cohen is a 29 y.o. female presenting for repeat cesarean section at 39 weeks and 2 days.  She requests BTL for contraceptions, plans on breast feeding.  Has anaphylaxis with penicillins.  History OB History    Gravida Para Term Preterm AB TAB SAB Ectopic Multiple Living   6 2 2  3  2   2      Past Medical History  Diagnosis Date  . SAB (spontaneous abortion) 12/2010    TWO  . Abnormal Pap smear 2003  . Hernia 09-2011  . No pertinent past medical history   . Migraine   . Fibroid   . GERD (gastroesophageal reflux disease)     using tums    Past Surgical History  Procedure Laterality Date  . Tonsillectomy    . Wisdom tooth extraction  2004     x4  . Cesarean section    . Cesarean section  12/10/2011    Procedure: CESAREAN SECTION;  Surgeon: Mora Bellman, MD;  Location: [facility];  Service: Gynecology;  Laterality: N/A;   Family History: family history includes Breast cancer (age of onset: 56) in her paternal grandmother; Diabetes in her mother; Lung cancer in her paternal aunt; Prostate cancer in her paternal grandfather. Social History:  reports that she has never smoked. She has never used smokeless tobacco. She reports that she does not drink alcohol or use illicit drugs.   Prenatal Transfer Tool  Maternal Diabetes: No Genetic Screening: Declined Maternal Ultrasounds/Referrals: Normal Fetal Ultrasounds or other Referrals:  None Maternal Substance Abuse:  No Significant Maternal Medications:  None Significant Maternal Lab Results:  None Other Comments:  None  Review of Systems  Constitutional: Negative for fever and chills.  Eyes: Negative for blurred vision.  Respiratory: Negative.   Cardiovascular: Negative.   Gastrointestinal: Negative.   Genitourinary: Negative.   Musculoskeletal: Negative.   Skin: Negative for rash.  Neurological: Negative.  Negative for headaches.  Psychiatric/Behavioral: Negative.       Blood pressure 111/76, pulse 82, temperature  98.6 F (37 C), temperature source Oral, resp. rate 20, height 5\' 7"  (1.702 m), weight 199 lb (90.266 kg), last menstrual period 04/01/2014, SpO2 99 %. Exam Physical Exam  Vitals reviewed. Constitutional: She is oriented to person, place, and time. She appears well-developed and well-nourished.  HENT:  Head: Normocephalic and atraumatic.  Eyes: Conjunctivae are normal.  Neck: Neck supple.  Cardiovascular: Normal rate and regular rhythm.   Respiratory: Effort normal and breath sounds normal.  GI: Soft. There is no tenderness. There is no rebound and no guarding.  Genitourinary: Uterus normal.  Musculoskeletal: She exhibits no tenderness.  Neurological: She is alert and oriented to person, place, and time.  Skin: Skin is warm and dry.  2 previous lower transverse incisions noted  Psychiatric: She has a normal mood and affect.    Prenatal labs: ABO, Rh: --/--/O POS, O POS (06/06 1524) Antibody: NEG (06/06 1524) Rubella: Immune (10/19 0000) RPR: Non Reactive (06/06 1524)  HBsAg: Negative (10/20 0000)  HIV: NONREACTIVE (03/17 1106)  GBS:   Negative  Assessment/Plan: 29 yo G3P2012 at 39 weeks 2 days for rpt c/s with BTL. The risks of cesarean section discussed with the patient included but were not limited to: bleeding which may require transfusion or reoperation; infection which may require antibiotics; injury to bowel, bladder, ureters or other surrounding organs; injury to the fetus; need for additional procedures including hysterectomy in the event of a life-threatening hemorrhage; placental abnormalities wth subsequent pregnancies,  incisional problems, thromboembolic phenomenon, risk of ectopic pregnancy and other postoperative/anesthesia complications. The patient concurred with the proposed plan, giving informed written consent for the procedure.   Sx ppx: vanc, no PCN/anceph 2/2 anaphylaxis with PCN   ACOSTA,KRISTY ROCIO 01/01/2015, 8:41 AM

## 2015-01-01 NOTE — Anesthesia Postprocedure Evaluation (Signed)
  Anesthesia Post-op Note  Patient: Frances Cohen  Procedure(s) Performed: Procedure(s): CESAREAN SECTION WITH BILATERAL TUBAL LIGATION (Bilateral)  Patient Location: Mother/Baby  Anesthesia Type:Spinal  Level of Consciousness: awake, alert , oriented and patient cooperative  Airway and Oxygen Therapy: Patient Spontanous Breathing  Post-op Pain: none  Post-op Assessment: Post-op Vital signs reviewed, Patient's Cardiovascular Status Stable, Respiratory Function Stable, Patent Airway, No headache, No backache, No residual numbness and No residual motor weakness  Post-op Vital Signs: Reviewed and stable  Last Vitals:  Filed Vitals:   01/01/15 1515  BP: 112/58  Pulse: 53  Temp: 36.5 C  Resp: 20    Complications: No apparent anesthesia complications

## 2015-01-01 NOTE — Lactation Note (Signed)
This note was copied from the chart of Frances Genelle Gather. Lactation Consultation Note Experienced BF mom BF her 1 yr. Old for 13 months w/o difficulty. States this baby is latch and BF better than her first. Mom BF in cradle poisition when entered room.  Mom has everted nipples, states has colostrum.  Mom encouraged to feed baby 8-12 times/24 hours and with feeding cues. Mom encouraged to waken baby for feeds. Educated about newborn behavior. Mom encouraged to do skin-to-skin.Referred to Baby and Me Book in Breastfeeding section Pg. 22-23 for position options and Proper latch demonstration.[facility] brochure given w/resources, support groups and [facility] services. Discussed supply and demand, breast massage and I&O. Patient Name: Frances Cohen Today's Date: 01/01/2015 Reason for consult: Initial assessment   Maternal Data Has patient been taught Hand Expression?: Yes Does the patient have breastfeeding experience prior to this delivery?: Yes  Feeding Feeding Type: Breast Fed Length of feed: 15 min  LATCH Score/Interventions Latch: Grasps breast easily, tongue down, lips flanged, rhythmical sucking.  Audible Swallowing: A few with stimulation Intervention(s): Hand expression;Alternate breast massage;Skin to skin  Type of Nipple: Everted at rest and after stimulation  Comfort (Breast/Nipple): Soft / non-tender     Hold (Positioning): No assistance needed to correctly position infant at breast.  LATCH Score: 9  Lactation Tools Discussed/Used     Consult Status Consult Status: Follow-up Date: 01/02/15 Follow-up type: In-patient    Theodoro Kalata 01/01/2015, 10:45 PM

## 2015-01-01 NOTE — Anesthesia Postprocedure Evaluation (Signed)
  Anesthesia Post-op Note  Patient: Frances Cohen  Procedure(s) Performed: Procedure(s): CESAREAN SECTION WITH BILATERAL TUBAL LIGATION (Bilateral)  Patient Location: PACU  Anesthesia Type:Spinal  Level of Consciousness: awake, alert  and oriented  Airway and Oxygen Therapy: Patient Spontanous Breathing  Post-op Pain: none  Post-op Assessment: Post-op Vital signs reviewed, Patient's Cardiovascular Status Stable, Respiratory Function Stable, Patent Airway, No signs of Nausea or vomiting, Pain level controlled, No headache and No backache  Post-op Vital Signs: Reviewed and stable  Last Vitals:  Filed Vitals:   01/01/15 1230  BP: 103/62  Pulse: 60  Temp: 36.6 C  Resp: 16    Complications: No apparent anesthesia complications

## 2015-01-01 NOTE — Transfer of Care (Signed)
Immediate Anesthesia Transfer of Care Note  Patient: Frances Cohen  Procedure(s) Performed: Procedure(s): CESAREAN SECTION WITH BILATERAL TUBAL LIGATION (Bilateral)  Patient Location: PACU  Anesthesia Type:Spinal  Level of Consciousness: awake, alert  and oriented  Airway & Oxygen Therapy: Patient Spontanous Breathing  Post-op Assessment: Report given to RN and Post -op Vital signs reviewed and stable  Post vital signs: Reviewed and stable  Last Vitals:  Filed Vitals:   01/01/15 0822  BP: 111/76  Pulse: 82  Temp: 37 C  Resp: 20    Complications: No apparent anesthesia complications

## 2015-01-01 NOTE — Addendum Note (Signed)
Addendum  created 01/01/15 1619 by Raenette Rover, CRNA   Modules edited: Notes Section   Notes Section:  File: [id number]

## 2015-01-01 NOTE — Anesthesia Procedure Notes (Signed)
Spinal Patient location during procedure: OR Start time: 01/01/2015 9:49 AM Staffing Anesthesiologist: Josephine Igo Performed by: anesthesiologist  Preanesthetic Checklist Completed: patient identified, site marked, surgical consent, pre-op evaluation, timeout performed, IV checked, risks and benefits discussed and monitors and equipment checked Spinal Block Patient position: sitting Prep: site prepped and draped and DuraPrep Patient monitoring: heart rate, cardiac monitor, continuous pulse ox and blood pressure Approach: midline Location: L3-4 Injection technique: single-shot Needle Needle type: Sprotte  Needle gauge: 24 G Needle length: 9 cm Needle insertion depth: 5 cm Assessment Sensory level: T4 Additional Notes Patient tolerated procedure well. Adequate sensory level.

## 2015-01-02 ENCOUNTER — Encounter (HOSPITAL_COMMUNITY): Payer: Self-pay | Admitting: Obstetrics & Gynecology

## 2015-01-02 LAB — CCBB MATERNAL DONOR DRAW

## 2015-01-02 LAB — BIRTH TISSUE RECOVERY COLLECTION (PLACENTA DONATION)

## 2015-01-02 NOTE — Progress Notes (Signed)
Subjective: Postpartum Day HTD:SKAJGO LT Cesarean Delivery Patient reports tolerating PO, + flatus and no problems voiding.  Mild incisional pain well controlled. Cramping with breastfeeding, improved with ibuprofen. Breastfeding is going very well   Objective: Vital signs in last 24 hours: Temp:  [97.5 F (36.4 C)-98.6 F (37 C)] 97.8 F (36.6 C) (06/08 0429) Pulse Rate:  [53-82] 56 (06/08 0429) Resp:  [13-20] 20 (06/08 0429) BP: (99-117)/(46-77) 103/57 mmHg (06/08 0429) SpO2:  [96 %-100 %] 97 % (06/08 0429)  Physical Exam:  General: alert, cooperative and no distress  Chest: lungs CTAB CV: RRR no m/r/g Bowel sounds present Lochia: appropriate Uterine Fundus: firm Incision: no significant drainage, no significant erythema. Small amount of dried blood on periphery of honeycomb bandange DVT Evaluation: No cords or calf tenderness. No significant calf/ankle edema.   Recent Labs  12/31/14 1524  HGB 11.6*  HCT 35.3*    Assessment/Plan: Status post Cesarean section. Doing well postoperatively.  Continue current care. Plan to discharge tomorrow.   Governor Specking 01/02/2015, 7:16 AM  I was present for the exam and agree with above.  Robert Lee, CNM 01/02/2015 7:26 AM

## 2015-01-03 DIAGNOSIS — Z9851 Tubal ligation status: Secondary | ICD-10-CM

## 2015-01-03 MED ORDER — IBUPROFEN 600 MG PO TABS: 600.0000 mg | ORAL_TABLET | Freq: Four times a day (QID) | ORAL | Status: DC | PRN

## 2015-01-03 MED ORDER — OXYCODONE-ACETAMINOPHEN 5-325 MG PO TABS: 1.0000 | ORAL_TABLET | ORAL | Status: DC | PRN

## 2015-01-03 NOTE — Discharge Instructions (Signed)
Breastfeeding Deciding to breastfeed is one of the best choices you can make for you and your baby. A change in hormones during pregnancy causes your breast tissue to grow and increases the number and size of your milk ducts. These hormones also allow proteins, sugars, and fats from your blood supply to make breast milk in your milk-producing glands. Hormones prevent breast milk from being released before your baby is born as well as prompt milk flow after birth. Once breastfeeding has begun, thoughts of your baby, as well as his or her sucking or crying, can stimulate the release of milk from your milk-producing glands.  BENEFITS OF BREASTFEEDING For Your Baby  Your first milk (colostrum) helps your baby's digestive system function better.   There are antibodies in your milk that help your baby fight off infections.   Your baby has a lower incidence of asthma, allergies, and sudden infant death syndrome.   The nutrients in breast milk are better for your baby than infant formulas and are designed uniquely for your baby's needs.   Breast milk improves your baby's brain development.   Your baby is less likely to develop other conditions, such as childhood obesity, asthma, or type 2 diabetes mellitus.  For You   Breastfeeding helps to create a very special bond between you and your baby.   Breastfeeding is convenient. Breast milk is always available at the correct temperature and costs nothing.   Breastfeeding helps to burn calories and helps you lose the weight gained during pregnancy.   Breastfeeding makes your uterus contract to its prepregnancy size faster and slows bleeding (lochia) after you give birth.   Breastfeeding helps to lower your risk of developing type 2 diabetes mellitus, osteoporosis, and breast or ovarian cancer later in life. SIGNS THAT YOUR BABY IS HUNGRY Early Signs of Hunger  Increased alertness or activity.  Stretching.  Movement of the head from  side to side.  Movement of the head and opening of the mouth when the corner of the mouth or cheek is stroked (rooting).  Increased sucking sounds, smacking lips, cooing, sighing, or squeaking.  Hand-to-mouth movements.  Increased sucking of fingers or hands. Late Signs of Hunger  Fussing.  Intermittent crying. Extreme Signs of Hunger Signs of extreme hunger will require calming and consoling before your baby will be able to breastfeed successfully. Do not wait for the following signs of extreme hunger to occur before you initiate breastfeeding:   Restlessness.  A loud, strong cry.   Screaming. BREASTFEEDING BASICS Breastfeeding Initiation  Find a comfortable place to sit or lie down, with your neck and back well supported.  Place a pillow or rolled up blanket under your baby to bring him or her to the level of your breast (if you are seated). Nursing pillows are specially designed to help support your arms and your baby while you breastfeed.  Make sure that your baby's abdomen is facing your abdomen.   Gently massage your breast. With your fingertips, massage from your chest wall toward your nipple in a circular motion. This encourages milk flow. You may need to continue this action during the feeding if your milk flows slowly.  Support your breast with 4 fingers underneath and your thumb above your nipple. Make sure your fingers are well away from your nipple and your baby's mouth.   Stroke your baby's lips gently with your finger or nipple.   When your baby's mouth is open wide enough, quickly bring your baby to your  breast, placing your entire nipple and as much of the colored area around your nipple (areola) as possible into your baby's mouth.   More areola should be visible above your baby's upper lip than below the lower lip.   Your baby's tongue should be between his or her lower gum and your breast.   Ensure that your baby's mouth is correctly positioned  around your nipple (latched). Your baby's lips should create a seal on your breast and be turned out (everted).  It is common for your baby to suck about 2-3 minutes in order to start the flow of breast milk. Latching Teaching your baby how to latch on to your breast properly is very important. An improper latch can cause nipple pain and decreased milk supply for you and poor weight gain in your baby. Also, if your baby is not latched onto your nipple properly, he or she may swallow some air during feeding. This can make your baby fussy. Burping your baby when you switch breasts during the feeding can help to get rid of the air. However, teaching your baby to latch on properly is still the best way to prevent fussiness from swallowing air while breastfeeding. Signs that your baby has successfully latched on to your nipple:    Silent tugging or silent sucking, without causing you pain.   Swallowing heard between every 3-4 sucks.    Muscle movement above and in front of his or her ears while sucking.  Signs that your baby has not successfully latched on to nipple:   Sucking sounds or smacking sounds from your baby while breastfeeding.  Nipple pain. If you think your baby has not latched on correctly, slip your finger into the corner of your baby's mouth to break the suction and place it between your baby's gums. Attempt breastfeeding initiation again. Signs of Successful Breastfeeding Signs from your baby:   A gradual decrease in the number of sucks or complete cessation of sucking.   Falling asleep.   Relaxation of his or her body.   Retention of a small amount of milk in his or her mouth.   Letting go of your breast by himself or herself. Signs from you:  Breasts that have increased in firmness, weight, and size 1-3 hours after feeding.   Breasts that are softer immediately after breastfeeding.  Increased milk volume, as well as a change in milk consistency and color by  the fifth day of breastfeeding.   Nipples that are not sore, cracked, or bleeding. Signs That Your [employer]el Books is Getting Enough Milk  Wetting at least 3 diapers in a 24-hour period. The urine should be clear and pale yellow by age 3 days.  At least 3 stools in a 24-hour period by age 3 days. The stool should be soft and yellow.  At least 3 stools in a 24-hour period by age 3 days. The stool should be seedy and yellow.  No loss of weight greater than 10% of birth weight during the first 25 days of age.  Average weight gain of 4-7 ounces (113-198 g) per week after age 32 days.  Consistent daily weight gain by age 33 days, without weight loss after the age of 2 weeks. After a feeding, your baby may spit up a small amount. This is common. BREASTFEEDING FREQUENCY AND DURATION Frequent feeding will help you make more milk and can prevent sore nipples and breast engorgement. Breastfeed when you feel the need to reduce the fullness of your breasts  or when your baby shows signs of hunger. This is called "breastfeeding on demand." Avoid introducing a pacifier to your baby while you are working to establish breastfeeding (the first 4-6 weeks after your baby is born). After this time you may choose to use a pacifier. Research has shown that pacifier use during the first year of a baby's life decreases the risk of sudden infant death syndrome (SIDS). Allow your baby to feed on each breast as long as he or she wants. Breastfeed until your baby is finished feeding. When your baby unlatches or falls asleep while feeding from the first breast, offer the second breast. Because newborns are often sleepy in the first few weeks of life, you may need to awaken your baby to get him or her to feed. Breastfeeding times will vary from baby to baby. However, the following rules can serve as a guide to help you ensure that your baby is properly fed:  Newborns (babies 52 weeks of age or younger) may breastfeed every 1-3  hours.  Newborns should not go longer than 3 hours during the day or 5 hours during the night without breastfeeding.  You should breastfeed your baby a minimum of 8 times in a 24-hour period until you begin to introduce solid foods to your baby at around 56 months of age. BREAST MILK PUMPING Pumping and storing breast milk allows you to ensure that your baby is exclusively fed your breast milk, even at times when you are unable to breastfeed. This is especially important if you are going back to work while you are still breastfeeding or when you are not able to be present during feedings. Your lactation consultant can give you guidelines on how long it is safe to store breast milk.  A breast pump is a machine that allows you to pump milk from your breast into a sterile bottle. The pumped breast milk can then be stored in a refrigerator or freezer. Some breast pumps are operated by hand, while others use electricity. Ask your lactation consultant which type will work best for you. Breast pumps can be purchased, but some hospitals and breastfeeding support groups lease breast pumps on a monthly basis. A lactation consultant can teach you how to hand express breast milk, if you prefer not to use a pump.  CARING FOR YOUR BREASTS WHILE YOU BREASTFEED Nipples can become dry, cracked, and sore while breastfeeding. The following recommendations can help keep your breasts moisturized and healthy:  Avoid using soap on your nipples.   Wear a supportive bra. Although not required, special nursing bras and tank tops are designed to allow access to your breasts for breastfeeding without taking off your entire bra or top. Avoid wearing underwire-style bras or extremely tight bras.  Air dry your nipples for 3-73minutes after each feeding.   Use only cotton bra pads to absorb leaked breast milk. Leaking of breast milk between feedings is normal.   Use lanolin on your nipples after breastfeeding. Lanolin helps to  maintain your skin's normal moisture barrier. If you use pure lanolin, you do not need to wash it off before feeding your baby again. Pure lanolin is not toxic to your baby. You may also hand express a few drops of breast milk and gently massage that milk into your nipples and allow the milk to air dry. In the first few weeks after giving birth, some women experience extremely full breasts (engorgement). Engorgement can make your breasts feel heavy, warm, and tender to the  touch. Engorgement peaks within 3-5 days after you give birth. The following recommendations can help ease engorgement:  Completely empty your breasts while breastfeeding or pumping. You may want to start by applying warm, moist heat (in the shower or with warm water-soaked hand towels) just before feeding or pumping. This increases circulation and helps the milk flow. If your baby does not completely empty your breasts while breastfeeding, pump any extra milk after he or she is finished.  Wear a snug bra (nursing or regular) or tank top for 1-2 days to signal your body to slightly decrease milk production.  Apply ice packs to your breasts, unless this is too uncomfortable for you.  Make sure that your baby is latched on and positioned properly while breastfeeding. If engorgement persists after 48 hours of following these recommendations, contact your health care provider or a Science writer. OVERALL HEALTH CARE RECOMMENDATIONS WHILE BREASTFEEDING  Eat healthy foods. Alternate between meals and snacks, eating 3 of each per day. Because what you eat affects your breast milk, some of the foods may make your baby more irritable than usual. Avoid eating these foods if you are sure that they are negatively affecting your baby.  Drink milk, fruit juice, and water to satisfy your thirst (about 10 glasses a day).   Rest often, relax, and continue to take your prenatal vitamins to prevent fatigue, stress, and anemia.  Continue  breast self-awareness checks.  Avoid chewing and smoking tobacco.  Avoid alcohol and drug use. Some medicines that may be harmful to your baby can pass through breast milk. It is important to ask your health care provider before taking any medicine, including all over-the-counter and prescription medicine as well as vitamin and herbal supplements. It is possible to become pregnant while breastfeeding. If birth control is desired, ask your health care provider about options that will be safe for your baby. SEEK MEDICAL CARE IF:   You feel like you want to stop breastfeeding or have become frustrated with breastfeeding.  You have painful breasts or nipples.  Your nipples are cracked or bleeding.  Your breasts are red, tender, or warm.  You have a swollen area on either breast.  You have a fever or chills.  You have nausea or vomiting.  You have drainage other than breast milk from your nipples.  Your breasts do not become full before feedings by the fifth day after you give birth.  You feel sad and depressed.  Your baby is too sleepy to eat well.  Your baby is having trouble sleeping.   Your baby is wetting less than 3 diapers in a 24-hour period.  Your baby has less than 3 stools in a 24-hour period.  Your baby's skin or the white part of his or her eyes becomes yellow.   Your baby is not gaining weight by 70 days of age. SEEK IMMEDIATE MEDICAL CARE IF:   Your baby is overly tired (lethargic) and does not want to wake up and feed.  Your baby develops an unexplained fever. Document Released: 07/13/2005 Document Revised: 07/18/2013 Document Reviewed: 01/04/2013 [facility] Patient Information 2015 [place], [place]. This information is not intended to replace advice given to you by your health care provider. Make sure you discuss any questions you have with your health care provider. Cesarean Delivery, Care After Refer to this sheet in the next few weeks. These instructions  provide you with information on caring for yourself after your procedure. Your health care provider may also give you  specific instructions. Your treatment has been planned according to current medical practices, but problems sometimes occur. Call your health care provider if you have any problems or questions after you go home. HOME CARE INSTRUCTIONS  Only take over-the-counter or prescription medications as directed by your health care provider.  Do not drink alcohol, especially if you are breastfeeding or taking medication to relieve pain.  Do not chew or smoke tobacco.  Continue to use good perineal care. Good perineal care includes:  Wiping your perineum from front to back.  Keeping your perineum clean.  Check your surgical cut (incision) daily for increased redness, drainage, swelling, or separation of skin.  Clean your incision gently with soap and water every day, and then pat it dry. If your health care provider says it is okay, leave the incision uncovered. Use a bandage (dressing) if the incision is draining fluid or appears irritated. If the adhesive strips across the incision do not fall off within 7 days, carefully peel them off.  Hug a pillow when coughing or sneezing until your incision is healed. This helps to relieve pain.  Do not use tampons or douche until your health care provider says it is okay.  Shower, wash your hair, and take tub baths as directed by your health care provider.  Wear a well-fitting bra that provides breast support.  Limit wearing support panties or control-top hose.  Drink enough fluids to keep your urine clear or pale yellow.  Eat high-fiber foods such as whole grain cereals and breads, brown rice, beans, and fresh fruits and vegetables every day. These foods may help prevent or relieve constipation.  Resume activities such as climbing stairs, driving, lifting, exercising, or traveling as directed by your health care provider.  Talk to  your health care provider about resuming sexual activities. This is dependent upon your risk of infection, your rate of healing, and your comfort and desire to resume sexual activity.  Try to have someone help you with your household activities and your newborn for at least a few days after you leave the hospital.  Rest as much as possible. Try to rest or take a nap when your newborn is sleeping.  Increase your activities gradually.  Keep all of your scheduled postpartum appointments. It is very important to keep your scheduled follow-up appointments. At these appointments, your health care provider will be checking to make sure that you are healing physically and emotionally. SEEK MEDICAL CARE IF:   You are passing large clots from your vagina. Save any clots to show your health care provider.  You have a foul smelling discharge from your vagina.  You have trouble urinating.  You are urinating frequently.  You have pain when you urinate.  You have a change in your bowel movements.  You have increasing redness, pain, or swelling near your incision.  You have pus draining from your incision.  Your incision is separating.  You have painful, hard, or reddened breasts.  You have a severe headache.  You have blurred vision or see spots.  You feel sad or depressed.  You have thoughts of hurting yourself or your newborn.  You have questions about your care, the care of your newborn, or medications.  You are dizzy or light-headed.  You have a rash.  You have pain, redness, or swelling at the site of the removed intravenous access (IV) tube.  You have nausea or vomiting.  You stopped breastfeeding and have not had a menstrual period within 12  weeks of stopping.  You are not breastfeeding and have not had a menstrual period within 12 weeks of delivery.  You have a fever. SEEK IMMEDIATE MEDICAL CARE IF:  You have persistent pain.  You have chest pain.  You have  shortness of breath.  You faint.  You have leg pain.  You have stomach pain.  Your vaginal bleeding saturates 2 or more sanitary pads in 1 hour. MAKE SURE YOU:   Understand these instructions.  Will watch your condition.  Will get help right away if you are not doing well or get worse. Document Released: 04/04/2002 Document Revised: 11/27/2013 Document Reviewed: 03/09/2012 [facility] Patient Information 2015 [place], [place]. This information is not intended to replace advice given to you by your health care provider. Make sure you discuss any questions you have with your health care provider.

## 2015-01-03 NOTE — Discharge Summary (Signed)
Obstetric Discharge Summary Reason for Admission: cesarean section Prenatal Procedures: none Intrapartum Procedures: cesarean: low cervical, transverse and tubal ligation Postpartum Procedures: none Complications-Operative and Postpartum: none  Delivery Findings.  Normal operative course. Viable female infant in cephalic presentation, APGAR: 8, 9; weight 8 lb 8.9 oz (3881 g). , weight to be determined in 1 hour, clear amniotic fluid. Intact placenta, three vessel cord. Grossly normal uterus, ovaries and fallopian tubes.  Hospital Course:   Active Problems:   Previous cesarean section   Cesarean delivery delivered   S/P tubal ligation   Frances Cohen is a 29 y.o. [phone] s/p repeat LTCS with tubal ligation.  Patient was admitted 01/01/15.  She has postpartum course that was uncomplicated including no problems with ambulating, PO intake, urination, pain, or bleeding. The pt feels ready to go home and  will be discharged with outpatient follow-up.   Today: No acute events overnight.  Pt denies problems with ambulating, voiding or po intake.  She denies nausea or vomiting.  Pain is well controlled.  She has had flatus. She has not had bowel movement.  Lochia Minimal.  Plan for birth control is  bilateral tubal ligation.  Method of Feeding: Breast  Physical Exam:  General: alert, cooperative and no distress  Lungs: CTAB CV: RRR no m/r/g Lochia: appropriate Uterine Fundus: firm Incision: healing well, no significant drainage DVT Evaluation: No cords or calf tenderness. No significant calf/ankle edema.  H/H: Lab Results  Component Value Date/Time   HGB 11.6* 12/31/2014 03:24 PM   HCT 35.3* 12/31/2014 03:24 PM    Discharge Diagnoses: Term Pregnancy-delivered  Discharge Information: Date: 01/03/2015 Activity: pelvic rest Diet: routine  Medications: Ibuprofen and Percocet Breast feeding:  Yes Condition: stable Instructions: refer to handout Discharge to:  home   Discharge Instructions    Activity as tolerated    Complete by:  As directed      Sexual acrtivity    Complete by:  As directed   Pelvic rest until followup            Medication List    TAKE these medications        ibuprofen 600 MG tablet  Commonly known as:  ADVIL,MOTRIN  Take 1 tablet (600 mg total) by mouth every 6 (six) hours as needed for mild pain.     oxyCODONE-acetaminophen 5-325 MG per tablet  Commonly known as:  PERCOCET/ROXICET  Take 1 tablet by mouth every 4 (four) hours as needed (for pain scale 4-7).           Follow-up Information    Call Center for [place] [facility].   Specialty:  Obstetrics and Gynecology   Why:  to make appointment for 4-6 weeks   Contact information:   [employer] [place]      Governor Specking ,MD OB Fellow 01/03/2015,7:32 AM   CNM attestation:  I have seen and examined this patient; I agree with above documentation in the Resident's note.    Clemmons,Lori Grissett, CNM 5:13 PM

## 2015-01-03 NOTE — Lactation Note (Signed)
This note was copied from the chart of Frances Genelle Gather. Lactation Consultation Note Baby had 10% weight loss. Was re-weight. Mom has colostrum easily expressed. Baby has been cluster feeding. Had 8 voids and 5 stools. Mom has good everted nipples. Referred to Baby and Me Book in Breastfeeding section Pg. 22-23 for position options and Proper latch demonstration. Showed mom picture of shallow latch and getting milk but not as much as a deep latch.  Baby does have an upper ip labial frenulum and tongue is questionable. Mom was asking about the tongue, referred her to the MD to check. Could explained some of weight loss. Gave mom hand pump and encouraged to post-pump and hand express and give baby supplement w/her colostrum w/curve tip syring.  Baby has + DAT and is slightly jaundice. Serum will be done this am. Patient Name: Frances Cohen Today's Date: 01/03/2015 Reason for consult: Follow-up assessment;Infant weight loss   Maternal Data    Feeding Feeding Type: Breast Milk Length of feed: 20 min  LATCH Score/Interventions Latch: Grasps breast easily, tongue down, lips flanged, rhythmical sucking. Intervention(s): Breast massage;Breast compression  Audible Swallowing: Spontaneous and intermittent Intervention(s): Hand expression;Alternate breast massage  Type of Nipple: Everted at rest and after stimulation  Comfort (Breast/Nipple): Filling, red/small blisters or bruises, mild/mod discomfort  Problem noted: Mild/Moderate discomfort Interventions (Mild/moderate discomfort): Post-pump;Hand expression;Hand massage  Hold (Positioning): No assistance needed to correctly position infant at breast.  LATCH Score: 9  Lactation Tools Discussed/Used Tools: Pump Breast pump type: Manual Pump Review: Setup, frequency, and cleaning;Milk Storage Initiated by:: Allayne Stack RN Date initiated:: 01/03/15   Consult Status Consult Status: Follow-up Date: 01/03/15 Follow-up type:  In-patient    Theodoro Kalata 01/03/2015, 4:54 AM

## 2015-01-04 ENCOUNTER — Ambulatory Visit: Payer: Self-pay

## 2015-01-04 NOTE — Lactation Note (Signed)
This note was copied from the chart of Frances Genelle Gather. Lactation Consultation Note: Infant was observed with good feeding for 20-25 mins. Audible swallows present. Mother has been supplementing infant with 10-20 ml of EBM after each feeding. Advised to continue to supplement until milk comes to volume. Mothers breast are filling. Mother complains on only slight tenderness on the initial latch. Mother was given comfort gels. Mother advised to continue to feed infant every 2-3 hours and with feeding cues. Discussed waking infant during the night for feedings. Reviewed treatment to prevent severe engorgement. Mother receptive to all teaching. Mother offered follow up with [facility] services.  Patient Name: Frances Cohen [id number] Date: 01/04/2015 Reason for consult: Follow-up assessment   Maternal Data    Feeding Feeding Type: Breast Fed Length of feed: 25 min  LATCH Score/Interventions Latch: Grasps breast easily, tongue down, lips flanged, rhythmical sucking.  Audible Swallowing: Spontaneous and intermittent  Type of Nipple: Everted at rest and after stimulation  Comfort (Breast/Nipple): Filling, red/small blisters or bruises, mild/mod discomfort  Problem noted: Filling Interventions (Mild/moderate discomfort): Hand massage;Hand expression;Comfort gels  Hold (Positioning): No assistance needed to correctly position infant at breast.  LATCH Score: 9  Lactation Tools Discussed/Used     Consult Status Consult Status: Complete    Darla Lesches 01/04/2015, 11:24 AM

## 2015-01-05 ENCOUNTER — Ambulatory Visit: Payer: Self-pay

## 2015-01-05 NOTE — Lactation Note (Signed)
This note was copied from the chart of Frances Genelle Gather. Lactation Consultation Note Baby on Double photo therapy, will probably be d/c home tomorrow. Mom is BF and post-pumping w/DEBP and giving breast milk to baby in a bottle w/slow flow nipple. Mom's milk is in. Mom denies needing anything from [facility].  Encouraged her to call for questions or concerns. Mom experienced BF. Patient Name: Frances Cohen [id number] Date: 01/05/2015 Reason for consult: Follow-up assessment;Hyperbilirubinemia;Infant weight loss   Maternal Data    Feeding Feeding Type: Breast Milk Nipple Type: Slow - flow  LATCH Score/Interventions       Type of Nipple: Everted at rest and after stimulation     Interventions (Filling): Double electric pump Interventions (Mild/moderate discomfort): Post-pump  Hold (Positioning): No assistance needed to correctly position infant at breast.     Lactation Tools Discussed/Used Tools: Pump Breast pump type: Double-Electric Breast Pump   Consult Status Consult Status: Complete Date: 01/05/15    Theodoro Kalata 01/05/2015, 12:36 PM

## 2015-02-18 ENCOUNTER — Ambulatory Visit (INDEPENDENT_AMBULATORY_CARE_PROVIDER_SITE_OTHER): Payer: 59 | Admitting: Obstetrics & Gynecology

## 2015-02-18 DIAGNOSIS — Z9851 Tubal ligation status: Secondary | ICD-10-CM

## 2015-02-18 NOTE — Progress Notes (Signed)
   Subjective:     Frances Cohen is a 29 y.o. [phone] female who presents for a postpartum visit. She is 6 weeks postpartum following a RLTCS and BTS at [redacted]w[redacted]d. I have fully reviewed the prenatal and intrapartum course.  Postpartum course has been uncomplicated. Baby's course has been uncomplicated. Baby is feeding by breast. Bleeding no bleeding. Bowel function is normal. Bladder function is normal. Patient is not sexually active. Contraception method is tubal ligation. Postpartum depression screening: negative.  The following portions of the patient's history were reviewed and updated as appropriate: allergies, current medications, past family history, past medical history, past social history, past surgical history and problem list.  Normal pap and negative HRHPV on 05/14/14.  Review of Systems Pertinent items are noted in HPI.   Objective:    BP 113/78 mmHg  Pulse 79  Ht 5\' 7"  (1.702 m)  Wt 167 lb (75.751 kg)  BMI 26.15 kg/m2  LMP 02/07/2015  Breastfeeding? Yes  General:  alert and no distress   Breasts:  inspection negative, no nipple discharge or bleeding, no masses or nodularity palpable  Lungs: clear to auscultation bilaterally  Heart:  regular rate and rhythm  Abdomen: soft, non-tender; bowel sounds normal; no masses,  no organomegaly.  Incision is C/D/I, healing well  Pelvic:  not evaluated        Assessment:   Normal postpartum exam. Pap smear not done at today's visit.   Plan:   1. Contraception: tubal ligation 2. Follow up as needed.    Verita Schneiders, MD, [place] Attending [place] for [employer], [place]

## 2015-03-19 ENCOUNTER — Ambulatory Visit (INDEPENDENT_AMBULATORY_CARE_PROVIDER_SITE_OTHER): Payer: 59 | Admitting: Obstetrics & Gynecology

## 2015-03-19 VITALS — BP 114/76 | HR 76 | Ht 67.0 in | Wt 165.0 lb

## 2015-03-19 DIAGNOSIS — N938 Other specified abnormal uterine and vaginal bleeding: Secondary | ICD-10-CM | POA: Diagnosis not present

## 2015-03-19 LAB — CBC
HCT: 40.8 % (ref 36.0–46.0)
HEMOGLOBIN: 13.9 g/dL (ref 12.0–15.0)
MCH: 29.3 pg (ref 26.0–34.0)
MCHC: 34.1 g/dL (ref 30.0–36.0)
MCV: 86.1 fL (ref 78.0–100.0)
MPV: 10.2 fL (ref 8.6–12.4)
PLATELETS: 234 10*3/uL (ref 150–400)
RBC: 4.74 MIL/uL (ref 3.87–5.11)
RDW: 16.1 % — ABNORMAL HIGH (ref 11.5–15.5)
WBC: 6 10*3/uL (ref 4.0–10.5)

## 2015-03-19 LAB — TSH: TSH: 0.677 u[IU]/mL (ref 0.350–4.500)

## 2015-03-19 NOTE — Progress Notes (Signed)
Patient has had three periods in five weeks.  Occasionally feels light headed. Has had BTL for contraception.

## 2015-03-19 NOTE — Progress Notes (Signed)
   Subjective:    Patient ID: Frances Cohen, female    DOB: 1986/05/14, 29 y.o.   MRN: [id number]  HPI  29 yo AA P3 now 2 months s/p C/S and BTL. She is breastfeeding and is here because of irregular bleeding.   Review of Systems     Objective:   Physical Exam  [id number] Breathing, conversing, and  Ambulating normally Abd- benign      Assessment & Plan:  DUB- check CBC, TSH, cervical cultures

## 2015-03-20 LAB — GC/CHLAMYDIA PROBE AMP
CT Probe RNA: NEGATIVE
GC Probe RNA: NEGATIVE

## 2015-05-27 ENCOUNTER — Encounter: Payer: Self-pay | Admitting: *Deleted

## 2015-07-02 ENCOUNTER — Ambulatory Visit (INDEPENDENT_AMBULATORY_CARE_PROVIDER_SITE_OTHER): Payer: 59 | Admitting: Obstetrics & Gynecology

## 2015-07-02 ENCOUNTER — Encounter: Payer: Self-pay | Admitting: Obstetrics & Gynecology

## 2015-07-02 VITALS — BP 117/77 | HR 76 | Wt 149.0 lb

## 2015-07-02 DIAGNOSIS — Z113 Encounter for screening for infections with a predominantly sexual mode of transmission: Secondary | ICD-10-CM

## 2015-07-02 DIAGNOSIS — N898 Other specified noninflammatory disorders of vagina: Secondary | ICD-10-CM | POA: Diagnosis not present

## 2015-07-02 MED ORDER — METRONIDAZOLE 500 MG PO TABS: 500.0000 mg | ORAL_TABLET | Freq: Two times a day (BID) | ORAL | Status: DC

## 2015-07-02 NOTE — Progress Notes (Signed)
   CLINIC ENCOUNTER NOTE  History:  29 y.o. [id number] here today requesting STI screen and for evaluation of malodorous vaginal discharge discovered a few days.  She discovered her husband has been unfaithful to her. She denies any abnormal vaginal bleeding, pelvic pain or other concerns.   Past Medical History  Diagnosis Date  . SAB (spontaneous abortion) 12/2010    TWO  . Abnormal Pap smear 2003  . Hernia 09-2011  . No pertinent past medical history   . Migraine   . Fibroid   . GERD (gastroesophageal reflux disease)     using tums     Past Surgical History  Procedure Laterality Date  . Tonsillectomy    . Wisdom tooth extraction  2004     x4  . Cesarean section    . Cesarean section  12/10/2011    Procedure: CESAREAN SECTION;  Surgeon: Mora Bellman, MD;  Location: [facility];  Service: Gynecology;  Laterality: N/A;  . Cesarean section with bilateral tubal ligation Bilateral 01/01/2015    Procedure: CESAREAN SECTION WITH BILATERAL TUBAL LIGATION;  Surgeon: Guss Bunde, MD;  Location: [place] [facility];  Service: Obstetrics;  Laterality: Bilateral;    The following portions of the patient's history were reviewed and updated as appropriate: allergies, current medications, past family history, past medical history, past social history, past surgical history and problem list.   Health Maintenance:  Normal pap and negative HRHPV on 05/14/2014.    Review of Systems:  Pertinent items noted in HPI and remainder of comprehensive ROS otherwise negative.  Objective:  Physical Exam BP 117/77 mmHg  Pulse 76  Wt 149 lb (67.586 kg)  LMP 06/22/2015  Breastfeeding? Yes CONSTITUTIONAL: Well-developed, well-nourished female in no acute distress.  HENT:  Normocephalic, atraumatic. External right and left ear normal. Oropharynx is clear and moist EYES: Conjunctivae and EOM are normal. Pupils are equal, round, and reactive to light. No scleral icterus.  NECK: Normal range of motion, supple, no  masses SKIN: Skin is warm and dry. No rash noted. Not diaphoretic. No erythema. No pallor. [place]: Alert and oriented to person, place, and time. Normal reflexes, muscle tone coordination. No cranial nerve deficit noted. PSYCHIATRIC: Normal mood and affect. Normal behavior. Normal judgment and thought content. CARDIOVASCULAR: Normal heart rate noted RESPIRATORY: Effort and breath sounds normal, no problems with respiration noted ABDOMEN: Soft, no distention noted.   PELVIC: Normal appearing external genitalia; normal appearing vaginal mucosa. Malodorous discharge noted, sample obtained.   MUSCULOSKELETAL: Normal range of motion. No edema noted.   Assessment & Plan:  1. Vaginal discharge Will follow up results.  Metronidazole treated for presumed BV at the least. - Cervicovaginal ancillary only - metroNIDAZOLE (FLAGYL) 500 MG tablet; Take 1 tablet (500 mg total) by mouth 2 (two) times daily.  Dispense: 14 tablet; Refill: 2  2. Screen for STD (sexually transmitted disease) - Cervicovaginal ancillary only - Hepatitis B surface antigen - Hepatitis C antibody - HIV antibody - RPR Will follow up results and manage accordingly. Routine preventative health maintenance measures emphasized. Please refer to After Visit Summary for other counseling recommendations.   Total face-to-face time with patient: 15 minutes. Over 50% of encounter was spent on counseling and coordination of care.   Verita Schneiders, MD, [place] Attending Obstetrician & Gynecologist, [place] for [facility]

## 2015-07-04 LAB — CERVICOVAGINAL ANCILLARY ONLY: Wet Prep (BD Affirm): POSITIVE — AB

## 2015-07-04 LAB — HEPATITIS C ANTIBODY: Hep C Virus Ab: <0.1 s/co ratio (ref 0.0–0.9)

## 2015-07-04 LAB — HIV ANTIBODY (ROUTINE TESTING W REFLEX): HIV SCREEN 4TH GENERATION: NONREACTIVE

## 2015-07-04 LAB — RPR: RPR Ser Ql: NONREACTIVE

## 2015-07-04 LAB — HEPATITIS B SURFACE ANTIGEN: HEP B S AG: NEGATIVE

## 2015-07-05 ENCOUNTER — Telehealth: Payer: Self-pay | Admitting: *Deleted

## 2015-07-05 DIAGNOSIS — B9689 Other specified bacterial agents as the cause of diseases classified elsewhere: Secondary | ICD-10-CM

## 2015-07-05 DIAGNOSIS — N760 Acute vaginitis: Principal | ICD-10-CM

## 2015-07-05 MED ORDER — CLINDAMYCIN HCL 300 MG PO CAPS: 300.0000 mg | ORAL_CAPSULE | Freq: Two times a day (BID) | ORAL | Status: DC

## 2015-07-05 NOTE — Telephone Encounter (Signed)
I spoke with patient and have sent in Clindamycin for BV.

## 2015-07-05 NOTE — Telephone Encounter (Signed)
-----   Message from Francia Greaves sent at 07/04/2015  8:29 AM EST ----- Regarding: Rx  Contact: [phone] Was given a Rx for BV at last appointment, bottle says not to take it if you're breastfeeding, pharmacist confirmed this needs something else. Uses [facility] on the [address] of [address] and [address] in [place]. Also wants to know if lab results are back

## 2015-07-22 ENCOUNTER — Other Ambulatory Visit: Payer: Self-pay | Admitting: Obstetrics & Gynecology

## 2016-02-03 ENCOUNTER — Ambulatory Visit (INDEPENDENT_AMBULATORY_CARE_PROVIDER_SITE_OTHER): Payer: 59 | Admitting: Obstetrics & Gynecology

## 2016-02-03 ENCOUNTER — Encounter: Payer: Self-pay | Admitting: Obstetrics & Gynecology

## 2016-02-03 VITALS — BP 109/74 | HR 78 | Resp 18 | Ht 67.0 in | Wt 139.0 lb

## 2016-02-03 DIAGNOSIS — Z1151 Encounter for screening for human papillomavirus (HPV): Secondary | ICD-10-CM | POA: Diagnosis not present

## 2016-02-03 DIAGNOSIS — Z124 Encounter for screening for malignant neoplasm of cervix: Secondary | ICD-10-CM | POA: Diagnosis not present

## 2016-02-03 DIAGNOSIS — Z113 Encounter for screening for infections with a predominantly sexual mode of transmission: Secondary | ICD-10-CM | POA: Diagnosis not present

## 2016-02-03 DIAGNOSIS — Z01419 Encounter for gynecological examination (general) (routine) without abnormal findings: Secondary | ICD-10-CM

## 2016-02-03 NOTE — Patient Instructions (Signed)
Preventive Care for Adults, Female A healthy lifestyle and preventive care can promote health and wellness. Preventive health guidelines for women include the following key practices.  A routine yearly physical is a good way to check with your health care provider about your health and preventive screening. It is a chance to share any concerns and updates on your health and to receive a thorough exam.  Visit your dentist for a routine exam and preventive care every 6 months. Brush your teeth twice a day and floss once a day. Good oral hygiene prevents tooth decay and gum disease.  The frequency of eye exams is based on your age, health, family medical history, use of contact lenses, and other factors. Follow your health care provider's recommendations for frequency of eye exams.  Eat a healthy diet. Foods like vegetables, fruits, whole grains, low-fat dairy products, and lean protein foods contain the nutrients you need without too many calories. Decrease your intake of foods high in solid fats, added sugars, and salt. Eat the right amount of calories for you.Get information about a proper diet from your health care provider, if necessary.  Regular physical exercise is one of the most important things you can do for your health. Most adults should get at least 150 minutes of moderate-intensity exercise (any activity that increases your heart rate and causes you to sweat) each week. In addition, most adults need muscle-strengthening exercises on 2 or more days a week.  Maintain a healthy weight. The body mass index (BMI) is a screening tool to identify possible weight problems. It provides an estimate of body fat based on height and weight. Your health care provider can find your BMI and can help you achieve or maintain a healthy weight.For adults 20 years and older:  A BMI below 18.5 is considered underweight.  A BMI of 18.5 to 24.9 is normal.  A BMI of 25 to 29.9 is considered overweight.  A  BMI of 30 and above is considered obese.  Maintain normal blood lipids and cholesterol levels by exercising and minimizing your intake of saturated fat. Eat a balanced diet with plenty of fruit and vegetables. Blood tests for lipids and cholesterol should begin at age 45 and be repeated every 5 years. If your lipid or cholesterol levels are high, you are over 50, or you are at high risk for heart disease, you may need your cholesterol levels checked more frequently.Ongoing high lipid and cholesterol levels should be treated with medicines if diet and exercise are not working.  If you smoke, find out from your health care provider how to quit. If you do not use tobacco, do not start.  Lung cancer screening is recommended for adults aged 45-80 years who are at high risk for developing lung cancer because of a history of smoking. A yearly low-dose CT scan of the lungs is recommended for people who have at least a 30-pack-year history of smoking and are a current smoker or have quit within the past 15 years. A pack year of smoking is smoking an average of 1 pack of cigarettes a day for 1 year (for example: 1 pack a day for 30 years or 2 packs a day for 15 years). Yearly screening should continue until the smoker has stopped smoking for at least 15 years. Yearly screening should be stopped for people who develop a health problem that would prevent them from having lung cancer treatment.  If you are pregnant, do not drink alcohol. If you are  breastfeeding, be very cautious about drinking alcohol. If you are not pregnant and choose to drink alcohol, do not have more than 1 drink per day. One drink is considered to be 12 ounces (355 mL) of beer, 5 ounces (148 mL) of wine, or 1.5 ounces (44 mL) of liquor.  Avoid use of street drugs. Do not share needles with anyone. Ask for help if you need support or instructions about stopping the use of drugs.  High blood pressure causes heart disease and increases the risk  of stroke. Your blood pressure should be checked at least every 1 to 2 years. Ongoing high blood pressure should be treated with medicines if weight loss and exercise do not work.  If you are 55-79 years old, ask your health care provider if you should take aspirin to prevent strokes.  Diabetes screening is done by taking a blood sample to check your blood glucose level after you have not eaten for a certain period of time (fasting). If you are not overweight and you do not have risk factors for diabetes, you should be screened once every 3 years starting at age 45. If you are overweight or obese and you are 40-70 years of age, you should be screened for diabetes every year as part of your cardiovascular risk assessment.  Breast cancer screening is essential preventive care for women. You should practice "breast self-awareness." This means understanding the normal appearance and feel of your breasts and may include breast self-examination. Any changes detected, no matter how small, should be reported to a health care provider. Women in their 20s and 30s should have a clinical breast exam (CBE) by a health care provider as part of a regular health exam every 1 to 3 years. After age 40, women should have a CBE every year. Starting at age 40, women should consider having a mammogram (breast X-ray test) every year. Women who have a family history of breast cancer should talk to their health care provider about genetic screening. Women at a high risk of breast cancer should talk to their health care providers about having an MRI and a mammogram every year.  Breast cancer gene (BRCA)-related cancer risk assessment is recommended for women who have family members with BRCA-related cancers. BRCA-related cancers include breast, ovarian, tubal, and peritoneal cancers. Having family members with these cancers may be associated with an increased risk for harmful changes (mutations) in the breast cancer genes BRCA1 and  BRCA2. Results of the assessment will determine the need for genetic counseling and BRCA1 and BRCA2 testing.  Your health care provider may recommend that you be screened regularly for cancer of the pelvic organs (ovaries, uterus, and vagina). This screening involves a pelvic examination, including checking for microscopic changes to the surface of your cervix (Pap test). You may be encouraged to have this screening done every 3 years, beginning at age 21.  For women ages 30-65, health care providers may recommend pelvic exams and Pap testing every 3 years, or they may recommend the Pap and pelvic exam, combined with testing for human papilloma virus (HPV), every 5 years. Some types of HPV increase your risk of cervical cancer. Testing for HPV may also be done on women of any age with unclear Pap test results.  Other health care providers may not recommend any screening for nonpregnant women who are considered low risk for pelvic cancer and who do not have symptoms. Ask your health care provider if a screening pelvic exam is right for   you.  If you have had past treatment for cervical cancer or a condition that could lead to cancer, you need Pap tests and screening for cancer for at least 20 years after your treatment. If Pap tests have been discontinued, your risk factors (such as having a new sexual partner) need to be reassessed to determine if screening should resume. Some women have medical problems that increase the chance of getting cervical cancer. In these cases, your health care provider may recommend more frequent screening and Pap tests.  Colorectal cancer can be detected and often prevented. Most routine colorectal cancer screening begins at the age of 50 years and continues through age 75 years. However, your health care provider may recommend screening at an earlier age if you have risk factors for colon cancer. On a yearly basis, your health care provider may provide home test kits to check  for hidden blood in the stool. Use of a small camera at the end of a tube, to directly examine the colon (sigmoidoscopy or colonoscopy), can detect the earliest forms of colorectal cancer. Talk to your health care provider about this at age 50, when routine screening begins. Direct exam of the colon should be repeated every 5-10 years through age 75 years, unless early forms of precancerous polyps or small growths are found.  People who are at an increased risk for hepatitis B should be screened for this virus. You are considered at high risk for hepatitis B if:  You were born in a country where hepatitis B occurs often. Talk with your health care provider about which countries are considered high risk.  Your parents were born in a high-risk country and you have not received a shot to protect against hepatitis B (hepatitis B vaccine).  You have HIV or AIDS.  You use needles to inject street drugs.  You live with, or have sex with, someone who has hepatitis B.  You get hemodialysis treatment.  You take certain medicines for conditions like cancer, organ transplantation, and autoimmune conditions.  Hepatitis C blood testing is recommended for all people born from 1945 through 1965 and any individual with known risks for hepatitis C.  Practice safe sex. Use condoms and avoid high-risk sexual practices to reduce the spread of sexually transmitted infections (STIs). STIs include gonorrhea, chlamydia, syphilis, trichomonas, herpes, HPV, and human immunodeficiency virus (HIV). Herpes, HIV, and HPV are viral illnesses that have no cure. They can result in disability, cancer, and death.  You should be screened for sexually transmitted illnesses (STIs) including gonorrhea and chlamydia if:  You are sexually active and are younger than 24 years.  You are older than 24 years and your health care provider tells you that you are at risk for this type of infection.  Your sexual activity has changed  since you were last screened and you are at an increased risk for chlamydia or gonorrhea. Ask your health care provider if you are at risk.  If you are at risk of being infected with HIV, it is recommended that you take a prescription medicine daily to prevent HIV infection. This is called preexposure prophylaxis (PrEP). You are considered at risk if:  You are sexually active and do not regularly use condoms or know the HIV status of your partner(s).  You take drugs by injection.  You are sexually active with a partner who has HIV.  Talk with your health care provider about whether you are at high risk of being infected with HIV. If   you choose to begin PrEP, you should first be tested for HIV. You should then be tested every 3 months for as long as you are taking PrEP.  Osteoporosis is a disease in which the bones lose minerals and strength with aging. This can result in serious bone fractures or breaks. The risk of osteoporosis can be identified using a bone density scan. Women ages 67 years and over and women at risk for fractures or osteoporosis should discuss screening with their health care providers. Ask your health care provider whether you should take a calcium supplement or vitamin D to reduce the rate of osteoporosis.  Menopause can be associated with physical symptoms and risks. Hormone replacement therapy is available to decrease symptoms and risks. You should talk to your health care provider about whether hormone replacement therapy is right for you.  Use sunscreen. Apply sunscreen liberally and repeatedly throughout the day. You should seek shade when your shadow is shorter than you. Protect yourself by wearing long sleeves, pants, a wide-brimmed hat, and sunglasses year round, whenever you are outdoors.  Once a month, do a whole body skin exam, using a mirror to look at the skin on your back. Tell your health care provider of new moles, moles that have irregular borders, moles that  are larger than a pencil eraser, or moles that have changed in shape or color.  Stay current with required vaccines (immunizations).  Influenza vaccine. All adults should be immunized every year.  Tetanus, diphtheria, and acellular pertussis (Td, Tdap) vaccine. Pregnant women should receive 1 dose of Tdap vaccine during each pregnancy. The dose should be obtained regardless of the length of time since the last dose. Immunization is preferred during the 27th-36th week of gestation. An adult who has not previously received Tdap or who does not know her vaccine status should receive 1 dose of Tdap. This initial dose should be followed by tetanus and diphtheria toxoids (Td) booster doses every 10 years. Adults with an unknown or incomplete history of completing a 3-dose immunization series with Td-containing vaccines should begin or complete a primary immunization series including a Tdap dose. Adults should receive a Td booster every 10 years.  Varicella vaccine. An adult without evidence of immunity to varicella should receive 2 doses or a second dose if she has previously received 1 dose. Pregnant females who do not have evidence of immunity should receive the first dose after pregnancy. This first dose should be obtained before leaving the health care facility. The second dose should be obtained 4-8 weeks after the first dose.  Human papillomavirus (HPV) vaccine. Females aged 13-26 years who have not received the vaccine previously should obtain the 3-dose series. The vaccine is not recommended for use in pregnant females. However, pregnancy testing is not needed before receiving a dose. If a female is found to be pregnant after receiving a dose, no treatment is needed. In that case, the remaining doses should be delayed until after the pregnancy. Immunization is recommended for any person with an immunocompromised condition through the age of 61 years if she did not get any or all doses earlier. During the  3-dose series, the second dose should be obtained 4-8 weeks after the first dose. The third dose should be obtained 24 weeks after the first dose and 16 weeks after the second dose.  Zoster vaccine. One dose is recommended for adults aged 30 years or older unless certain conditions are present.  Measles, mumps, and rubella (MMR) vaccine. Adults born  before 1957 generally are considered immune to measles and mumps. Adults born in 1957 or later should have 1 or more doses of MMR vaccine unless there is a contraindication to the vaccine or there is laboratory evidence of immunity to each of the three diseases. A routine second dose of MMR vaccine should be obtained at least 28 days after the first dose for students attending postsecondary schools, health care workers, or international travelers. People who received inactivated measles vaccine or an unknown type of measles vaccine during 1963-1967 should receive 2 doses of MMR vaccine. People who received inactivated mumps vaccine or an unknown type of mumps vaccine before 1979 and are at high risk for mumps infection should consider immunization with 2 doses of MMR vaccine. For females of childbearing age, rubella immunity should be determined. If there is no evidence of immunity, females who are not pregnant should be vaccinated. If there is no evidence of immunity, females who are pregnant should delay immunization until after pregnancy. Unvaccinated health care workers born before 1957 who lack laboratory evidence of measles, mumps, or rubella immunity or laboratory confirmation of disease should consider measles and mumps immunization with 2 doses of MMR vaccine or rubella immunization with 1 dose of MMR vaccine.  Pneumococcal 13-valent conjugate (PCV13) vaccine. When indicated, a person who is uncertain of his immunization history and has no record of immunization should receive the PCV13 vaccine. All adults 65 years of age and older should receive this  vaccine. An adult aged 19 years or older who has certain medical conditions and has not been previously immunized should receive 1 dose of PCV13 vaccine. This PCV13 should be followed with a dose of pneumococcal polysaccharide (PPSV23) vaccine. Adults who are at high risk for pneumococcal disease should obtain the PPSV23 vaccine at least 8 weeks after the dose of PCV13 vaccine. Adults older than 32 years of age who have normal immune system function should obtain the PPSV23 vaccine dose at least 1 year after the dose of PCV13 vaccine.  Pneumococcal polysaccharide (PPSV23) vaccine. When PCV13 is also indicated, PCV13 should be obtained first. All adults aged 65 years and older should be immunized. An adult younger than age 65 years who has certain medical conditions should be immunized. Any person who resides in a nursing home or long-term care facility should be immunized. An adult smoker should be immunized. People with an immunocompromised condition and certain other conditions should receive both PCV13 and PPSV23 vaccines. People with human immunodeficiency virus (HIV) infection should be immunized as soon as possible after diagnosis. Immunization during chemotherapy or radiation therapy should be avoided. Routine use of PPSV23 vaccine is not recommended for American Indians, [place] Natives, or people younger than 65 years unless there are medical conditions that require PPSV23 vaccine. When indicated, people who have unknown immunization and have no record of immunization should receive PPSV23 vaccine. One-time revaccination 5 years after the first dose of PPSV23 is recommended for people aged 19-64 years who have chronic kidney failure, nephrotic syndrome, asplenia, or immunocompromised conditions. People who received 1-2 doses of PPSV23 before age 65 years should receive another dose of PPSV23 vaccine at age 65 years or later if at least 5 years have passed since the previous dose. Doses of PPSV23 are not  needed for people immunized with PPSV23 at or after age 65 years.  Meningococcal vaccine. Adults with asplenia or persistent complement component deficiencies should receive 2 doses of quadrivalent meningococcal conjugate (MenACWY-D) vaccine. The doses should be obtained   at least 2 months apart. Microbiologists working with certain meningococcal bacteria, [place] recruits, people at risk during an outbreak, and people who travel to or live in countries with a high rate of meningitis should be immunized. A first-year college student up through age 34 years who is living in a residence hall should receive a dose if she did not receive a dose on or after her 16th birthday. Adults who have certain high-risk conditions should receive one or more doses of vaccine.  Hepatitis A vaccine. Adults who wish to be protected from this disease, have certain high-risk conditions, work with hepatitis A-infected animals, work in hepatitis A research labs, or travel to or work in countries with a high rate of hepatitis A should be immunized. Adults who were previously unvaccinated and who anticipate close contact with an international adoptee during the first 60 days after arrival in the [place] from a country with a high rate of hepatitis A should be immunized.  Hepatitis B vaccine. Adults who wish to be protected from this disease, have certain high-risk conditions, may be exposed to blood or other infectious body fluids, are household contacts or sex partners of hepatitis B positive people, are clients or workers in certain care facilities, or travel to or work in countries with a high rate of hepatitis B should be immunized.  Haemophilus influenzae type b (Hib) vaccine. A previously unvaccinated person with asplenia or sickle cell disease or having a scheduled splenectomy should receive 1 dose of Hib vaccine. Regardless of previous immunization, a recipient of a hematopoietic stem cell transplant should receive a  3-dose series 6-12 months after her successful transplant. Hib vaccine is not recommended for adults with HIV infection. Preventive Services / Frequency Ages 35 to 4 years  Blood pressure check.** / Every 3-5 years.  Lipid and cholesterol check.** / Every 5 years beginning at age 60.  Clinical breast exam.** / Every 3 years for women in their 71s and 10s.  BRCA-related cancer risk assessment.** / For women who have family members with a BRCA-related cancer (breast, ovarian, tubal, or peritoneal cancers).  Pap test.** / Every 2 years from ages 76 through 26. Every 3 years starting at age 61 through age 76 or 93 with a history of 3 consecutive normal Pap tests.  HPV screening.** / Every 3 years from ages 37 through ages 60 to 51 with a history of 3 consecutive normal Pap tests.  Hepatitis C blood test.** / For any individual with known risks for hepatitis C.  Skin self-exam. / Monthly.  Influenza vaccine. / Every year.  Tetanus, diphtheria, and acellular pertussis (Tdap, Td) vaccine.** / Consult your health care provider. Pregnant women should receive 1 dose of Tdap vaccine during each pregnancy. 1 dose of Td every 10 years.  Varicella vaccine.** / Consult your health care provider. Pregnant females who do not have evidence of immunity should receive the first dose after pregnancy.  HPV vaccine. / 3 doses over 6 months, if 93 and younger. The vaccine is not recommended for use in pregnant females. However, pregnancy testing is not needed before receiving a dose.  Measles, mumps, rubella (MMR) vaccine.** / You need at least 1 dose of MMR if you were born in 1957 or later. You may also need a 2nd dose. For females of childbearing age, rubella immunity should be determined. If there is no evidence of immunity, females who are not pregnant should be vaccinated. If there is no evidence of immunity, females who are  pregnant should delay immunization until after pregnancy.  Pneumococcal  13-valent conjugate (PCV13) vaccine.** / Consult your health care provider.  Pneumococcal polysaccharide (PPSV23) vaccine.** / 1 to 2 doses if you smoke cigarettes or if you have certain conditions.  Meningococcal vaccine.** / 1 dose if you are age 68 to 8 years and a Market researcher living in a residence hall, or have one of several medical conditions, you need to get vaccinated against meningococcal disease. You may also need additional booster doses.  Hepatitis A vaccine.** / Consult your health care provider.  Hepatitis B vaccine.** / Consult your health care provider.  Haemophilus influenzae type b (Hib) vaccine.** / Consult your health care provider. Ages 7 to 53 years  Blood pressure check.** / Every year.  Lipid and cholesterol check.** / Every 5 years beginning at age 25 years.  Lung cancer screening. / Every year if you are aged 11-80 years and have a 30-pack-year history of smoking and currently smoke or have quit within the past 15 years. Yearly screening is stopped once you have quit smoking for at least 15 years or develop a health problem that would prevent you from having lung cancer treatment.  Clinical breast exam.** / Every year after age 48 years.  BRCA-related cancer risk assessment.** / For women who have family members with a BRCA-related cancer (breast, ovarian, tubal, or peritoneal cancers).  Mammogram.** / Every year beginning at age 41 years and continuing for as long as you are in good health. Consult with your health care provider.  Pap test.** / Every 3 years starting at age 65 years through age 37 or 70 years with a history of 3 consecutive normal Pap tests.  HPV screening.** / Every 3 years from ages 72 years through ages 60 to 40 years with a history of 3 consecutive normal Pap tests.  Fecal occult blood test (FOBT) of stool. / Every year beginning at age 21 years and continuing until age 5 years. You may not need to do this test if you get  a colonoscopy every 10 years.  Flexible sigmoidoscopy or colonoscopy.** / Every 5 years for a flexible sigmoidoscopy or every 10 years for a colonoscopy beginning at age 35 years and continuing until age 48 years.  Hepatitis C blood test.** / For all people born from [birth year] through 1965 and any individual with known risks for hepatitis C.  Skin self-exam. / Monthly.  Influenza vaccine. / Every year.  Tetanus, diphtheria, and acellular pertussis (Tdap/Td) vaccine.** / Consult your health care provider. Pregnant women should receive 1 dose of Tdap vaccine during each pregnancy. 1 dose of Td every 10 years.  Varicella vaccine.** / Consult your health care provider. Pregnant females who do not have evidence of immunity should receive the first dose after pregnancy.  Zoster vaccine.** / 1 dose for adults aged 30 years or older.  Measles, mumps, rubella (MMR) vaccine.** / You need at least 1 dose of MMR if you were born in 1957 or later. You may also need a second dose. For females of childbearing age, rubella immunity should be determined. If there is no evidence of immunity, females who are not pregnant should be vaccinated. If there is no evidence of immunity, females who are pregnant should delay immunization until after pregnancy.  Pneumococcal 13-valent conjugate (PCV13) vaccine.** / Consult your health care provider.  Pneumococcal polysaccharide (PPSV23) vaccine.** / 1 to 2 doses if you smoke cigarettes or if you have certain conditions.  Meningococcal vaccine.** /  Consult your health care provider.  Hepatitis A vaccine.** / Consult your health care provider.  Hepatitis B vaccine.** / Consult your health care provider.  Haemophilus influenzae type b (Hib) vaccine.** / Consult your health care provider. Ages 64 years and over  Blood pressure check.** / Every year.  Lipid and cholesterol check.** / Every 5 years beginning at age 23 years.  Lung cancer screening. / Every year if you  are aged 16-80 years and have a 30-pack-year history of smoking and currently smoke or have quit within the past 15 years. Yearly screening is stopped once you have quit smoking for at least 15 years or develop a health problem that would prevent you from having lung cancer treatment.  Clinical breast exam.** / Every year after age 74 years.  BRCA-related cancer risk assessment.** / For women who have family members with a BRCA-related cancer (breast, ovarian, tubal, or peritoneal cancers).  Mammogram.** / Every year beginning at age 44 years and continuing for as long as you are in good health. Consult with your health care provider.  Pap test.** / Every 3 years starting at age 58 years through age 22 or 39 years with 3 consecutive normal Pap tests. Testing can be stopped between 65 and 70 years with 3 consecutive normal Pap tests and no abnormal Pap or HPV tests in the past 10 years.  HPV screening.** / Every 3 years from ages 64 years through ages 70 or 61 years with a history of 3 consecutive normal Pap tests. Testing can be stopped between 65 and 70 years with 3 consecutive normal Pap tests and no abnormal Pap or HPV tests in the past 10 years.  Fecal occult blood test (FOBT) of stool. / Every year beginning at age 40 years and continuing until age 27 years. You may not need to do this test if you get a colonoscopy every 10 years.  Flexible sigmoidoscopy or colonoscopy.** / Every 5 years for a flexible sigmoidoscopy or every 10 years for a colonoscopy beginning at age 7 years and continuing until age 32 years.  Hepatitis C blood test.** / For all people born from [birth year] through 1967 and any individual with known risks for hepatitis C.  Osteoporosis screening.** / A one-time screening for women ages 30 years and over and women at risk for fractures or osteoporosis.  Skin self-exam. / Monthly.  Influenza vaccine. / Every year.  Tetanus, diphtheria, and acellular pertussis (Tdap/Td)  vaccine.** / 1 dose of Td every 10 years.  Varicella vaccine.** / Consult your health care provider.  Zoster vaccine.** / 1 dose for adults aged 35 years or older.  Pneumococcal 13-valent conjugate (PCV13) vaccine.** / Consult your health care provider.  Pneumococcal polysaccharide (PPSV23) vaccine.** / 1 dose for all adults aged 46 years and older.  Meningococcal vaccine.** / Consult your health care provider.  Hepatitis A vaccine.** / Consult your health care provider.  Hepatitis B vaccine.** / Consult your health care provider.  Haemophilus influenzae type b (Hib) vaccine.** / Consult your health care provider. ** Family history and personal history of risk and conditions may change your health care provider's recommendations.   This information is not intended to replace advice given to you by your health care provider. Make sure you discuss any questions you have with your health care provider.   Document Released: 09/08/2001 Document Revised: 08/03/2014 Document Reviewed: 12/08/2010 Elsevier Interactive Patient Education [employer].

## 2016-02-03 NOTE — Progress Notes (Signed)
GYNECOLOGY CLINIC ANNUAL PREVENTATIVE CARE ENCOUNTER NOTE  Subjective:   Frances Cohen is a 30 y.o. [phone] female here for a routine annual gynecologic exam.  Current complaints: desires to be tested for cervical HPV as she has never been tested for this.  Also wants STI screening.  Denies abnormal vaginal bleeding, discharge, pelvic pain, problems with intercourse or other gynecologic concerns.    Gynecologic History No LMP recorded. Contraception: tubal ligation Last Pap: 05/14/2014. Results were: normal   Obstetric History OB History  Gravida Para Term Preterm AB SAB TAB Ectopic Multiple Living  6 3 3  3 2    0 3    # Outcome Date GA Lbr Len/2nd Weight Sex Delivery Anes PTL Lv  6 Term 01/01/15 [redacted]w[redacted]d  8 lb 8.9 oz (3.881 kg) F CS-LTranv Spinal  Y  5 SAB 02/10/14 [redacted]w[redacted]d         4 Term 12/10/11 [redacted]w[redacted]d  7 lb 14.8 oz (3.595 kg) F CS-LTranv Spinal  Y  3 SAB 11/2010 [redacted]w[redacted]d         2 Term 05/2009 [redacted]w[redacted]d  8 lb 3 oz (3.714 kg) M CS-LTranv Spinal  Y  1 AB 08/2006 [redacted]w[redacted]d       N      Past Medical History  Diagnosis Date  . SAB (spontaneous abortion) 12/2010    TWO  . Abnormal Pap smear 2003  . Hernia 09-2011  . Migraine   . Fibroid   . GERD (gastroesophageal reflux disease)     using tums     Past Surgical History  Procedure Laterality Date  . Tonsillectomy    . Wisdom tooth extraction  2004     x4  . Cesarean section    . Cesarean section  12/10/2011    Procedure: CESAREAN SECTION;  Surgeon: Mora Bellman, MD;  Location: [facility];  Service: Gynecology;  Laterality: N/A;  . Cesarean section with bilateral tubal ligation Bilateral 01/01/2015    Procedure: CESAREAN SECTION WITH BILATERAL TUBAL LIGATION;  Surgeon: Guss Bunde, MD;  Location: [facility];  Service: Obstetrics;  Laterality: Bilateral;    No current outpatient prescriptions on file prior to visit.   No current facility-administered medications on file prior to visit.    Allergies  Allergen Reactions  . Penicillins  Anaphylaxis and Swelling  . Shellfish Allergy Hives and Other (See Comments)    Gets a tight itchy throat.    Social History   Social History  . Marital Status: Married    Spouse Name: N/A  . Number of Children: N/A  . Years of Education: N/A   Occupational History  . Not on file.   Social History Main Topics  . Smoking status: Never Smoker   . Smokeless tobacco: Never Used  . Alcohol Use: No  . Drug Use: No  . Sexual Activity:    Partners: Male    Birth Control/ Protection: Surgical   Other Topics Concern  . Not on file   Social History Narrative    Family History  Problem Relation Age of Onset  . Diabetes Mother   . Lung cancer Paternal Aunt   . Breast cancer Paternal Grandmother 66  . Prostate cancer Paternal Grandfather     The following portions of the patient's history were reviewed and updated as appropriate: allergies, current medications, past family history, past medical history, past social history, past surgical history and problem list.  Review of Systems Pertinent items noted in HPI and remainder of  comprehensive ROS otherwise negative.   Objective:  BP 109/74 mmHg  Pulse 78  Resp 18  Ht 5\' 7"  (1.702 m)  Wt 139 lb (63.05 kg)  BMI 21.77 kg/m2 CONSTITUTIONAL: Well-developed, well-nourished female in no acute distress.  HENT:  Normocephalic, atraumatic, External right and left ear normal. Oropharynx is clear and moist EYES: Conjunctivae and EOM are normal. Pupils are equal, round, and reactive to light. No scleral icterus.  NECK: Normal range of motion, supple, no masses.  Normal thyroid.  SKIN: Skin is warm and dry. No rash noted. Not diaphoretic. No erythema. No pallor. NEUROLOGIC: Alert and oriented to person, place, and time. Normal reflexes, muscle tone coordination. No cranial nerve deficit noted. PSYCHIATRIC: Normal mood and affect. Normal behavior. Normal judgment and thought content. CARDIOVASCULAR: Normal heart rate noted, regular  rhythm RESPIRATORY: Clear to auscultation bilaterally. Effort and breath sounds normal, no problems with respiration noted. BREASTS: Symmetric in size. No masses, skin changes, nipple drainage, or lymphadenopathy. ABDOMEN: Soft, normal bowel sounds, no distention noted.  No tenderness, rebound or guarding.  PELVIC: Normal appearing external genitalia; normal appearing vaginal mucosa and cervix.  No abnormal discharge noted.  Pap smear obtained.  Normal uterine size, no other palpable masses, no uterine or adnexal tenderness. MUSCULOSKELETAL: Normal range of motion. No tenderness.  No cyanosis, clubbing, or edema.  2+ distal pulses.   Assessment:  Annual gynecologic examination with pap smear; patient was told that HPV is now routinely checked with pap smear testing and she was very happy about this.  Also wanted STI testing.   Plan:  Will follow up results of pap smear and STI screening and manage accordingly. Routine preventative health maintenance measures emphasized. Please refer to After Visit Summary for other counseling recommendations.    Verita Schneiders, MD, [place] Attending Obstetrician & Gynecologist, [place] [facility]

## 2016-02-04 LAB — RPR

## 2016-02-04 LAB — HIV ANTIBODY (ROUTINE TESTING W REFLEX): HIV 1&2 Ab, 4th Generation: NONREACTIVE

## 2016-02-04 LAB — HEPATITIS B SURFACE ANTIGEN: Hepatitis B Surface Ag: NEGATIVE

## 2016-02-04 LAB — HEPATITIS C ANTIBODY: HCV Ab: NEGATIVE

## 2016-02-06 LAB — CYTOLOGY - PAP

## 2017-04-11 ENCOUNTER — Encounter (HOSPITAL_COMMUNITY): Payer: Self-pay | Admitting: *Deleted

## 2017-04-11 ENCOUNTER — Emergency Department (HOSPITAL_COMMUNITY)
Admission: EM | Admit: 2017-04-11 | Discharge: 2017-04-11 | Disposition: A | Discharge status: Home / Self Care | Payer: 59 | Attending: Emergency Medicine | Admitting: Emergency Medicine

## 2017-04-11 DIAGNOSIS — R1030 Lower abdominal pain, unspecified: Secondary | ICD-10-CM | POA: Diagnosis present

## 2017-04-11 LAB — URINALYSIS, ROUTINE W REFLEX MICROSCOPIC
BILIRUBIN URINE: NEGATIVE
GLUCOSE, UA: NEGATIVE mg/dL
Hgb urine dipstick: NEGATIVE
KETONES UR: NEGATIVE mg/dL
Leukocytes, UA: NEGATIVE
Nitrite: NEGATIVE
PH: 6 (ref 5.0–8.0)
Protein, ur: NEGATIVE mg/dL
Specific Gravity, Urine: 1.014 (ref 1.005–1.030)

## 2017-04-11 LAB — COMPREHENSIVE METABOLIC PANEL
ALBUMIN: 4 g/dL (ref 3.5–5.0)
ALT: 13 U/L — ABNORMAL LOW (ref 14–54)
AST: 21 U/L (ref 15–41)
Alkaline Phosphatase: 62 U/L (ref 38–126)
Anion gap: 7 (ref 5–15)
BILIRUBIN TOTAL: 0.7 mg/dL (ref 0.3–1.2)
BUN: 8 mg/dL (ref 6–20)
CHLORIDE: 105 mmol/L (ref 101–111)
CO2: 25 mmol/L (ref 22–32)
Calcium: 8.9 mg/dL (ref 8.9–10.3)
Creatinine, Ser: 0.81 mg/dL (ref 0.44–1.00)
GFR calc Af Amer: >60 mL/min (ref >60)
GFR calc non Af Amer: >60 mL/min (ref >60)
GLUCOSE: 98 mg/dL (ref 65–99)
POTASSIUM: 3.5 mmol/L (ref 3.5–5.1)
Sodium: 137 mmol/L (ref 135–145)
Total Protein: 7 g/dL (ref 6.5–8.1)

## 2017-04-11 LAB — CBC
HEMATOCRIT: 42.6 % (ref 36.0–46.0)
Hemoglobin: 13.8 g/dL (ref 12.0–15.0)
MCH: 30.1 pg (ref 26.0–34.0)
MCHC: 32.4 g/dL (ref 30.0–36.0)
MCV: 93 fL (ref 78.0–100.0)
Platelets: 257 10*3/uL (ref 150–400)
RBC: 4.58 MIL/uL (ref 3.87–5.11)
RDW: 12.9 % (ref 11.5–15.5)
WBC: 5 10*3/uL (ref 4.0–10.5)

## 2017-04-11 LAB — I-STAT BETA HCG BLOOD, ED (MC, WL, AP ONLY): I-stat hCG, quantitative: <5 m[IU]/mL (ref <5)

## 2017-04-11 LAB — LIPASE, BLOOD: Lipase: 27 U/L (ref 11–51)

## 2017-04-11 MED ORDER — ACETAMINOPHEN 500 MG PO TABS
1000.0000 mg | ORAL_TABLET | Freq: Once | ORAL | Status: AC
  Administered 2017-04-11: 1000 mg via ORAL
  Filled 2017-04-11: qty 2

## 2017-04-11 MED ORDER — DICYCLOMINE HCL 10 MG PO CAPS
10.0000 mg | ORAL_CAPSULE | Freq: Once | ORAL | Status: AC
  Administered 2017-04-11: 10 mg via ORAL
  Filled 2017-04-11: qty 1

## 2017-04-11 NOTE — ED Triage Notes (Signed)
Pt reports lower abd pain with nausea. Denies urinary or vaginal symptoms. Has hx of hernia and fibroids. No acute distress is noted at triage.

## 2017-04-11 NOTE — ED Provider Notes (Signed)
[facility] DEPT Provider Note   CSN: [id number] Arrival date & time: 04/11/17  0932     History   Chief Complaint Chief Complaint  Patient presents with  . Abdominal Pain    HPI Frances Cohen is a 31 y.o. female.  31 year old female with past medical history including migraines, uterine fibroids, GERD who presents with abdominal pain. The patient reports several months of intermittent lower abdominal pain. She has been diagnosed with fibroids in the past and at first she thought the pain was related to this. Her OB/GYN started her on OCPs and she is not currently menstruating but yesterday she began having the same sharp lower abdominal pain again and she became worried. She denies any associated urinary symptoms, vaginal discharge, vomiting,  or fevers. She reports mild nausea. She has not tried any medications for her symptoms. She reports alternating constipation and diarrhea. She states sometimes when she eats certain things like granola bars it seems to make the pain worse. She has been diagnosed with umbilical hernia and possibly left inguinal hernia and has already had referral to surgery clinic, waiting for an appointment to be scheduled.   The history is provided by the patient.  Abdominal Pain      Past Medical History:  Diagnosis Date  . Abnormal Pap smear 2003  . Fibroid   . GERD (gastroesophageal reflux disease)    using tums   . Hernia 09-2011  . Migraine   . SAB (spontaneous abortion) 12/2010   TWO    Patient Active Problem List   Diagnosis Date Noted  . Femoral hernia 10/13/2011    Past Surgical History:  Procedure Laterality Date  . CESAREAN SECTION    . CESAREAN SECTION  12/10/2011   Procedure: CESAREAN SECTION;  Surgeon: Mora Bellman, MD;  Location: [facility];  Service: Gynecology;  Laterality: N/A;  . CESAREAN SECTION WITH BILATERAL TUBAL LIGATION Bilateral 01/01/2015   Procedure: CESAREAN SECTION WITH BILATERAL TUBAL LIGATION;  Surgeon: Guss Bunde, MD;  Location: [facility];  Service: Obstetrics;  Laterality: Bilateral;  . TONSILLECTOMY    . WISDOM TOOTH EXTRACTION  2004    x4    OB History    Gravida Para Term Preterm AB Living   6 3 3   3 3    SAB TAB Ectopic Multiple Live Births   2     0 3       Home Medications    Prior to Admission medications   Medication Sig Start Date End Date Taking? Authorizing Provider  NORLYDA 0.35 MG tablet Take 1 tablet by mouth daily. 04/06/17  Yes [provider]    Family History Family History  Problem Relation Age of Onset  . Diabetes Mother   . Lung cancer Paternal Aunt   . Breast cancer Paternal Grandmother 7  . Prostate cancer Paternal Grandfather     Social History Social History  Substance Use Topics  . Smoking status: Never Smoker  . Smokeless tobacco: Never Used  . Alcohol use No     Allergies   Penicillins and Shellfish allergy   Review of Systems Review of Systems  Gastrointestinal: Positive for abdominal pain.   All other systems reviewed and are negative except that which was mentioned in HPI   Physical Exam Updated Vital Signs BP 104/74 (BP Location: Left Arm)   Pulse 70   Temp 97.8 F (36.6 C) (Oral)   Resp 20   LMP 03/27/2017   SpO2 100%  Physical Exam  Constitutional: She is oriented to person, place, and time. She appears well-developed and well-nourished. No distress.  HENT:  Head: Normocephalic and atraumatic.  Moist mucous membranes  Eyes: Pupils are equal, round, and reactive to light. Conjunctivae are normal.  Neck: Neck supple.  Cardiovascular: Normal rate, regular rhythm and normal heart sounds.   No murmur heard. Pulmonary/Chest: Effort normal and breath sounds normal.  Abdominal: Soft. Bowel sounds are normal. She exhibits no distension and no mass. There is tenderness (across lower abdomen). There is no rebound and no guarding.  Small umbilical hernia with no protruding bowel or tenderness, cannot palpate any  obvious inguinal hernia  Musculoskeletal: She exhibits no edema.  Neurological: She is alert and oriented to person, place, and time.  Fluent speech  Skin: Skin is warm and dry.  Psychiatric: She has a normal mood and affect. Judgment normal.  Nursing note and vitals reviewed.    ED Treatments / Results  Labs (all labs ordered are listed, but only abnormal results are displayed) Labs Reviewed  COMPREHENSIVE METABOLIC PANEL - Abnormal; Notable for the following:       Result Value   ALT 13 (*)    All other components within normal limits  LIPASE, BLOOD  CBC  URINALYSIS, ROUTINE W REFLEX MICROSCOPIC  I-STAT BETA HCG BLOOD, ED ([facility], [facility], [facility] ONLY)    EKG  EKG Interpretation None       Radiology No results found.  Procedures Procedures (including critical care time)  Medications Ordered in ED Medications  acetaminophen (TYLENOL) tablet 1,000 mg (1,000 mg Oral Given 04/11/17 1403)  dicyclomine (BENTYL) capsule 10 mg (10 mg Oral Given 04/11/17 1403)     Initial Impression / Assessment and Plan / ED Course  I have reviewed the triage vital signs and the nursing notes.  Pertinent labs & imaging results that were available during my care of the patient were reviewed by me and considered in my medical decision making (see chart for details).     PT w/ several months intermittent abdominal pain and nausea, alternating constipation and diarrhea. Vital signs unremarkable, patient well-appearing, and all lab work was unremarkable. She did have tenderness across her lower abdomen which was not focal to right lower quadrant. Given that her symptoms have been ongoing for several months, I doubt acute process such as appendicitis or bowel obstruction and I do not feel she needs CT imaging at this time. I have recommended that she contact her OB/GYN for reevaluation and follow-up with her PCP for referral to GI if her symptoms continue. The waxing and waning nature for her symptoms and  alternating diarrhea and constipation do suggest a functional problem such as IBS. I do not feel any concerning hernias at this time but have been return precautions regarding signs of incarcerated/strangulated hernia. Patient able to drink apple juice with no problems in the ED.  I was waiting for patient to provide urine sample to r/o UTI. She gave urine and then eloped prior to results.  Final Clinical Impressions(s) / ED Diagnoses   Final diagnoses:  Lower abdominal pain    New Prescriptions New Prescriptions   No medications on file     Little, Wenda Overland, MD 04/11/17 1552

## 2017-04-11 NOTE — ED Notes (Signed)
Declined W/C at D/C and was escorted to lobby by RN. 

## 2017-09-03 ENCOUNTER — Ambulatory Visit: Payer: Self-pay | Admitting: General Surgery

## 2017-09-03 NOTE — H&P (Signed)
History of Present Illness Frances Spruce MD; 09/03/2017 12:04 PM) The patient is a 32 year old female who presents with an abdominal wall hernia. Referred by Ferd Hibbs. She has a two-year history of periumbilical pain. She is also had multiple C-sections and has some pain in her left groin area. She has multiple GI symptoms including intermittent constipation and diarrhea. The pain associated with the hernias is mainly with moving exercising or lifting.  She does not smoke. She does not have diabetes. She has no known collagen deficiency disorders.   Past Surgical History Dalbert Mayotte, [place]; 09/03/2017 11:18 AM) Cesarean Section - Multiple  Oral Surgery   Diagnostic Studies History Dalbert Mayotte, [place]; 09/03/2017 11:18 AM) Colonoscopy  never Mammogram  never Pap Smear  1-5 years ago  Allergies Dalbert Mayotte, CMA; 09/03/2017 11:19 AM) Allergies Reconciled   Medication History Dalbert Mayotte, CMA; 09/03/2017 11:19 AM) No Current Medications  Social History Dalbert Mayotte, CMA; 09/03/2017 11:18 AM) Caffeine use  Coffee, Tea. No alcohol use  No drug use  Tobacco use  Never smoker.  Family History Dalbert Mayotte, [place]; 09/03/2017 11:18 AM) Alcohol Abuse  Brother, Father, Mother, Sister. Arthritis  Family Members In General. Bleeding disorder  Mother. Cervical Cancer  Family Members In General. Diabetes Mellitus  Family Members In General, Mother. Heart Disease  Family Members In General. Hypertension  Family Members In General. Prostate Cancer  Family Members In General. Seizure disorder  Family Members In General.  Pregnancy / Birth History Dalbert Mayotte, [place]; 09/03/2017 11:18 AM) Age at menarche  50 years. Contraceptive History  Depo-provera, Oral contraceptives. Gravida  6 Length (months) of breastfeeding  12-24 Maternal age  11-25 Para  3 Regular periods   Other Problems Dalbert Mayotte, CMA; 09/03/2017 11:18 AM) Back Pain      Review of Systems Dalbert Mayotte CMA; 09/03/2017 11:18 AM) General Present- Fatigue, Weight Gain and Weight Loss. Not Present- Appetite Loss, Chills, Fever and Night Sweats. Skin Not Present- Change in Wart/Mole, Dryness, Hives, Jaundice, New Lesions, Non-Healing Wounds, Rash and Ulcer. HEENT Present- Wears glasses/contact lenses. Not Present- Earache, Hearing Loss, Hoarseness, Nose Bleed, Oral Ulcers, Ringing in the Ears, Seasonal Allergies, Sinus Pain, Sore Throat, Visual Disturbances and Yellow Eyes. Respiratory Present- Snoring. Not Present- Bloody sputum, Chronic Cough, Difficulty Breathing and Wheezing. Breast Not Present- Breast Mass, Breast Pain, Nipple Discharge and Skin Changes. Cardiovascular Not Present- Chest Pain, Difficulty Breathing Lying Down, Leg Cramps, Palpitations, Rapid Heart Rate, Shortness of Breath and Swelling of Extremities. Gastrointestinal Present- Abdominal Pain, Bloating, Change in Bowel Habits, Constipation, Excessive gas and Indigestion. Not Present- Bloody Stool, Chronic diarrhea, Difficulty Swallowing, Gets full quickly at meals, Hemorrhoids, Nausea, Rectal Pain and Vomiting. Female Genitourinary Present- Pelvic Pain. Not Present- Frequency, Nocturia, Painful Urination and Urgency. Musculoskeletal Not Present- Back Pain, Joint Pain, Joint Stiffness, Muscle Pain, Muscle Weakness and Swelling of Extremities. Neurological Not Present- Decreased Memory, Fainting, Headaches, Numbness, Seizures, Tingling, Tremor, Trouble walking and Weakness. Psychiatric Not Present- Anxiety, Bipolar, Change in Sleep Pattern, Depression, Fearful and Frequent crying. Endocrine Not Present- Cold Intolerance, Excessive Hunger, Hair Changes, Heat Intolerance, Hot flashes and New Diabetes. Hematology Not Present- Blood Thinners, Easy Bruising, Excessive bleeding, Gland problems, HIV and Persistent Infections.  Vitals Dalbert Mayotte CMA; 09/03/2017 11:20 AM) 09/03/2017 11:19 AM Weight:  157.4 lb Height: 67in Body Surface Area: 1.83 m Body Mass Index: 24.65 kg/m  Temp.: 96.11F  Pulse: 92 (Regular)  BP: 100/74 (Sitting, Left Arm, Standard)       Physical  Exam Frances Spruce MD; 09/03/2017 12:04 PM) General Mental Status-Alert. General Appearance-Cooperative. Orientation-Oriented X4. Posture-Normal posture.  Integumentary Global Assessment Normal Exam - Head/Face: no rashes, ulcers, lesions or evidence of photo damage. No palpable nodules or masses and Neck: no visible lesions or palpable masses.  Head and Neck Head-normocephalic, atraumatic with no lesions or palpable masses. Face Global Assessment - atraumatic. Thyroid Gland Characteristics - normal size and consistency.  Eye Eyeball - Bilateral-Extraocular movements intact. Sclera/Conjunctiva - Bilateral-No scleral icterus, No Discharge.  ENMT Nose and Sinuses Nose - no deformities observed, no swelling present.  Chest and Lung Exam Palpation Normal exam - Non-tender. Auscultation Breath sounds - Normal.  Cardiovascular Auscultation Rhythm - Regular. Heart Sounds - S1 WNL and S2 WNL. Carotid arteries - No Carotid bruit.  Abdomen Inspection Normal Exam - No Visible peristalsis, No Abnormal pulsations and No Paradoxical movements. Palpation/Percussion Normal exam - Soft, Non Tender, No Rebound tenderness, No Rigidity (guarding), No hepatosplenomegaly and No Palpable abdominal masses. Note: umbilical hernia, questionable left sided inguinal hernia or incisional hernia, moderate excess skin and 2 cm diastasis in the midabdomen   Peripheral Vascular Upper Extremity Palpation - Pulses bilaterally normal. Lower Extremity Palpation - Edema - Bilateral - No edema.  Neurologic Neurologic evaluation reveals -normal sensation and normal coordination.  Neuropsychiatric Mental status exam performed with findings of-able to articulate well with normal  speech/language, rate, volume and coherence and thought content normal with ability to perform basic computations and apply abstract reasoning.  Musculoskeletal Normal Exam - Bilateral-Upper Extremity Strength Normal and Lower Extremity Strength Normal.    Assessment & Plan Frances Spruce MD; 08/01/1094 04:54 AM) UMBILICAL HERNIA WITHOUT OBSTRUCTION AND WITHOUT GANGRENE (K42.9) Impression: 32 yo female with symptomatic umbilical hernia after pregnancies The patient has a symptomatic reducible hernia. We discussed the etiology of his hernia, the risk of it enlarging, incarceration, obstruction, strangulation, and that it is unlikely to get smaller or better on its own. We discussed operative options of laparoscopic vs open repair with mesh including the risks of recurrence, injury to intestines or abominal organs, or chronic pain associated with mesh. We decided to proceed with laparoscopic umbilical hernia repair with possible left inguinal hernia repair with mesh as outpatient Current Plans Pt Education - Pamphlet Given - Hernia Surgery: discussed with patient and provided information. The anatomy & physiology of the abdominal wall was discussed. The pathophysiology of hernias was discussed. Natural history risks without surgery including progeressive enlargement, pain, incarceration, & strangulation was discussed. Contributors to complications such as smoking, obesity, diabetes, prior surgery, etc were discussed.  I feel the risks of no intervention will lead to serious problems that outweigh the operative risks; therefore, I recommended surgery to reduce and repair the hernia. I explained laparoscopic techniques with possible need for an open approach. I noted the probable use of mesh to patch and/or buttress the hernia repair  Risks such as bleeding, infection, abscess, need for further treatment, heart attack, death, and other risks were discussed. I noted a good likelihood this will  help address the problem. Goals of post-operative recovery were discussed as well. Possibility that this will not correct all symptoms was explained. I stressed the importance of low-impact activity, aggressive pain control, avoiding constipation, & not pushing through pain to minimize risk of post-operative chronic pain or injury. Possibility of reherniation especially with smoking, obesity, diabetes, immunosuppression, and other health conditions was discussed. We will work to minimize complications.  An educational handout further explaining the pathology &  treatment options was given as well. Questions were answered. The patient expresses understanding & wishes to proceed with surgery.  You are being scheduled for surgery- Our schedulers will call you.  You should hear from our office's scheduling department within 5 working days about the location, date, and time of surgery. We try to make accommodations for patient's preferences in scheduling surgery, but sometimes the OR schedule or the surgeon's schedule prevents [place] from making those accommodations.  If you have not heard from our office [phone]) in 5 working days, call the office and ask for your surgeon's nurse.  If you have other questions about your diagnosis, plan, or surgery, call the office and ask for your surgeon's nurse.  LEFT INGUINAL HERNIA (K40.90) Impression: Questionable left inguinal hernia vs incisional hernia from c section. Will diagnose with laparscope at time of surgery.

## 2017-10-18 ENCOUNTER — Emergency Department (HOSPITAL_COMMUNITY)
Admission: EM | Admit: 2017-10-18 | Discharge: 2017-10-18 | Disposition: A | Discharge status: Home / Self Care | Payer: 59 | Attending: Emergency Medicine | Admitting: Emergency Medicine

## 2017-10-18 ENCOUNTER — Encounter (HOSPITAL_COMMUNITY): Payer: Self-pay | Admitting: Emergency Medicine

## 2017-10-18 ENCOUNTER — Emergency Department (HOSPITAL_COMMUNITY): Payer: 59

## 2017-10-18 ENCOUNTER — Other Ambulatory Visit: Payer: Self-pay

## 2017-10-18 DIAGNOSIS — Z79899 Other long term (current) drug therapy: Secondary | ICD-10-CM | POA: Diagnosis not present

## 2017-10-18 DIAGNOSIS — N83209 Unspecified ovarian cyst, unspecified side: Secondary | ICD-10-CM

## 2017-10-18 DIAGNOSIS — R1031 Right lower quadrant pain: Secondary | ICD-10-CM

## 2017-10-18 LAB — I-STAT BETA HCG BLOOD, ED (MC, WL, AP ONLY)

## 2017-10-18 LAB — URINALYSIS, ROUTINE W REFLEX MICROSCOPIC
BILIRUBIN URINE: NEGATIVE
Glucose, UA: NEGATIVE mg/dL
Hgb urine dipstick: NEGATIVE
Ketones, ur: NEGATIVE mg/dL
LEUKOCYTES UA: NEGATIVE
NITRITE: NEGATIVE
Protein, ur: NEGATIVE mg/dL
Specific Gravity, Urine: 1.031 — ABNORMAL HIGH (ref 1.005–1.030)
pH: 5 (ref 5.0–8.0)

## 2017-10-18 LAB — BASIC METABOLIC PANEL
ANION GAP: 8 (ref 5–15)
BUN: 12 mg/dL (ref 6–20)
CALCIUM: 9 mg/dL (ref 8.9–10.3)
CO2: 23 mmol/L (ref 22–32)
Chloride: 106 mmol/L (ref 101–111)
Creatinine, Ser: 0.89 mg/dL (ref 0.44–1.00)
GFR calc Af Amer: >60 mL/min (ref >60)
GLUCOSE: 101 mg/dL — AB (ref 65–99)
Potassium: 3.9 mmol/L (ref 3.5–5.1)
SODIUM: 137 mmol/L (ref 135–145)

## 2017-10-18 LAB — CBC
HCT: 41.1 % (ref 36.0–46.0)
HEMOGLOBIN: 13.7 g/dL (ref 12.0–15.0)
MCH: 30.3 pg (ref 26.0–34.0)
MCHC: 33.3 g/dL (ref 30.0–36.0)
MCV: 90.9 fL (ref 78.0–100.0)
Platelets: 248 10*3/uL (ref 150–400)
RBC: 4.52 MIL/uL (ref 3.87–5.11)
RDW: 13.2 % (ref 11.5–15.5)
WBC: 6.4 10*3/uL (ref 4.0–10.5)

## 2017-10-18 LAB — WET PREP, GENITAL
Sperm: NONE SEEN
Trich, Wet Prep: NONE SEEN
Yeast Wet Prep HPF POC: NONE SEEN

## 2017-10-18 LAB — GC/CHLAMYDIA PROBE AMP (~~LOC~~) NOT AT ARMC
CHLAMYDIA, DNA PROBE: NEGATIVE
NEISSERIA GONORRHEA: NEGATIVE

## 2017-10-18 MED ORDER — IBUPROFEN 600 MG PO TABS: 600.0000 mg | ORAL_TABLET | Freq: Four times a day (QID) | ORAL | 0 refills | Status: DC | PRN

## 2017-10-18 MED ORDER — FENTANYL CITRATE (PF) 100 MCG/2ML IJ SOLN
50.0000 ug | Freq: Once | INTRAMUSCULAR | Status: AC
  Administered 2017-10-18: 50 ug via INTRAVENOUS
  Filled 2017-10-18: qty 2

## 2017-10-18 MED ORDER — METRONIDAZOLE 500 MG PO TABS: 500.0000 mg | ORAL_TABLET | Freq: Two times a day (BID) | ORAL | 0 refills | Status: DC

## 2017-10-18 MED ORDER — IOPAMIDOL (ISOVUE-300) INJECTION 61%
INTRAVENOUS | Status: AC
  Administered 2017-10-18: 100 mL
  Filled 2017-10-18: qty 100

## 2017-10-18 NOTE — ED Provider Notes (Signed)
[facility] EMERGENCY DEPARTMENT Provider Note   CSN: [id number] Arrival date & time: 10/18/17  2130     History   Chief Complaint Chief Complaint  Patient presents with  . Groin Pain    HPI Frances Cohen is a 32 y.o. female.  HPI  33 year old female, prior history of tubal ligation, presents with chief complaint of right pelvic pain.  This pain seems to be something that woke her up from sleep, acute in onset, located in the right lower abdomen with radiation to the right flank without any associated vaginal discharge as dysuria, hematuria, nausea vomiting or diaphoresis.  She had this pain similar to this but much more mild a week ago but it resolved spontaneously.  She does have a history of ovarian cyst as well as uterine fibroids, she has had 3 cesarean sections and a tubal ligation but no other abdominal surgery.  Currently the pain is moderate.  Past Medical History:  Diagnosis Date  . Abnormal Pap smear 2003  . Fibroid   . GERD (gastroesophageal reflux disease)    using tums   . Hernia 09-2011  . Migraine   . SAB (spontaneous abortion) 12/2010   TWO    Patient Active Problem List   Diagnosis Date Noted  . Femoral hernia 10/13/2011    Past Surgical History:  Procedure Laterality Date  . CESAREAN SECTION    . CESAREAN SECTION  12/10/2011   Procedure: CESAREAN SECTION;  Surgeon: Mora Bellman, MD;  Location: [facility];  Service: Gynecology;  Laterality: N/A;  . CESAREAN SECTION WITH BILATERAL TUBAL LIGATION Bilateral 01/01/2015   Procedure: CESAREAN SECTION WITH BILATERAL TUBAL LIGATION;  Surgeon: Guss Bunde, MD;  Location: [facility];  Service: Obstetrics;  Laterality: Bilateral;  . TONSILLECTOMY    . WISDOM TOOTH EXTRACTION  2004    x4     OB History    Gravida  6   Para  3   Term  3   Preterm      AB  3   Living  3     SAB  2   TAB      Ectopic      Multiple  0   Live Births  3            Home Medications     Prior to Admission medications   Medication Sig Start Date End Date Taking? Authorizing Provider  ibuprofen (ADVIL,MOTRIN) 600 MG tablet Take 1 tablet (600 mg total) by mouth every 6 (six) hours as needed. 10/18/17   Noemi Chapel, MD  metroNIDAZOLE (FLAGYL) 500 MG tablet Take 1 tablet (500 mg total) by mouth 2 (two) times daily. 10/18/17   Noemi Chapel, MD  NORLYDA 0.35 MG tablet Take 1 tablet by mouth daily. 04/06/17   [provider]    Family History Family History  Problem Relation Age of Onset  . Diabetes Mother   . Lung cancer Paternal Aunt   . Breast cancer Paternal Grandmother 22  . Prostate cancer Paternal Grandfather     Social History Social History   Tobacco Use  . Smoking status: Never Smoker  . Smokeless tobacco: Never Used  Substance Use Topics  . Alcohol use: No  . Drug use: No     Allergies   Penicillins and Shellfish allergy   Review of Systems Review of Systems  Constitutional: Negative for fever.  Gastrointestinal: Positive for abdominal pain. Negative for nausea and vomiting.  Genitourinary: Positive for  pelvic pain. Negative for dysuria, vaginal bleeding, vaginal discharge and vaginal pain.  All other systems reviewed and are negative.    Physical Exam Updated Vital Signs BP 106/76   Pulse 82   Temp 98.8 F (37.1 C) (Oral)   Resp 14   LMP 10/18/2017   SpO2 100%   Physical Exam  Constitutional: She appears well-developed and well-nourished. No distress.  HENT:  Head: Normocephalic and atraumatic.  Mouth/Throat: Oropharynx is clear and moist. No oropharyngeal exudate.  Eyes: Pupils are equal, round, and reactive to light. Conjunctivae and EOM are normal. Right eye exhibits no discharge. Left eye exhibits no discharge. No scleral icterus.  Neck: Normal range of motion. Neck supple. No JVD present. No thyromegaly present.  Cardiovascular: Normal rate, regular rhythm, normal heart sounds and intact distal pulses. Exam reveals no  gallop and no friction rub.  No murmur heard. Pulmonary/Chest: Effort normal and breath sounds normal. No respiratory distress. She has no wheezes. She has no rales.  Abdominal: Soft. Bowel sounds are normal. She exhibits no distension and no mass. There is tenderness.  Tenderness to palpation in the suprapubic area as well as the left lower quadrant, no CVA tenderness.  Genitourinary:  Genitourinary Comments: Chaperone present All amount of vaginal discharge, no foul odor, no foreign body, no blood, cervix closed, bimanual exam with right adnexal tenderness, minimal left adnexal tenderness and no cervical motion tenderness.  Musculoskeletal: Normal range of motion. She exhibits no edema or tenderness.  Lymphadenopathy:    She has no cervical adenopathy.  Neurological: She is alert. Coordination normal.  Skin: Skin is warm and dry. No rash noted. No erythema.  Psychiatric: She has a normal mood and affect. Her behavior is normal.  Nursing note and vitals reviewed.    ED Treatments / Results  Labs (all labs ordered are listed, but only abnormal results are displayed) Labs Reviewed  WET PREP, GENITAL - Abnormal; Notable for the following components:      Result Value   Clue Cells Wet Prep HPF POC PRESENT (*)    WBC, Wet Prep HPF POC MANY (*)    All other components within normal limits  URINALYSIS, ROUTINE W REFLEX MICROSCOPIC - Abnormal; Notable for the following components:   APPearance HAZY (*)    Specific Gravity, Urine 1.031 (*)    All other components within normal limits  BASIC METABOLIC PANEL - Abnormal; Notable for the following components:   Glucose, Bld 101 (*)    All other components within normal limits  CBC  I-STAT BETA HCG BLOOD, ED ([facility], [facility], [facility] ONLY)  GC/CHLAMYDIA PROBE AMP ([place]) NOT AT [facility]    EKG None  Radiology US Pelvis Transvanginal Non-ob (tv Only)  Result Date: 10/18/2017 CLINICAL DATA:  Patient with lower abdominal/pelvic pain on the right.  EXAM: TRANSABDOMINAL AND TRANSVAGINAL ULTRASOUND OF PELVIS DOPPLER ULTRASOUND OF OVARIES TECHNIQUE: Both transabdominal and transvaginal ultrasound examinations of the pelvis were performed. Transabdominal technique was performed for global imaging of the pelvis including uterus, ovaries, adnexal regions, and pelvic cul-de-sac. It was necessary to proceed with endovaginal exam following the transabdominal exam to visualize the adnexal structures. Color and duplex Doppler ultrasound was utilized to evaluate blood flow to the ovaries. COMPARISON:  Pelvic ultrasound 12/25/2014 FINDINGS: Uterus Measurements: 9.9 x 5.8 x 6.1 cm. No fibroids or other mass visualized. Endometrium Thickness: 7 mm.  No focal abnormality visualized. Right ovary Measurements: 4.2 x 2.4 x 3.3 cm. There is a 2.5 x 1.8 x 2.3  cm cyst within the right ovary. Left ovary Measurements: 2.9 x 1.1 x 1.9 cm. Normal appearance/no adnexal mass. Pulsed Doppler evaluation of both ovaries demonstrates normal low-resistance arterial and venous waveforms. Other findings No abnormal free fluid. IMPRESSION: No acute process within the pelvis. No sonographic evidence to suggest torsion. Simple right ovarian cyst. Electronically Signed   By: Lovey Newcomer M.D.   On: 10/18/2017 09:45   US Pelvis (transabdominal Only)  Result Date: 10/18/2017 CLINICAL DATA:  Patient with lower abdominal/pelvic pain on the right. EXAM: TRANSABDOMINAL AND TRANSVAGINAL ULTRASOUND OF PELVIS DOPPLER ULTRASOUND OF OVARIES TECHNIQUE: Both transabdominal and transvaginal ultrasound examinations of the pelvis were performed. Transabdominal technique was performed for global imaging of the pelvis including uterus, ovaries, adnexal regions, and pelvic cul-de-sac. It was necessary to proceed with endovaginal exam following the transabdominal exam to visualize the adnexal structures. Color and duplex Doppler ultrasound was utilized to evaluate blood flow to the ovaries. COMPARISON:  Pelvic  ultrasound 12/25/2014 FINDINGS: Uterus Measurements: 9.9 x 5.8 x 6.1 cm. No fibroids or other mass visualized. Endometrium Thickness: 7 mm.  No focal abnormality visualized. Right ovary Measurements: 4.2 x 2.4 x 3.3 cm. There is a 2.5 x 1.8 x 2.3 cm cyst within the right ovary. Left ovary Measurements: 2.9 x 1.1 x 1.9 cm. Normal appearance/no adnexal mass. Pulsed Doppler evaluation of both ovaries demonstrates normal low-resistance arterial and venous waveforms. Other findings No abnormal free fluid. IMPRESSION: No acute process within the pelvis. No sonographic evidence to suggest torsion. Simple right ovarian cyst. Electronically Signed   By: Lovey Newcomer M.D.   On: 10/18/2017 09:45   Ct Abdomen Pelvis W Contrast  Result Date: 10/18/2017 CLINICAL DATA:  Right side groin pain, right flank pain EXAM: CT ABDOMEN AND PELVIS WITH CONTRAST TECHNIQUE: Multidetector CT imaging of the abdomen and pelvis was performed using the standard protocol following bolus administration of intravenous contrast. CONTRAST:  <See Chart> ISOVUE-300 IOPAMIDOL (ISOVUE-300) INJECTION 61% COMPARISON:  None. FINDINGS: Lower chest: Lung bases are clear. No effusions. Heart is normal size. Hepatobiliary: Insert fatty CT Pancreas: No focal abnormality or ductal dilatation. Spleen: No focal abnormality.  Normal size. Adrenals/Urinary Tract: No adrenal abnormality. No focal renal abnormality. Punctate nonobstructing stone in the lower pole of the left kidney. No hydronephrosis or ureteral stones. Urinary bladder unremarkable. Stomach/Bowel: Appendix is visualized and is normal. Stomach, large and small bowel grossly unremarkable. Vascular/Lymphatic: No evidence of aneurysm or adenopathy. Reproductive: Bilateral tubal ligation clips. Dominant follicle noted in the right ovary measuring 2.1 cm. Uterus and adnexa unremarkable. Other: Trace free fluid in the pelvis.  No free air. Musculoskeletal: No acute bony abnormality. IMPRESSION: Punctate  nonobstructing left lower pole renal stone. No ureteral stones or hydronephrosis. Fatty infiltration of the liver. Electronically Signed   By: Rolm Baptise M.D.   On: 10/18/2017 13:32   US Pelvic Doppler (torsion R/o Or Mass Arterial Flow)  Result Date: 10/18/2017 CLINICAL DATA:  Patient with lower abdominal/pelvic pain on the right. EXAM: TRANSABDOMINAL AND TRANSVAGINAL ULTRASOUND OF PELVIS DOPPLER ULTRASOUND OF OVARIES TECHNIQUE: Both transabdominal and transvaginal ultrasound examinations of the pelvis were performed. Transabdominal technique was performed for global imaging of the pelvis including uterus, ovaries, adnexal regions, and pelvic cul-de-sac. It was necessary to proceed with endovaginal exam following the transabdominal exam to visualize the adnexal structures. Color and duplex Doppler ultrasound was utilized to evaluate blood flow to the ovaries. COMPARISON:  Pelvic ultrasound 12/25/2014 FINDINGS: Uterus Measurements: 9.9 x 5.8 x 6.1 cm. No  fibroids or other mass visualized. Endometrium Thickness: 7 mm.  No focal abnormality visualized. Right ovary Measurements: 4.2 x 2.4 x 3.3 cm. There is a 2.5 x 1.8 x 2.3 cm cyst within the right ovary. Left ovary Measurements: 2.9 x 1.1 x 1.9 cm. Normal appearance/no adnexal mass. Pulsed Doppler evaluation of both ovaries demonstrates normal low-resistance arterial and venous waveforms. Other findings No abnormal free fluid. IMPRESSION: No acute process within the pelvis. No sonographic evidence to suggest torsion. Simple right ovarian cyst. Electronically Signed   By: Lovey Newcomer M.D.   On: 10/18/2017 09:45    Procedures Procedures (including critical care time)  Medications Ordered in ED Medications  fentaNYL (SUBLIMAZE) injection 50 mcg (50 mcg Intravenous Given 10/18/17 0904)  iopamidol (ISOVUE-300) 61 % injection (100 mLs  Contrast Given 10/18/17 1244)     Initial Impression / Assessment and Plan / ED Course  I have reviewed the triage vital  signs and the nursing notes.  Pertinent labs & imaging results that were available during my care of the patient were reviewed by me and considered in my medical decision making (see chart for details).    Concern raised for torsion given the patient's acute onset of adnexal tenderness and pain.  Will perform Doppler transvaginal ultrasound, may need CT scan if ultrasound negative to look for other sources such as possible appendicitis or kidney stone.  The patient otherwise has a nonsurgical abdomen.  Ultrasound negative, CT scan also negative, the patient is well-appearing at this time, she has good pain control, labs reviewed without any significant findings other than clue cells, no other signs of GC or chlamydia.  Patient given prescription for Flagyl, stable with ibuprofen for discharge  Final Clinical Impressions(s) / ED Diagnoses   Final diagnoses:  Abdominal pain, right lower quadrant    ED Discharge Orders        Ordered    ibuprofen (ADVIL,MOTRIN) 600 MG tablet  Every 6 hours PRN     10/18/17 1423    metroNIDAZOLE (FLAGYL) 500 MG tablet  2 times daily     10/18/17 1425       Noemi Chapel, MD 10/18/17 1425

## 2017-10-18 NOTE — Discharge Instructions (Addendum)
Ibuprofen for pain Drink plenty of fluids.

## 2017-10-18 NOTE — ED Triage Notes (Signed)
Pt states she woke up this am with right sided groin pain that going into right flank.

## 2017-10-19 ENCOUNTER — Other Ambulatory Visit: Payer: Self-pay | Admitting: Obstetrics & Gynecology

## 2017-10-19 ENCOUNTER — Telehealth: Payer: Self-pay | Admitting: Radiology

## 2017-10-19 MED ORDER — NORLYDA 0.35 MG PO TABS: 1.0000 | ORAL_TABLET | Freq: Every day | ORAL | 10 refills | Status: DC

## 2017-10-19 NOTE — Telephone Encounter (Signed)
See progress note.

## 2017-10-19 NOTE — Progress Notes (Signed)
32 yo female with RLQ pain.  Pt discovered that BTL was accomplished with Filshie clips in 2016.  Pt is concerned about foreign body and RLQ pain.   Pt has simple cyst in rt ovary c/w dominant follicle.  Pt was on OCPs in the past to control heavy periods.  She stopped these once bleeding was better.  Unsure of association with stopping OCPs, resuming ovulation, and RLQ pain.    Will restart OCPs and see if this helps with pain.  Pt to f/u in [place] in 8 weeks for evaluation of RLQ pain and  Possible removal of Filshie clips with partial salpingectomy.

## 2017-10-19 NOTE — Telephone Encounter (Signed)
Returned call to patient after leaving a message with call center over lunch hour. Patient states that she very upset. States that she was seen at the ED yesterday 10/19/17 due to abdominal/ pelvic pain. Explains that they preformed an ultrasound. Patient says that see saw and was informed that there were "clips" on her tubes from her BTL, (not a cause for the issue that she was there for) she explained that she is very upset because she wasn't aware that clips were going to be used, she was understanding that they would be cut and tied. Patient says that she specifily refused an IUD due to the fact that she did not want a "forgein object in her body for years". The BTL was preformed by Dr Silas [place] on 01/01/15. Patient is wanting to speak to someone about this matter. Information forwarded to Annia Belt, [place]

## 2017-10-20 NOTE — ED Notes (Signed)
10/20/2016. Pt. Called for results of her CT scan and explanation of the results.  Explained to her that she will need to have her PCP review her CT scan results with her.  She verbalized understanding and all questions answered.

## 2018-01-11 ENCOUNTER — Telehealth: Payer: Self-pay | Admitting: Obstetrics & Gynecology

## 2018-01-11 NOTE — Telephone Encounter (Signed)
Called to follow up with patient about pelvic pain.  OCPs were prescribed in March to help with dysmenorrhea.  She was to follow up with me at the [place] office.  She has not made an appointment yet.  Left message for her to call the office and make an appointment.

## 2019-04-21 IMAGING — CT CT ABD-PELV W/ CM
2 of 4 series · 16 of 46 positions shown, 18 images · IV contrast (Omni 300)
Comparison: None.

CLINICAL DATA: Right side groin pain, right flank pain

EXAM:
CT ABDOMEN AND PELVIS WITH CONTRAST
TECHNIQUE: Multidetector CT imaging of the abdomen and pelvis was performed
using the standard protocol following bolus administration of
intravenous contrast.
CONTRAST:  <See Chart> [id number] IOPAMIDOL ([id number]) INJECTION
61%

[Series 3: a/p w/ 5mm · axial · 0.79mm/px · z∈[+825,+1260]mm · 13 of 95 slices shown, 15 images]
[im 4/95  soft-tissue]
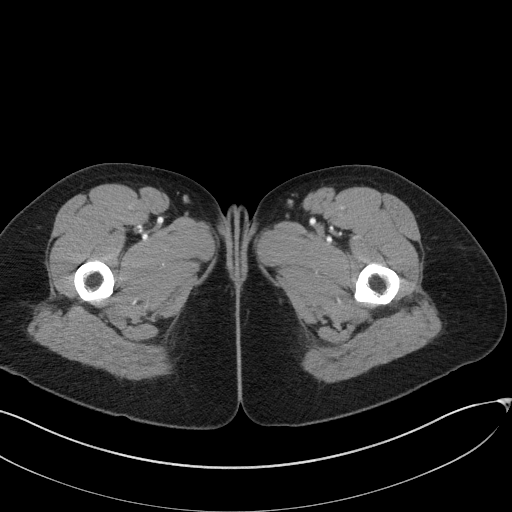
[im 4/95  bone]
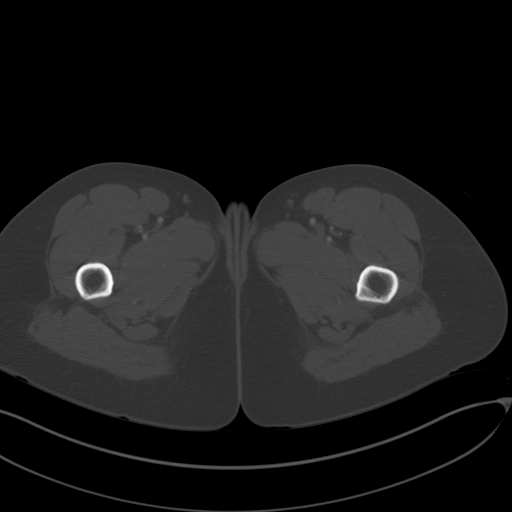
[im 11/95  soft-tissue]
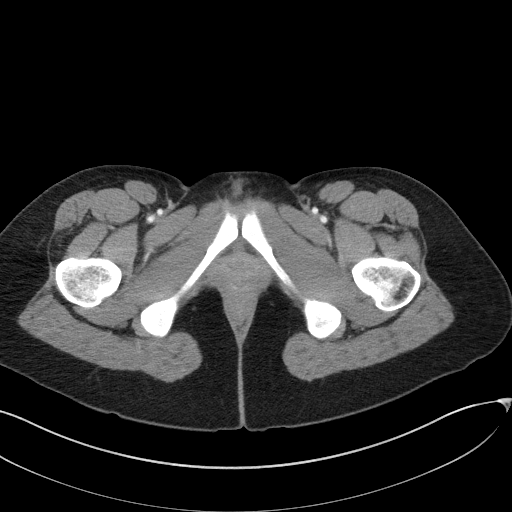
[im 19/95  soft-tissue]
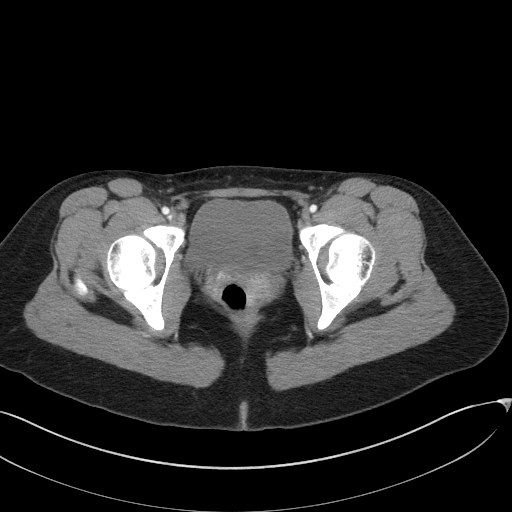
[im 26/95  soft-tissue]
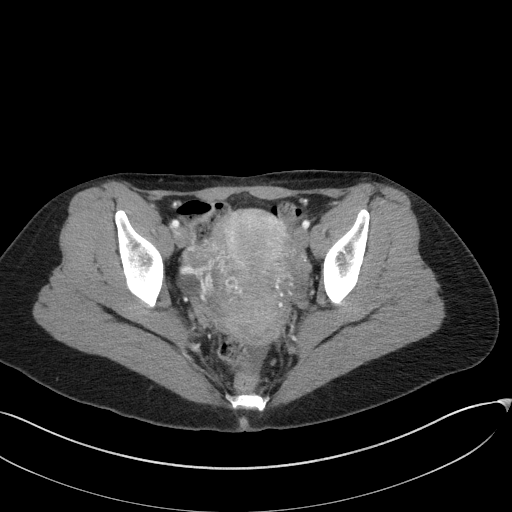
[im 33/95  soft-tissue]
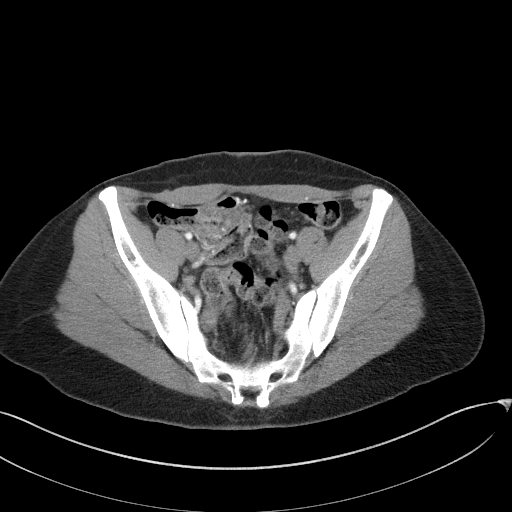
[im 40/95  soft-tissue]
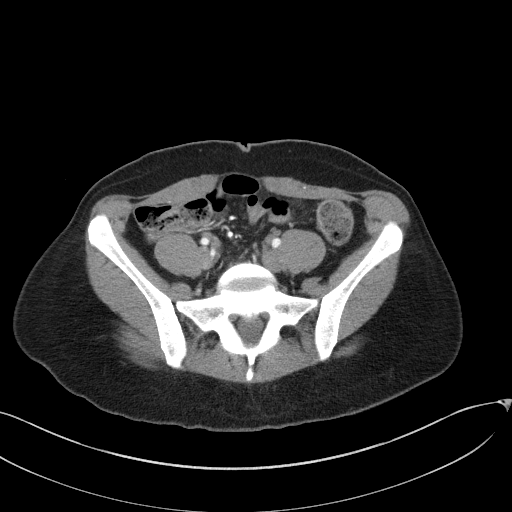
[im 48/95  soft-tissue]
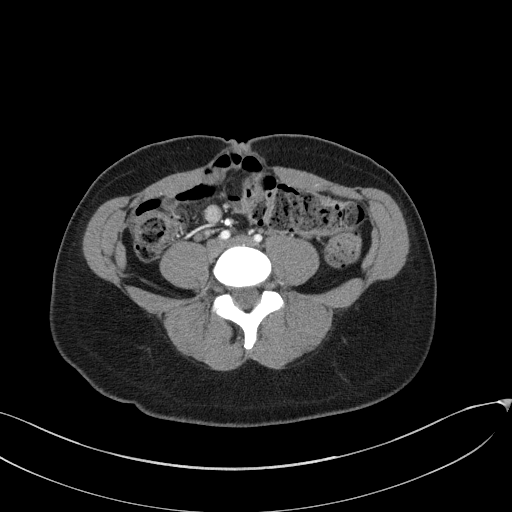
[im 55/95  soft-tissue]
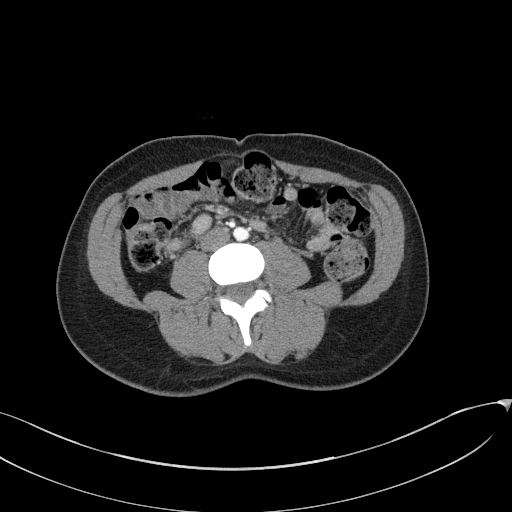
[im 62/95  soft-tissue]
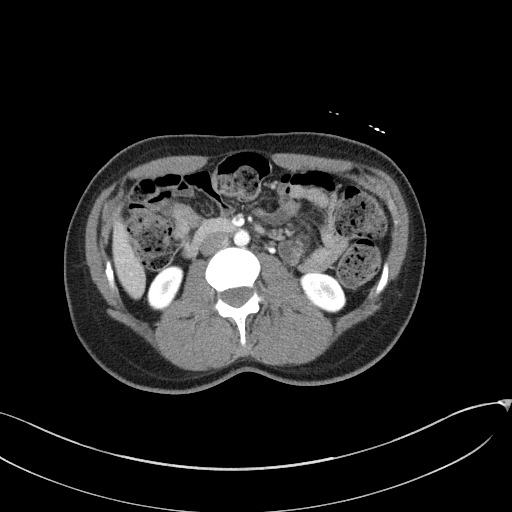
[im 62/95  bone]
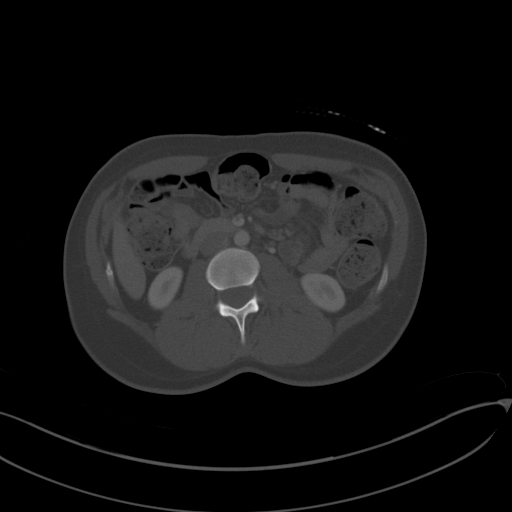
[im 69/95  soft-tissue]
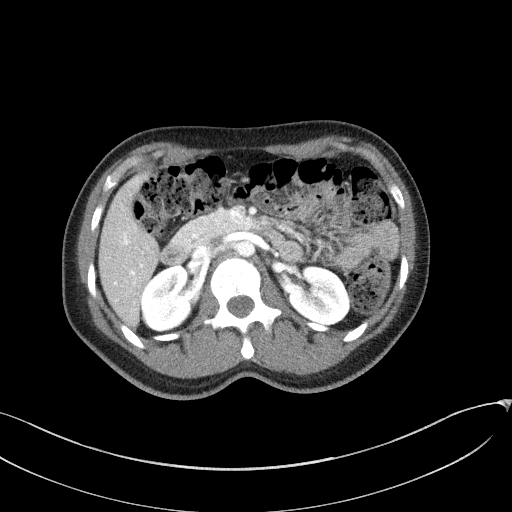
[im 76/95  soft-tissue]
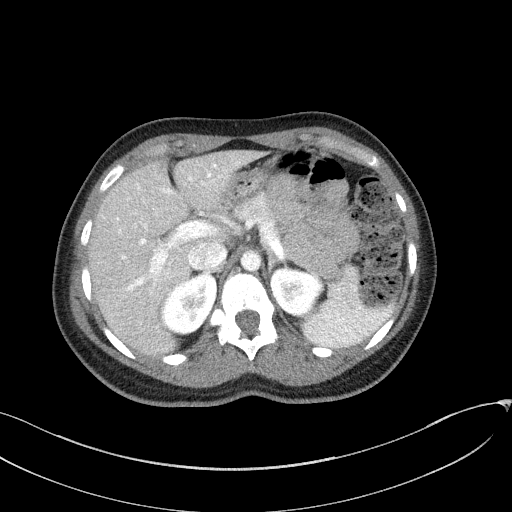
[im 84/95  soft-tissue]
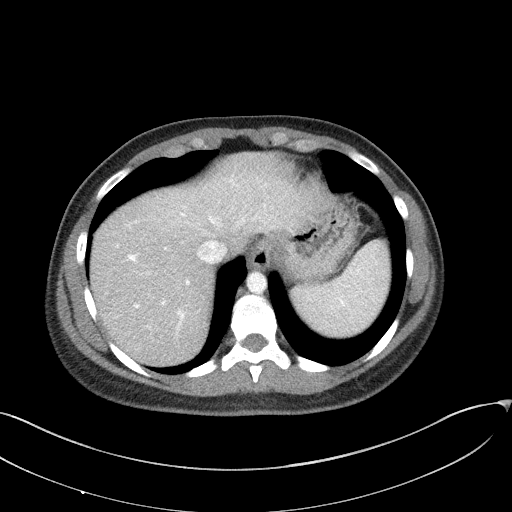
[im 91/95  soft-tissue]
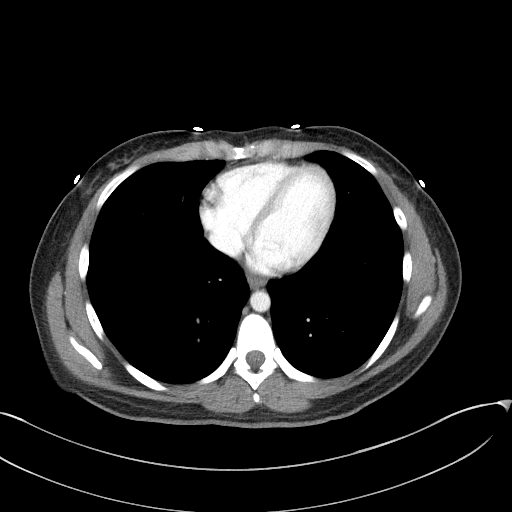

[Series 6: a/p w/ cor · coronal · 0.71mm/px · 3 of 130 slices shown]
[im 44/130  soft-tissue]
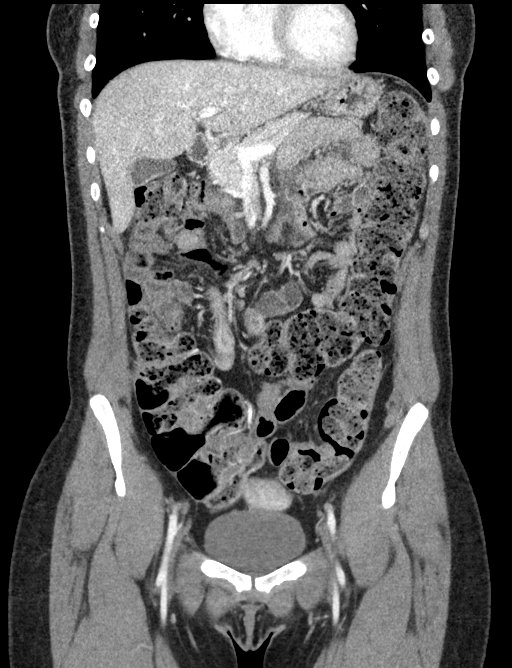
[im 58/130  soft-tissue]
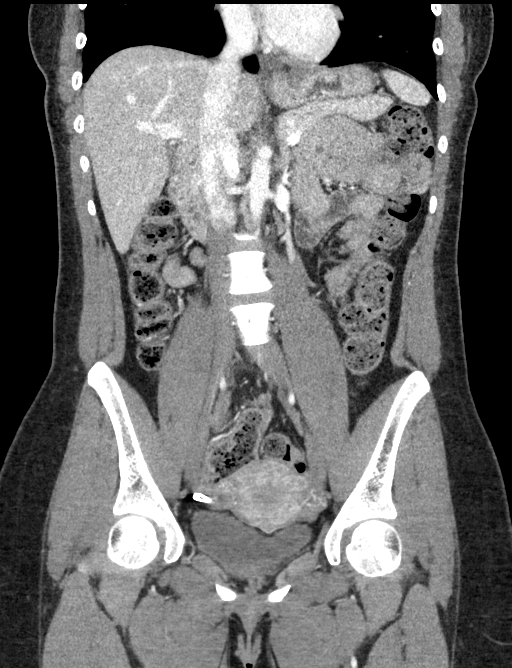
[im 72/130  soft-tissue]
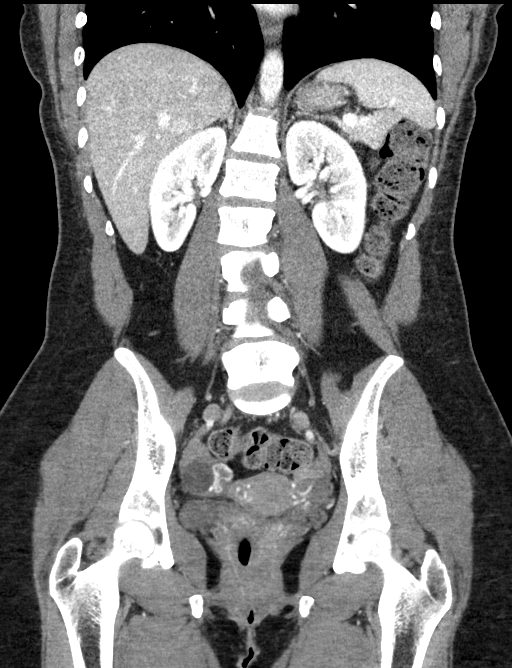

[16 of 46 positions shown; findings below may reference images not displayed]

FINDINGS: Lower chest: Lung bases are clear. No effusions. Heart is normal
size.

Hepatobiliary: Insert fatty CT

Pancreas: No focal abnormality or ductal dilatation.

Spleen: No focal abnormality.  Normal size.

Adrenals/Urinary Tract: No adrenal abnormality. No focal renal
abnormality. Punctate nonobstructing stone in the lower pole of the
left kidney. No hydronephrosis or ureteral stones. Urinary bladder
unremarkable.

Stomach/Bowel: Appendix is visualized and is normal. Stomach, large
and small bowel grossly unremarkable.

Vascular/Lymphatic: No evidence of aneurysm or adenopathy.

Reproductive: Bilateral tubal ligation clips. Dominant follicle
noted in the right ovary measuring 2.1 cm. Uterus and adnexa
unremarkable.

Other: Trace free fluid in the pelvis.  No free air.

Musculoskeletal: No acute bony abnormality.
IMPRESSION: Punctate nonobstructing left lower pole renal stone. No ureteral
stones or hydronephrosis.

Fatty infiltration of the liver.

## 2019-06-15 ENCOUNTER — Other Ambulatory Visit: Payer: Self-pay

## 2019-06-15 ENCOUNTER — Encounter (HOSPITAL_COMMUNITY): Payer: Self-pay | Admitting: Emergency Medicine

## 2019-06-15 ENCOUNTER — Ambulatory Visit (HOSPITAL_COMMUNITY)
Admission: EM | Admit: 2019-06-15 | Discharge: 2019-06-15 | Disposition: A | Discharge status: Home / Self Care | Payer: No Typology Code available for payment source

## 2019-06-15 DIAGNOSIS — M25532 Pain in left wrist: Secondary | ICD-10-CM | POA: Diagnosis not present

## 2019-06-15 DIAGNOSIS — M25531 Pain in right wrist: Secondary | ICD-10-CM | POA: Diagnosis not present

## 2019-06-15 NOTE — Discharge Instructions (Addendum)
This is most likely bad wrist sprain. You can rest, ice the areas and take ibuprofen for pain, inflammation and swelling. Purchase a wrist splint from drugstore this could help stabilize the right wrist. Follow up as needed for continued or worsening symptoms
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
## 2020-09-09 ENCOUNTER — Ambulatory Visit (INDEPENDENT_AMBULATORY_CARE_PROVIDER_SITE_OTHER): Payer: Medicaid Other | Admitting: Obstetrics and Gynecology

## 2020-09-09 ENCOUNTER — Encounter: Payer: Self-pay | Admitting: Obstetrics and Gynecology

## 2020-09-09 ENCOUNTER — Other Ambulatory Visit (HOSPITAL_COMMUNITY)
Admission: RE | Admit: 2020-09-09 | Discharge: 2020-09-09 | Disposition: A | Discharge status: Home / Self Care | Payer: Medicaid Other | Source: Ambulatory Visit | Attending: Obstetrics and Gynecology | Admitting: Obstetrics and Gynecology

## 2020-09-09 ENCOUNTER — Other Ambulatory Visit: Payer: Self-pay

## 2020-09-09 VITALS — BP 104/69 | HR 64 | Ht 67.0 in | Wt 151.0 lb

## 2020-09-09 DIAGNOSIS — N760 Acute vaginitis: Secondary | ICD-10-CM | POA: Diagnosis present

## 2020-09-09 DIAGNOSIS — Z01411 Encounter for gynecological examination (general) (routine) with abnormal findings: Secondary | ICD-10-CM

## 2020-09-09 NOTE — Progress Notes (Signed)
Pt states she has lingering spotting after cycle with slight smell, or she will have increase in dryness.  Pt states her cycles are normal.

## 2020-09-09 NOTE — Progress Notes (Signed)
35 yo P3 with BMI 23 and LMP 08/29/20 who is here for annual exam. Patient reports feeling well and is without complaints. She reports a monthly period lasting 5-6 days. She is not currently sexually active and has had BTL for contraception. Patient denies any pelvic pain or abnormal discharge. She reports a few additional days of spotting following her menses with an odor.  Past Medical History:  Diagnosis Date  . Abnormal Pap smear 2003  . Fibroid   . GERD (gastroesophageal reflux disease)    using tums   . Hernia 09-2011  . Migraine   . SAB (spontaneous abortion) 12/2010   TWO   Past Surgical History:  Procedure Laterality Date  . CESAREAN SECTION    . CESAREAN SECTION  12/10/2011   Procedure: CESAREAN SECTION;  Surgeon: Mora Bellman, MD;  Location: [facility];  Service: Gynecology;  Laterality: N/A;  . CESAREAN SECTION WITH BILATERAL TUBAL LIGATION Bilateral 01/01/2015   Procedure: CESAREAN SECTION WITH BILATERAL TUBAL LIGATION;  Surgeon: Guss Bunde, MD;  Location: [facility];  Service: Obstetrics;  Laterality: Bilateral;  . TONSILLECTOMY    . WISDOM TOOTH EXTRACTION  2004    x4   Family History  Problem Relation Age of Onset  . Diabetes Mother   . Lung cancer Paternal Aunt   . Breast cancer Paternal Grandmother 69  . Prostate cancer Paternal Grandfather    Social History   Tobacco Use  . Smoking status: Never Smoker  . Smokeless tobacco: Never Used  Substance Use Topics  . Alcohol use: No  . Drug use: No   ROS See pertinent in HPI. All other systems reviewed and non contributory  Blood pressure 104/69, pulse 64, height 5\' 7"  (1.702 m), weight 151 lb (68.5 kg), last menstrual period 08/29/2020, currently breastfeeding. GENERAL: Well-developed, well-nourished female in no acute distress.  HEENT: Normocephalic, atraumatic. Sclerae anicteric.  NECK: Supple. Normal thyroid.  LUNGS: Clear to auscultation bilaterally.  HEART: Regular rate and rhythm. BREASTS: Symmetric in  size. No palpable masses or lymphadenopathy, skin changes, or nipple drainage. ABDOMEN: Soft, nontender, nondistended. No organomegaly. PELVIC: Normal external female genitalia. Vagina is pink and rugated.  Normal discharge. Normal appearing cervix. Uterus is normal in size.  No adnexal mass or tenderness. EXTREMITIES: No cyanosis, clubbing, or edema, 2+ distal pulses.  A/P 35 yo P3 here for annual exam - pap smear collected - wet prep collected - patient will be contacted with abnormal results - Patient declined STI testing

## 2020-09-10 LAB — CERVICOVAGINAL ANCILLARY ONLY
Bacterial Vaginitis (gardnerella): POSITIVE — AB
Candida Glabrata: NEGATIVE
Candida Vaginitis: NEGATIVE
Comment: NEGATIVE
Comment: NEGATIVE
Comment: NEGATIVE

## 2020-09-10 MED ORDER — METRONIDAZOLE 500 MG PO TABS: 500.0000 mg | ORAL_TABLET | Freq: Two times a day (BID) | ORAL | 0 refills | Status: DC

## 2020-09-10 NOTE — Addendum Note (Signed)
Addended by: Mora Bellman on: 09/10/2020 03:15 PM   Modules accepted: Orders

## 2020-09-11 LAB — CYTOLOGY - PAP
Comment: NEGATIVE
Diagnosis: NEGATIVE
High risk HPV: NEGATIVE

## 2021-02-26 ENCOUNTER — Encounter: Payer: Self-pay | Admitting: Physician Assistant

## 2021-02-26 ENCOUNTER — Telehealth: Payer: Medicaid Other | Admitting: Physician Assistant

## 2021-02-26 DIAGNOSIS — B3731 Acute candidiasis of vulva and vagina: Secondary | ICD-10-CM

## 2021-02-26 DIAGNOSIS — B373 Candidiasis of vulva and vagina: Secondary | ICD-10-CM

## 2021-02-26 MED ORDER — FLUCONAZOLE 150 MG PO TABS: 150.0000 mg | ORAL_TABLET | Freq: Once | ORAL | 0 refills | Status: AC

## 2021-02-26 NOTE — Progress Notes (Signed)
Virtual Visit Consent   Frances Cohen, you are scheduled for a virtual visit with a [facility] provider today.     Just as with appointments in the office, your consent must be obtained to participate.  Your consent will be active for this visit and any virtual visit you may have with one of our providers in the next 365 days.     If you have a MyChart account, a copy of this consent can be sent to you electronically.  All virtual visits are billed to your insurance company just like a traditional visit in the office.    As this is a virtual visit, video technology does not allow for your provider to perform a traditional examination.  This may limit your provider's ability to fully assess your condition.  If your provider identifies any concerns that need to be evaluated in person or the need to arrange testing (such as labs, EKG, etc.), we will make arrangements to do so.     Although advances in technology are sophisticated, we cannot ensure that it will always work on either your end or our end.  If the connection with a video visit is poor, the visit may have to be switched to a telephone visit.  With either a video or telephone visit, we are not always able to ensure that we have a secure connection.     I need to obtain your verbal consent now.   Are you willing to proceed with your visit today?    [name] has provided verbal consent on 02/26/2021 for a virtual visit (video or telephone).   Frances Cohen, [place]   Date: 02/26/2021 1:50 PM   Virtual Visit via Video Note   I, Frances Cohen, connected with  Frances Cohen  ([id number], 05/27/1986) on 02/26/21 at  1:45 PM EDT by a video-enabled telemedicine application and verified that I am speaking with the correct person using two identifiers.  Location: Patient: Virtual Visit Location Patient: Home Provider: Virtual Visit Location Provider: Home Office   I discussed the limitations of evaluation and management  by telemedicine and the availability of in person appointments. The patient expressed understanding and agreed to proceed.    History of Present Illness: Frances Cohen is a 35 y.o. who identifies as a female who was assigned female at birth, and is being seen today for vaginal pruritus x ~ 1 week. Associated with a white/yellow discharge but only noted after use of OTC monistat with only some slight improvement in other symptoms. Also made sure to stop her Summer's Eve. Denies vaginal odor. LMP ended 02/18/2021. Nothing out of the ordinary with this current menstrual period. Denies concern for STI.   HPI: HPI  Problems:  Patient Active Problem List   Diagnosis Date Noted   Femoral hernia 10/13/2011    Allergies:  Allergies  Allergen Reactions   Penicillins Anaphylaxis and Swelling   Shellfish Allergy Hives and Other (See Comments)    Gets a tight itchy throat.   Medications:  Current Outpatient Medications:    fluconazole (DIFLUCAN) 150 MG tablet, Take 1 tablet (150 mg total) by mouth once for 1 dose., Disp: 2 tablet, Rfl: 0  Observations/Objective: Patient is well-developed, well-nourished in no acute distress.  Resting comfortably at home.  Head is normocephalic, atraumatic.  No labored breathing. Speech is clear and coherent with logical content.  Patient is alert and oriented at baseline.   Assessment and Plan: 1. Yeast vaginitis -  fluconazole (DIFLUCAN) 150 MG tablet; Take 1 tablet (150 mg total) by mouth once for 1 dose.  Dispense: 2 tablet; Refill: 0 Most likely culprit giving itch as main symptom and no external rash to indicate an allergic dermatitis. Rx Diflucan to take as directed, repeating in 3 days if needed. Supportive measures and proper cleaning of perineum reviewed. Will need in-person evaluation for any non-resolving, new or worsening symptoms.  Follow Up Instructions: I discussed the assessment and treatment plan with the patient. The patient was provided an  opportunity to ask questions and all were answered. The patient agreed with the plan and demonstrated an understanding of the instructions.  A copy of instructions were sent to the patient via MyChart.  The patient was advised to call back or seek an in-person evaluation if the symptoms worsen or if the condition fails to improve as anticipated.  Time:  I spent 12 minutes with the patient via telehealth technology discussing the above problems/concerns.    Frances Rio, PA-C

## 2021-02-26 NOTE — Patient Instructions (Signed)
  Cipriano Bunker, thank you for joining [name], PA-C for today's virtual visit.  While this provider is not your primary care provider (PCP), if your PCP is located in our provider database this encounter information will be shared with them immediately following your visit.  Consent: (Patient) Cipriano Bunker provided verbal consent for this virtual visit at the beginning of the encounter.  Current Medications:  Current Outpatient Medications:    metroNIDAZOLE (FLAGYL) 500 MG tablet, Take 1 tablet (500 mg total) by mouth 2 (two) times daily., Disp: 14 tablet, Rfl: 0   NORLYDA 0.35 MG tablet, Take 1 tablet (0.35 mg total) by mouth daily., Disp: 1 Package, Rfl: 10   Medications ordered in this encounter:  No orders of the defined types were placed in this encounter.    *If you need refills on other medications prior to your next appointment, please contact your pharmacy*  Follow-Up: Call back or seek an in-person evaluation if the symptoms worsen or if the condition fails to improve as anticipated.  Other Instructions Please use the Diflucan as directed. Avoid using any scented or dyed soaps/lotions to clean the external or internal vagina. Consider use of Cetaphil or Dove to clean external structures (labia majora, surrounding skin). You really only need a small amount of soap and lots of clean water.   If symptoms are not resolving, you need an in-person evaluation with your primary care provider or gynecologist.    If you have been instructed to have an in-person evaluation today at a local Urgent Care facility, please use the link below. It will take you to a list of all of our available [facility], including address, phone number and hours of operation. Please do not delay care.  [facility]  If you or a family member do not have a primary care provider, use the link below to schedule a visit and establish care. When you choose a [facility] primary care physician or advanced practice provider, you gain a long-term partner in health. Find a Primary Care Provider  Learn more about [facility] in-office and virtual care options: [facility]

## 2021-03-07 ENCOUNTER — Encounter: Payer: Self-pay | Admitting: Nurse Practitioner

## 2021-03-07 ENCOUNTER — Telehealth: Payer: Medicaid Other | Admitting: Nurse Practitioner

## 2021-03-07 DIAGNOSIS — N760 Acute vaginitis: Secondary | ICD-10-CM

## 2021-03-07 DIAGNOSIS — B9689 Other specified bacterial agents as the cause of diseases classified elsewhere: Secondary | ICD-10-CM

## 2021-03-07 MED ORDER — METRONIDAZOLE 500 MG PO TABS: 500.0000 mg | ORAL_TABLET | Freq: Two times a day (BID) | ORAL | 0 refills | Status: AC

## 2021-03-07 NOTE — Progress Notes (Signed)
Virtual Visit Consent   Frances Cohen, you are scheduled for a virtual visit with a [facility] provider today.     Just as with appointments in the office, your consent must be obtained to participate.  Your consent will be active for this visit and any virtual visit you may have with one of our providers in the next 365 days.     If you have a MyChart account, a copy of this consent can be sent to you electronically.  All virtual visits are billed to your insurance company just like a traditional visit in the office.    As this is a virtual visit, video technology does not allow for your provider to perform a traditional examination.  This may limit your provider's ability to fully assess your condition.  If your provider identifies any concerns that need to be evaluated in person or the need to arrange testing (such as labs, EKG, etc.), we will make arrangements to do so.     Although advances in technology are sophisticated, we cannot ensure that it will always work on either your end or our end.  If the connection with a video visit is poor, the visit may have to be switched to a telephone visit.  With either a video or telephone visit, we are not always able to ensure that we have a secure connection.     I need to obtain your verbal consent now.   Are you willing to proceed with your visit today?    Frances Cohen has provided verbal consent on 03/07/2021 for a virtual visit (video or telephone).   Frances Schneiders, Frances Cohen   Date: 03/07/2021 2:33 PM   Virtual Visit via Video Note   I, Frances Cohen, connected with  Frances Cohen  ([id number], 03/19/1986) on 03/07/21 at  2:30 PM EDT by a video-enabled telemedicine application and verified that I am speaking with the correct person using two identifiers.  Location: Patient: Virtual Visit Location Patient: Home Provider: Virtual Visit Location Provider: Office/Clinic   I discussed the limitations of evaluation and management by  telemedicine and the availability of in person appointments. The patient expressed understanding and agreed to proceed.    History of Present Illness: Frances Cohen is a 35 y.o. who identifies as a female who was assigned female at birth, and is being seen today with complaints of vaginal discharge. The discharge is thin and increases at night.   She is married and has had this problem in the past, denies risk for STI  She was treated for a yeast infection one week ago, feel as though symptoms improved but did not resolve. Denies odor.   Took diflucan twice, 72 hours a part.   Denies any new partners but these symptoms did start after sexual intercourse after not having sex for 3 months. She had a similar symptoms in February resolved with BV treatment    Problems:  Patient Active Problem List   Diagnosis Date Noted   Femoral hernia 10/13/2011    Allergies:  Allergies  Allergen Reactions   Penicillins Anaphylaxis and Swelling   Shellfish Allergy Hives and Other (See Comments)    Gets a tight itchy throat.   Medications: No current outpatient medications on file.  Observations/Objective: Patient is well-developed, well-nourished in no acute distress.  Resting comfortably at home.  Head is normocephalic, atraumatic.  No labored breathing.  Speech is clear and coherent with logical content.  Patient is alert and oriented  at baseline.    Assessment and Plan: 1. BV (bacterial vaginosis)  - metroNIDAZOLE (FLAGYL) 500 MG tablet; Take 1 tablet (500 mg total) by mouth 2 (two) times daily for 7 days.  Dispense: 14 tablet; Refill: 0     Follow Up Instructions: I discussed the assessment and treatment plan with the patient. The patient was provided an opportunity to ask questions and all were answered. The patient agreed with the plan and demonstrated an understanding of the instructions.  A copy of instructions were sent to the patient via MyChart.  The patient was advised to  call back or seek an in-person evaluation if the symptoms worsen or if the condition fails to improve as anticipated.  Time:  I spent 15 minutes with the patient via telehealth technology discussing the above problems/concerns.    Frances Schneiders, Frances Cohen

## 2021-06-27 ENCOUNTER — Ambulatory Visit: Payer: Medicaid Other

## 2021-06-28 ENCOUNTER — Inpatient Hospital Stay (HOSPITAL_COMMUNITY)
Admission: AD | Admit: 2021-06-28 | Discharge: 2021-06-28 | Disposition: A | Discharge status: Home / Self Care | Payer: Managed Care, Other (non HMO) | Attending: Obstetrics & Gynecology | Admitting: Obstetrics & Gynecology

## 2021-06-28 ENCOUNTER — Other Ambulatory Visit: Payer: Self-pay

## 2021-06-28 DIAGNOSIS — Z3202 Encounter for pregnancy test, result negative: Secondary | ICD-10-CM | POA: Insufficient documentation

## 2021-06-28 DIAGNOSIS — R102 Pelvic and perineal pain: Secondary | ICD-10-CM | POA: Insufficient documentation

## 2021-06-28 DIAGNOSIS — R109 Unspecified abdominal pain: Secondary | ICD-10-CM | POA: Diagnosis present

## 2021-06-28 DIAGNOSIS — Z789 Other specified health status: Secondary | ICD-10-CM

## 2021-06-28 LAB — HCG, QUANTITATIVE, PREGNANCY: hCG, Beta Chain, Quant, S: <1 m[IU]/mL (ref <5)

## 2021-06-28 NOTE — [facility] Provider Note (Signed)
[name] is a 35 y.o. [phone] non-pregnant female who presents to [facility]U today with complaint of mid-abdominal pain that radiates down into her right leg and into her lower left pelvic area. Had a positive pregnancy test two days ago, also have breast tenderness - both the abdominal pain and breast tenderness were classic pregnancy signs for her. Had a BTL six years ago. LMP 06/01/21. No other physical complaints.  Receives gynecologic care at [facility].  Pertinent items noted in HPI and remainder of comprehensive ROS otherwise negative.   O BP 97/77 (BP Location: Right Arm)   Pulse 86   Temp 98 F (36.7 C) (Oral)   Resp 18   Ht 5\' 7"  (1.702 m)   Wt 152 lb 1.6 oz (69 kg)   LMP 06/01/2021   SpO2 100%   BMI 23.82 kg/m  Physical Exam Vitals and nursing note reviewed.  Constitutional:      Appearance: She is well-developed.  HENT:     Head: Normocephalic.  Eyes:     Pupils: Pupils are equal, round, and reactive to light.  Cardiovascular:     Rate and Rhythm: Normal rate and regular rhythm.  Pulmonary:     Effort: Pulmonary effort is normal.  Abdominal:     General: Abdomen is flat.     Palpations: Abdomen is soft.     Tenderness: There is no abdominal tenderness.  Musculoskeletal:        General: Normal range of motion.  Skin:    General: Skin is warm.     Capillary Refill: Capillary refill takes less than 2 seconds.  Neurological:     Mental Status: She is alert and oriented to person, place, and time.  Psychiatric:        Mood and Affect: Mood normal.        Behavior: Behavior normal.        Thought Content: Thought content normal.        Judgment: Judgment normal.   Results for orders placed or performed during the hospital encounter of 06/28/21 (from the past 24 hour(s))  hCG, quantitative, pregnancy     Status: None   Collection Time: 06/28/21  3:31 PM  Result Value Ref Range   hCG, Beta Chain, Quant, S <1 <5 mIU/mL   A Not currently pregnant - pt  given results and explained that false positives of home tests are common. Urged to seek care if cramping does not ease with onset of period. Mid-abdominal pain Medical screening exam complete  P Discharge from MAU in stable condition Can present to urgent care, her PCP, or make appointment at [facility] for follow up  Gabriel Carina, CNM 06/28/2021 4:26 PM

## 2021-06-28 NOTE — MAU Note (Signed)
Presents with c/o abdominal pain that began today.  Reports pain is located mid abdomen and rotates to both side and radiates into right leg.  Denies VB.  Reports s/p BTL 2016, but had +HPT yesterday.

## 2021-07-02 ENCOUNTER — Ambulatory Visit: Payer: Medicaid Other

## 2021-08-12 ENCOUNTER — Ambulatory Visit: Payer: Managed Care, Other (non HMO) | Admitting: Obstetrics

## 2021-08-14 ENCOUNTER — Ambulatory Visit (INDEPENDENT_AMBULATORY_CARE_PROVIDER_SITE_OTHER): Payer: Managed Care, Other (non HMO)

## 2021-08-14 ENCOUNTER — Other Ambulatory Visit (HOSPITAL_COMMUNITY)
Admission: RE | Admit: 2021-08-14 | Discharge: 2021-08-14 | Disposition: A | Discharge status: Home / Self Care | Payer: Managed Care, Other (non HMO) | Source: Ambulatory Visit | Attending: Obstetrics | Admitting: Obstetrics

## 2021-08-14 VITALS — BP 109/74 | HR 64 | Ht 67.0 in | Wt 155.0 lb

## 2021-08-14 DIAGNOSIS — N946 Dysmenorrhea, unspecified: Secondary | ICD-10-CM | POA: Insufficient documentation

## 2021-08-14 NOTE — Progress Notes (Addendum)
GYN presents for heavy periods in December and January with blood clots, cramps 7/10 x 3 days each month.  Home UPT in Dec. POSITIVE but at Hospital was NEGATIVE.  Patient had BTS in 2016  Last PAP 09/09/2020

## 2021-08-14 NOTE — Progress Notes (Signed)
GYNECOLOGY PROBLEM OFFICE VISIT NOTE  History:  Frances Cohen is a 36 y.o. [phone] here today for concerns with her cycles. She reports in January and December she had 3 day cycle that was "all clots."  She reports in January the clots were about the size of ping-pong balls and larger than December.  She reports the blood, in the toilet, was very dark and "looked like ash."  She reports around November she started "feeling pregnant" and had a positive UPT at the beginning of Dec.  She states she had abdominal pain and had a negative UPT at [facility].  She reports having "phantom kicks" in mid December.  She denies any abnormal vaginal discharge, bleeding, pelvic pain or pain discomfort with sex or urination.  She reports usual periods is 5 days and is light to heavy without clots.  She reports no cramping. However, she reports she has been experiencing cramping. LMP Jan 3rd.    Past Medical History:  Diagnosis Date   Abnormal Pap smear 2003   Fibroid    GERD (gastroesophageal reflux disease)    using tums    Hernia 09-2011   Migraine    SAB (spontaneous abortion) 12/2010   TWO    Past Surgical History:  Procedure Laterality Date   CESAREAN SECTION     CESAREAN SECTION  12/10/2011   Procedure: CESAREAN SECTION;  Surgeon: Mora Bellman, MD;  Location: [facility];  Service: Gynecology;  Laterality: N/A;   CESAREAN SECTION WITH BILATERAL TUBAL LIGATION Bilateral 01/01/2015   Procedure: CESAREAN SECTION WITH BILATERAL TUBAL LIGATION;  Surgeon: Guss Bunde, MD;  Location: [facility];  Service: Obstetrics;  Laterality: Bilateral;   TONSILLECTOMY     WISDOM TOOTH EXTRACTION  2004    x4    The following portions of the patient's history were reviewed and updated as appropriate: allergies, current medications, past family history, past medical history, past social history, past surgical history and problem list.   Health Maintenance:  Normal pap and negative HRHPV on Feb 2022.  No mammogram d/t age.    Review of Systems:  Genito-Urinary ROS: no dysuria, trouble voiding, or hematuria Gastrointestinal ROS: no abdominal pain, change in bowel habits, or black or bloody stools Objective:  Vitals: BP 109/74    Pulse 64    Ht 5\' 7"  (1.702 m)    Wt 155 lb (70.3 kg)    LMP 07/29/2021 (Exact Date)    BMI 24.28 kg/m   Physical Exam: Physical Exam Constitutional:      Appearance: Normal appearance.  HENT:     Head: Normocephalic and atraumatic.  Eyes:     Conjunctiva/sclera: Conjunctivae normal.  Cardiovascular:     Rate and Rhythm: Normal rate and regular rhythm.     Heart sounds: Normal heart sounds.  Pulmonary:     Effort: Pulmonary effort is normal. No respiratory distress.  Abdominal:     General: Bowel sounds are normal.  Musculoskeletal:        General: Normal range of motion.     Cervical back: Normal range of motion.  Neurological:     Mental Status: She is alert and oriented to person, place, and time.  Skin:    General: Skin is warm and dry.  Psychiatric:        Mood and Affect: Mood normal.        Behavior: Behavior normal.        Thought Content: Thought content normal.  Vitals reviewed. Exam conducted with  a chaperone present.     Labs and Imaging: No results found.  Assessment & Plan:  36 year old Dysmenorrhea  -Reviewed complaint/concerns regarding changes in menstruation.  -Discussed potential causes including infection, fibroids, and new pattern of bleeding s/t increasing age. -Reviewed potential POC to include STD testing, ultrasound, and/or further evaluation with MD.  -Established plan to complete STD/I testing and pelvic ultrasound. -Will also send script for ibuprofen for associated pain. -Encouraged to continue to monitor symptoms and report new. -Will develop plan as results return. -Patient agreeable and without current questions. -Encouraged to communicate via mychart for any questions/concerns, reviewing of results, and scheduling of appts.    Total face-to-face time with patient: 15 minutes   Gavin Pound, [place] 08/14/2021 4:04 PM

## 2021-08-15 LAB — CERVICOVAGINAL ANCILLARY ONLY
Bacterial Vaginitis (gardnerella): NEGATIVE
Candida Glabrata: NEGATIVE
Candida Vaginitis: NEGATIVE
Chlamydia: NEGATIVE
Comment: NEGATIVE
Comment: NEGATIVE
Comment: NEGATIVE
Comment: NEGATIVE
Comment: NEGATIVE
Comment: NORMAL
Neisseria Gonorrhea: NEGATIVE
Trichomonas: NEGATIVE

## 2021-08-15 MED ORDER — IBUPROFEN 800 MG PO TABS: 800.0000 mg | ORAL_TABLET | Freq: Three times a day (TID) | ORAL | 0 refills | Status: DC | PRN

## 2021-08-21 ENCOUNTER — Other Ambulatory Visit: Payer: Self-pay

## 2021-08-21 ENCOUNTER — Telehealth: Payer: Self-pay

## 2021-08-21 ENCOUNTER — Ambulatory Visit (HOSPITAL_BASED_OUTPATIENT_CLINIC_OR_DEPARTMENT_OTHER)
Admission: RE | Admit: 2021-08-21 | Discharge: 2021-08-21 | Disposition: A | Discharge status: Home / Self Care | Payer: Managed Care, Other (non HMO) | Source: Ambulatory Visit

## 2021-08-21 DIAGNOSIS — N946 Dysmenorrhea, unspecified: Secondary | ICD-10-CM | POA: Diagnosis not present

## 2021-08-21 NOTE — Telephone Encounter (Signed)
Frances Cohen  February 04, 1986 [id number]   Patient called and agreeable to results via phone and was informed of normal US findings.  Discussed bleeding and patient reports she has not had bleeding since Jan 3rd.  She does Patient reports some cramping in lower back and pelvic area that lasts about one minute, but occurs at least twice daily. Informed that provider will follow up in ~ 2 weeks to assess bleeding during next menstrual cycle.  Patient agreeable and expresses gratitude.  Patient questions if Filshie clips remain in place and informed that this is not routinely verified on ultrasound. Patient verbalizes understanding.  Patient also questions if stress could contribute to her modifications in her bleeding pattern.  Reassured that stress can cause change in menstrual cycle including flow, duration, and length. Encouraged to call or send mychart message if any should arise.  Maryann Conners MSN, CNM Advanced Practice Provider, [facility]  **This visit was completed, in its entirety, via telehealth communications.  I personally spent >/=4 minutes on the phone providing recommendations, education, and guidance.**

## 2022-09-15 DIAGNOSIS — N926 Irregular menstruation, unspecified: Secondary | ICD-10-CM | POA: Diagnosis not present

## 2022-09-15 DIAGNOSIS — N920 Excessive and frequent menstruation with regular cycle: Secondary | ICD-10-CM | POA: Diagnosis not present

## 2022-09-15 DIAGNOSIS — N760 Acute vaginitis: Secondary | ICD-10-CM | POA: Diagnosis not present

## 2022-09-15 DIAGNOSIS — Z113 Encounter for screening for infections with a predominantly sexual mode of transmission: Secondary | ICD-10-CM | POA: Diagnosis not present

## 2022-09-15 DIAGNOSIS — Z3202 Encounter for pregnancy test, result negative: Secondary | ICD-10-CM | POA: Diagnosis not present

## 2022-09-16 DIAGNOSIS — N920 Excessive and frequent menstruation with regular cycle: Secondary | ICD-10-CM | POA: Diagnosis not present

## 2023-06-07 ENCOUNTER — Ambulatory Visit: Payer: Managed Care, Other (non HMO) | Admitting: Obstetrics and Gynecology

## 2023-07-08 ENCOUNTER — Other Ambulatory Visit (HOSPITAL_COMMUNITY)
Admission: RE | Admit: 2023-07-08 | Discharge: 2023-07-08 | Disposition: A | Discharge status: Home / Self Care | Payer: Medicaid Other | Source: Ambulatory Visit | Attending: Obstetrics and Gynecology | Admitting: Obstetrics and Gynecology

## 2023-07-08 ENCOUNTER — Ambulatory Visit: Payer: Medicaid Other | Admitting: Obstetrics and Gynecology

## 2023-07-08 VITALS — BP 123/76 | HR 69 | Ht 67.0 in | Wt 141.1 lb

## 2023-07-08 DIAGNOSIS — R350 Frequency of micturition: Secondary | ICD-10-CM | POA: Diagnosis present

## 2023-07-08 LAB — POCT URINALYSIS DIPSTICK
Bilirubin, UA: NEGATIVE
Glucose, UA: NEGATIVE
Leukocytes, UA: NEGATIVE
Nitrite, UA: NEGATIVE
Protein, UA: POSITIVE — AB
Spec Grav, UA: 1.015 (ref 1.010–1.025)
Urobilinogen, UA: 0.2 U/dL
pH, UA: 6 (ref 5.0–8.0)

## 2023-07-08 NOTE — Patient Instructions (Signed)
It was nice meeting you today! You should get your test results back in the MyChart app within a week. I will let you know if you need any medicines for UTI.

## 2023-07-08 NOTE — Progress Notes (Signed)
   RETURN GYNECOLOGY VISIT  Subjective:  Frances Cohen is a 37 y.o. [phone] with LMP 06/15/23 presenting for urinary frequency, odor and pregnancy test  Reports 1 week of urinary frequency and 1 day of abdominal pressure and odor. Denies fevers, chills, n/v, dysuria, constipation, diarrhea, or vaginal discharge. Has been drinking around 2 cups of water per day, normally drinks more. Has started exercising in the past couple of weeks.   Hx BTL  Objective:   Vitals:   07/08/23 1106  BP: 123/76  Pulse: 69  Weight: 141 lb 1.6 oz (64 kg)  Height: 5\' 7"  (1.702 m)   General:  Alert, oriented and cooperative. Patient is in no acute distress.  Skin: Skin is warm and dry. No rash noted.   Cardiovascular: Normal heart rate noted  Respiratory: Normal respiratory effort, no problems with respiration noted  Abdomen: Soft, non-tender, non-distended   Pelvic: NEFG. No LT/OT tenderness. Uterus small mobile nontender. No adnexal masses or tenderness. No suprapubic or urethral tenderness  Exam performed in the presence of a chaperone  Assessment and Plan:  Frances Cohen is a 37 y.o. with urinary frequency  1. Urinary frequency (Primary) UA negative for LE/nitrite, ketones, or glucose - will send for culture UPT negative Discussed potential role for bladder training if Ucx is normal Offered STI screening, declines at this time - Cervicovaginal ancillary only( [place]) - POCT Urinalysis Dipstick - Urine Culture  Return if symptoms worsen or fail to improve.  Future Appointments  Date Time Provider Department Center  07/12/2023 10:20 AM Alfredia Ferguson, PA-C [facility]   Lennart Pall, MD

## 2023-07-08 NOTE — Progress Notes (Signed)
Neg preg.

## 2023-07-09 LAB — CERVICOVAGINAL ANCILLARY ONLY
Bacterial Vaginitis (gardnerella): POSITIVE — AB
Candida Glabrata: NEGATIVE
Candida Vaginitis: NEGATIVE
Comment: NEGATIVE
Comment: NEGATIVE
Comment: NEGATIVE

## 2023-07-09 MED ORDER — METRONIDAZOLE 0.75 % VA GEL: 1.0000 | Freq: Every day | VAGINAL | 1 refills | Status: DC

## 2023-07-09 NOTE — Addendum Note (Signed)
Addended by: Harvie Bridge on: 07/09/2023 09:58 PM   Modules accepted: Orders

## 2023-07-09 NOTE — Progress Notes (Unsigned)
New patient visit   Patient: Frances Cohen   DOB: 1985-10-17   37 y.o. Female  MRN: [id number] Visit Date: 07/12/2023  Today's healthcare provider: Alfredia Ferguson, PA-C   No chief complaint on file.  Subjective    Frances Cohen is a 37 y.o. female who presents today as a new patient to establish care.   Breastfeeding?  Past Medical History:  Diagnosis Date   Abnormal Pap smear 2003   Fibroid    GERD (gastroesophageal reflux disease)    using tums    Hernia 09-2011   Migraine    SAB (spontaneous abortion) 12/2010   TWO   Past Surgical History:  Procedure Laterality Date   CESAREAN SECTION     CESAREAN SECTION  12/10/2011   Procedure: CESAREAN SECTION;  Surgeon: Catalina Antigua, MD;  Location: [facility];  Service: Gynecology;  Laterality: N/A;   CESAREAN SECTION WITH BILATERAL TUBAL LIGATION Bilateral 01/01/2015   Procedure: CESAREAN SECTION WITH BILATERAL TUBAL LIGATION;  Surgeon: Lesly Dukes, MD;  Location: [facility];  Service: Obstetrics;  Laterality: Bilateral;   TONSILLECTOMY     WISDOM TOOTH EXTRACTION  2004    x4   Family Status  Relation Name Status   Mother  Alive   Dennie Bible Aunt  Deceased   PGM  Deceased   PGF  Deceased  No partnership data on file   Family History  Problem Relation Age of Onset   Diabetes Mother    Lung cancer Paternal Aunt    Breast cancer Paternal Grandmother 47   Prostate cancer Paternal Grandfather    Social History   Socioeconomic History   Marital status: Significant Other    Spouse name: Not on file   Number of children: Not on file   Years of education: Not on file   Highest education level: Not on file  Occupational History   Not on file  Tobacco Use   Smoking status: Never   Smokeless tobacco: Never  Substance and Sexual Activity   Alcohol use: No   Drug use: No   Sexual activity: Yes    Partners: Male    Birth control/protection: Surgical  Other Topics Concern   Not on file  Social History Narrative   Not on  file   Social Drivers of Health   Financial Resource Strain: Not on file  Food Insecurity: Not on file  Transportation Needs: Not on file  Physical Activity: Not on file  Stress: Not on file  Social Connections: Not on file   No outpatient medications prior to visit.   No facility-administered medications prior to visit.   Allergies  Allergen Reactions   Penicillins Anaphylaxis and Swelling   Shellfish Allergy Hives and Other (See Comments)    Gets a tight itchy throat.    There is no immunization history for the selected administration types on file for this patient.  Health Maintenance  Topic Date Due   DTaP/Tdap/Td (1 - Tdap) Never done   INFLUENZA VACCINE  Never done   COVID-19 Vaccine (1 - 2024-25 season) Never done   Cervical Cancer Screening (HPV/Pap Cotest)  09/09/2025   Hepatitis C Screening  Completed   HIV Screening  Completed   HPV VACCINES  Aged Out    Patient Care Team: Reva Bores, MD as PCP - General (Obstetrics and Gynecology)  Review of Systems  {Insert previous labs (optional):23779} {See past labs  Heme  Chem  Endocrine  Serology  Results Review (optional):1}  Objective    LMP 06/15/2023 (Exact Date)  {Insert last BP/Wt (optional):23777}{See vitals history (optional):1}   Physical Exam ***  Depression Screen     No data to display         No results found for any visits on 07/12/23.  Assessment & Plan     There are no diagnoses linked to this encounter.   No follow-ups on file.      Alfredia Ferguson, PA-C  [facility] Primary Care at [facility] [phone] (phone) [phone] (fax)  [facility]

## 2023-07-12 ENCOUNTER — Ambulatory Visit (INDEPENDENT_AMBULATORY_CARE_PROVIDER_SITE_OTHER): Payer: Medicaid Other | Admitting: Physician Assistant

## 2023-07-12 ENCOUNTER — Encounter: Payer: Self-pay | Admitting: Physician Assistant

## 2023-07-12 VITALS — BP 101/64 | HR 75 | Temp 97.7°F | Ht 67.0 in | Wt 140.4 lb

## 2023-07-12 DIAGNOSIS — Z789 Other specified health status: Secondary | ICD-10-CM | POA: Diagnosis not present

## 2023-07-12 DIAGNOSIS — Z0000 Encounter for general adult medical examination without abnormal findings: Secondary | ICD-10-CM | POA: Diagnosis not present

## 2023-07-12 DIAGNOSIS — R739 Hyperglycemia, unspecified: Secondary | ICD-10-CM

## 2023-07-12 LAB — CBC WITH DIFFERENTIAL/PLATELET
Basophils Absolute: 0 10*3/uL (ref 0.0–0.1)
Basophils Relative: 0.2 % (ref 0.0–3.0)
Eosinophils Absolute: 0.1 10*3/uL (ref 0.0–0.7)
Eosinophils Relative: 2.3 % (ref 0.0–5.0)
HCT: 42.8 % (ref 36.0–46.0)
Hemoglobin: 14.2 g/dL (ref 12.0–15.0)
Lymphocytes Relative: 54.9 % — ABNORMAL HIGH (ref 12.0–46.0)
Lymphs Abs: 2.1 10*3/uL (ref 0.7–4.0)
MCHC: 33 g/dL (ref 30.0–36.0)
MCV: 92.9 fL (ref 78.0–100.0)
Monocytes Absolute: 0.4 10*3/uL (ref 0.1–1.0)
Monocytes Relative: 9.7 % (ref 3.0–12.0)
Neutro Abs: 1.3 10*3/uL — ABNORMAL LOW (ref 1.4–7.7)
Neutrophils Relative %: 32.9 % — ABNORMAL LOW (ref 43.0–77.0)
Platelets: 202 10*3/uL (ref 150.0–400.0)
RBC: 4.61 Mil/uL (ref 3.87–5.11)
RDW: 14.9 % (ref 11.5–15.5)
WBC: 3.8 10*3/uL — ABNORMAL LOW (ref 4.0–10.5)

## 2023-07-12 LAB — TSH: TSH: 1.09 u[IU]/mL (ref 0.35–5.50)

## 2023-07-12 LAB — COMPREHENSIVE METABOLIC PANEL
ALT: 10 U/L (ref 0–35)
AST: 16 U/L (ref 0–37)
Albumin: 4.5 g/dL (ref 3.5–5.2)
Alkaline Phosphatase: 55 U/L (ref 39–117)
BUN: 10 mg/dL (ref 6–23)
CO2: 27 meq/L (ref 19–32)
Calcium: 9 mg/dL (ref 8.4–10.5)
Chloride: 104 meq/L (ref 96–112)
Creatinine, Ser: 0.85 mg/dL (ref 0.40–1.20)
GFR: 86.23 mL/min (ref >60.00)
Glucose, Bld: 78 mg/dL (ref 70–99)
Potassium: 4.3 meq/L (ref 3.5–5.1)
Sodium: 139 meq/L (ref 135–145)
Total Bilirubin: 0.9 mg/dL (ref 0.2–1.2)
Total Protein: 7.1 g/dL (ref 6.0–8.3)

## 2023-07-12 LAB — LIPID PANEL
Cholesterol: 209 mg/dL — ABNORMAL HIGH (ref 0–200)
HDL: 62 mg/dL (ref >39.00)
LDL Cholesterol: 136 mg/dL — ABNORMAL HIGH (ref 0–99)
NonHDL: 147.25
Total CHOL/HDL Ratio: 3
Triglycerides: 56 mg/dL (ref 0.0–149.0)
VLDL: 11.2 mg/dL (ref 0.0–40.0)

## 2023-07-12 LAB — T4, FREE: Free T4: 0.93 ng/dL (ref 0.60–1.60)

## 2023-07-12 LAB — HEMOGLOBIN A1C: Hgb A1c MFr Bld: 5.8 % (ref 4.6–6.5)

## 2023-07-12 LAB — VITAMIN B12: Vitamin B-12: 380 pg/mL (ref 211–911)

## 2023-07-13 ENCOUNTER — Encounter: Payer: Self-pay | Admitting: Physician Assistant

## 2023-07-13 ENCOUNTER — Encounter: Payer: Self-pay | Admitting: Obstetrics and Gynecology

## 2023-07-13 ENCOUNTER — Other Ambulatory Visit: Payer: Self-pay | Admitting: Physician Assistant

## 2023-07-13 DIAGNOSIS — D72819 Decreased white blood cell count, unspecified: Secondary | ICD-10-CM

## 2023-08-11 ENCOUNTER — Ambulatory Visit: Payer: Medicaid Other | Admitting: Physician Assistant

## 2023-08-11 ENCOUNTER — Other Ambulatory Visit: Payer: Medicaid Other

## 2023-08-13 ENCOUNTER — Other Ambulatory Visit: Payer: Self-pay | Admitting: Physician Assistant

## 2023-08-13 ENCOUNTER — Encounter: Payer: Self-pay | Admitting: Physician Assistant

## 2023-08-13 ENCOUNTER — Other Ambulatory Visit (INDEPENDENT_AMBULATORY_CARE_PROVIDER_SITE_OTHER): Payer: Medicaid Other

## 2023-08-13 DIAGNOSIS — D72819 Decreased white blood cell count, unspecified: Secondary | ICD-10-CM

## 2023-08-13 LAB — CBC WITH DIFFERENTIAL/PLATELET
Basophils Absolute: 0 10*3/uL (ref 0.0–0.1)
Basophils Relative: 0.6 % (ref 0.0–3.0)
Eosinophils Absolute: 0.1 10*3/uL (ref 0.0–0.7)
Eosinophils Relative: 3.4 % (ref 0.0–5.0)
HCT: 41.6 % (ref 36.0–46.0)
Hemoglobin: 13.7 g/dL (ref 12.0–15.0)
Lymphocytes Relative: 51.6 % — ABNORMAL HIGH (ref 12.0–46.0)
Lymphs Abs: 2 10*3/uL (ref 0.7–4.0)
MCHC: 33 g/dL (ref 30.0–36.0)
MCV: 92.9 fL (ref 78.0–100.0)
Monocytes Absolute: 0.4 10*3/uL (ref 0.1–1.0)
Monocytes Relative: 9.5 % (ref 3.0–12.0)
Neutro Abs: 1.3 10*3/uL — ABNORMAL LOW (ref 1.4–7.7)
Neutrophils Relative %: 34.9 % — ABNORMAL LOW (ref 43.0–77.0)
Platelets: 236 10*3/uL (ref 150.0–400.0)
RBC: 4.48 Mil/uL (ref 3.87–5.11)
RDW: 14.3 % (ref 11.5–15.5)
WBC: 3.8 10*3/uL — ABNORMAL LOW (ref 4.0–10.5)

## 2023-08-16 ENCOUNTER — Other Ambulatory Visit: Payer: Self-pay | Admitting: Physician Assistant

## 2023-08-16 DIAGNOSIS — D72819 Decreased white blood cell count, unspecified: Secondary | ICD-10-CM

## 2023-08-26 ENCOUNTER — Other Ambulatory Visit (INDEPENDENT_AMBULATORY_CARE_PROVIDER_SITE_OTHER): Payer: Medicaid Other

## 2023-08-26 DIAGNOSIS — D72819 Decreased white blood cell count, unspecified: Secondary | ICD-10-CM | POA: Diagnosis not present

## 2023-08-26 LAB — CBC WITH DIFFERENTIAL/PLATELET
Absolute Lymphocytes: 2033 {cells}/uL (ref 850–3900)
Absolute Monocytes: 512 {cells}/uL (ref 200–950)
Basophils Absolute: 8 {cells}/uL (ref 0–200)
Basophils Relative: 0.2 %
Eosinophils Absolute: 88 {cells}/uL (ref 15–500)
Eosinophils Relative: 2.1 %
HCT: 41.2 % (ref 35.0–45.0)
Hemoglobin: 13.6 g/dL (ref 11.7–15.5)
MCH: 30.4 pg (ref 27.0–33.0)
MCHC: 33 g/dL (ref 32.0–36.0)
MCV: 92.2 fL (ref 80.0–100.0)
MPV: 12.9 fL — ABNORMAL HIGH (ref 7.5–12.5)
Monocytes Relative: 12.2 %
Neutro Abs: 1558 {cells}/uL (ref 1500–7800)
Neutrophils Relative %: 37.1 %
Platelets: 168 10*3/uL (ref 140–400)
RBC: 4.47 10*6/uL (ref 3.80–5.10)
RDW: 13.1 % (ref 11.0–15.0)
Total Lymphocyte: 48.4 %
WBC: 4.2 10*3/uL (ref 3.8–10.8)

## 2023-08-26 NOTE — Addendum Note (Signed)
Addended by: Cheron Every C on: 08/26/2023 09:55 AM   Modules accepted: Orders

## 2023-08-27 ENCOUNTER — Telehealth: Payer: Self-pay

## 2023-08-27 NOTE — Telephone Encounter (Signed)
PCP has not reviewed new labs yet   Copied from CRM [id number]. Topic: Clinical - Lab/Test Results >> Aug 27, 2023 12:46 PM Frances Cohen wrote: Reason for CRM: [id number] please call patient in regards to lab results needing a more indepth explanation

## 2023-08-30 ENCOUNTER — Encounter: Payer: Self-pay | Admitting: Physician Assistant

## 2023-09-02 ENCOUNTER — Encounter: Payer: Self-pay | Admitting: Physician Assistant

## 2023-09-02 ENCOUNTER — Ambulatory Visit (INDEPENDENT_AMBULATORY_CARE_PROVIDER_SITE_OTHER): Payer: Medicaid Other | Admitting: Physician Assistant

## 2023-09-02 VITALS — BP 138/67 | HR 79 | Temp 98.6°F | Ht 67.0 in | Wt 140.2 lb

## 2023-09-02 DIAGNOSIS — B9689 Other specified bacterial agents as the cause of diseases classified elsewhere: Secondary | ICD-10-CM

## 2023-09-02 DIAGNOSIS — N760 Acute vaginitis: Secondary | ICD-10-CM

## 2023-09-02 DIAGNOSIS — R6889 Other general symptoms and signs: Secondary | ICD-10-CM

## 2023-09-02 DIAGNOSIS — R042 Hemoptysis: Secondary | ICD-10-CM | POA: Diagnosis not present

## 2023-09-02 MED ORDER — METRONIDAZOLE 0.75 % VA GEL: 1.0000 | Freq: Every day | VAGINAL | 0 refills | Status: DC

## 2023-09-02 NOTE — Progress Notes (Signed)
 Established patient visit   Patient: Frances Cohen   DOB: February 23, 1986   38 y.o. Female  MRN: [id number] Visit Date: 09/02/2023  Today's healthcare provider: Manuelita Flatness, PA-C   Cc. Cold feeling, red spots, hemoptysis  Subjective     Discussed the use of AI scribe software for clinical note transcription with the patient, who gave verbal consent to proceed.  History of Present Illness   The patient presents with a chief complaint of feeling cold all the time. The patient reports that this symptom has been ongoing since the fall and has been severe enough to necessitate wearing two pairs of pants and two shirts, as well as sleeping in fuzzy socks. The patient also reports noticing small red spots on her skin, which she initially attributed to a reaction to detergent. However, the spots have persisted despite changing detergents. The patient also reports a recent episode of spitting up blood, which has not recurred.She denies reflux, chest pain, heartburn, chronic or new cough.    The patient is currently managing a vaginal infection with a prescribed gel, but reports not completing the full course of treatment during the last episode.      Medications: Outpatient Medications Prior to Visit  Medication Sig   [DISCONTINUED] metroNIDAZOLE  (METROGEL ) 0.75 % vaginal gel Place 1 Applicatorful vaginally at bedtime. Apply one applicatorful to vagina at bedtime for 5 days   No facility-administered medications prior to visit.    Review of Systems  Constitutional:  Negative for fatigue and fever.  Respiratory:  Negative for cough and shortness of breath.        Hemoptysis  Cardiovascular:  Negative for chest pain and leg swelling.  Gastrointestinal:  Negative for abdominal pain.  Neurological:  Negative for dizziness and headaches.       Objective    BP 138/67   Pulse 79   Temp 98.6 F (37 C) (Oral)   Ht 5' 7 (1.702 m)   Wt 140 lb 4 oz (63.6 kg)   SpO2 98%   BMI 21.97  kg/m    Physical Exam Vitals reviewed.  Constitutional:      Appearance: She is not ill-appearing.  HENT:     Head: Normocephalic.  Eyes:     Conjunctiva/sclera: Conjunctivae normal.  Cardiovascular:     Rate and Rhythm: Normal rate.  Pulmonary:     Effort: Pulmonary effort is normal. No respiratory distress.  Neurological:     General: No focal deficit present.     Mental Status: She is alert and oriented to person, place, and time.  Psychiatric:        Mood and Affect: Mood normal.        Behavior: Behavior normal.   Reviewed photos of red spots-- pt currently does not have any-- they are < 1 mm - 1 mm red flecks, consistent with small angiomas.   No results found for any visits on 09/02/23.  Assessment & Plan    Cold feeling Blood work showed no anemia, thyroid  dysfunction, or vitamin B12 deficiency. Possible causes include dietary deficiencies or individual sensitivity to cold weather. Reassured that this is not indicative of a serious condition. - Encourage consumption of iron-rich foods such as spinach, kale, and lentils Hemoptysis Hemoptysis Experienced a single episode of hemoptysis without associated symptoms such as cough, heartburn, or gum bleeding. Discussed that if it recurs, a chest x-ray would be the next step to rule out lung pathology.  BV (bacterial vaginosis) -  Refill prescription for vaginal gel - Complete the full course of treatment - Consider probiotics for vaginal health -     metroNIDAZOLE ; Place 1 Applicatorful vaginally at bedtime. Apply one applicatorful to vagina at bedtime for 5 days  Dispense: 70 g; Refill: 0  Return if symptoms worsen or fail to improve.       Manuelita Flatness, PA-C  [facility] Primary Care at [facility] [phone] (phone) [phone] (fax)  [facility]

## 2023-11-01 ENCOUNTER — Other Ambulatory Visit: Payer: Self-pay | Admitting: Physician Assistant

## 2023-11-01 ENCOUNTER — Telehealth: Payer: Self-pay | Admitting: *Deleted

## 2023-11-01 DIAGNOSIS — D72819 Decreased white blood cell count, unspecified: Secondary | ICD-10-CM

## 2023-11-01 NOTE — Telephone Encounter (Signed)
 Pt has lab appt on 11/04/23 and no future orders are in EPIC.  Can you place future orders if appropriate or call pt to cancel lab appt if labs not needed at this time?

## 2023-11-04 ENCOUNTER — Other Ambulatory Visit: Payer: Medicaid Other

## 2024-01-03 ENCOUNTER — Ambulatory Visit: Payer: Self-pay

## 2024-01-03 NOTE — Telephone Encounter (Signed)
 FYI Only or Action Required?: FYI only for provider  Patient was last seen in primary care on 03/07/2021 by Mardene Shake, FNP. Called Nurse Triage reporting No chief complaint on file.. Symptoms began yesterday. Interventions attempted: Nothing. Symptoms are: gradually worsening.  Triage Disposition: See Physician Within 24 Hours  Patient/caregiver understands and will follow disposition?: Yes  Copied from CRM [id number]. Topic: Clinical - Red Word Triage >> Jan 03, 2024  8:34 AM Juliana Ocean wrote: Red Word that prompted transfer to Nurse Triage: pt is having severe pain in left side pelvic area. Started yesterday.  Reason for Disposition  [1] MODERATE (e.g., interferes with normal activities) pelvic pain AND [2] pain comes and goes (cramps) AND [3] present > 24 hours  Answer Assessment - Initial Assessment Questions 1. LOCATION: "Where does it hurt?"      Thigh, Pelvic Area (Left)  2. RADIATION: "Does the pain shoot anywhere else?" (e.g., lower back, groin, thighs)     No  3. ONSET: "When did the pain begin?" (e.g., minutes, hours or days ago)      Yesterday  4. SUDDEN: "Gradual or sudden onset?"     Sudden  5. PATTERN "Does the pain come and go, or is it constant?"    - If constant: "Is it getting better, staying the same, or worsening?"      (Note: Constant means the pain never goes away completely; most serious pain is constant and gets worse over time)     - If intermittent: "How long does it last?" "Do you have pain now?"     (Note: Intermittent means the pain goes away completely between bouts)     Intermittent  6. SEVERITY: "How bad is the pain?"  (e.g., Scale 1-10; mild, moderate, or severe)   - MILD (1-3): doesn't interfere with normal activities, area soft and not tender to touch    - MODERATE (4-7): interferes with normal activities or awakens from sleep, abdomen tender to touch    - SEVERE (8-10): excruciating pain, doubled over, unable to do any normal activities       Severe  7. RECURRENT SYMPTOM: "Have you ever had this type of pelvic pain before?" If Yes, ask: "When was the last time?" and "What happened that time?"      No 8. CAUSE: "What do you think is causing the pelvic pain?"     Hernia,  9. RELIEVING/AGGRAVATING FACTORS: "What makes it better or worse?" (e.g., activity/rest, sexual intercourse, voiding, passing stool)     Bowel Movements relieved the pain  10. OTHER SYMPTOMS: "Has there been any other symptoms?" (e.g., fever, constipation, diarrhea, urine problems, vaginal bleeding, vaginal discharge, or vomiting?"       Bowel Movements are softer  11. PREGNANCY: "Is there any chance you are pregnant?" "When was your last menstrual period?"       LMP 12-20-2023  Protocols used: Pelvic Pain - [facility]

## 2024-01-04 ENCOUNTER — Ambulatory Visit (INDEPENDENT_AMBULATORY_CARE_PROVIDER_SITE_OTHER): Admitting: Physician Assistant

## 2024-01-04 ENCOUNTER — Encounter: Payer: Self-pay | Admitting: Physician Assistant

## 2024-01-04 VITALS — BP 99/65 | HR 66 | Ht 67.0 in | Wt 144.0 lb

## 2024-01-04 DIAGNOSIS — R102 Pelvic and perineal pain: Secondary | ICD-10-CM | POA: Diagnosis not present

## 2024-01-04 DIAGNOSIS — Z8719 Personal history of other diseases of the digestive system: Secondary | ICD-10-CM

## 2024-01-04 NOTE — Progress Notes (Signed)
      Established patient visit   Patient: Frances Cohen   DOB: 05-Aug-1985   38 y.o. Female  MRN: [id number] Visit Date: 01/04/2024  Today's healthcare provider: Trenton Frock, PA-C   Groin pain  Subjective     Discussed the use of AI scribe software for clinical note transcription with the patient, who gave verbal consent to proceed.  History of Present Illness   Frances Cohen is a 38 year old female who presents with recurrent lower abdominal pain and suspected hernia.  She has experienced recurrent left sided lower abdominal pain since the birth of her daughter nine years ago, with recent intensification after resuming exercise two days ago. The pain is located in the lower abdomen and radiates to her groin, described as soreness, particularly when sitting, and tends to subside after using the bathroom. There is no diarrhea or hard stools.       Medications: Outpatient Medications Prior to Visit  Medication Sig   [DISCONTINUED] metroNIDAZOLE  (METROGEL ) 0.75 % vaginal gel Place 1 Applicatorful vaginally at bedtime. Apply one applicatorful to vagina at bedtime for 5 days   No facility-administered medications prior to visit.    Review of Systems  Constitutional:  Negative for fatigue and fever.  Respiratory:  Negative for cough and shortness of breath.   Cardiovascular:  Negative for chest pain and leg swelling.  Gastrointestinal:  Positive for abdominal pain.  Neurological:  Negative for dizziness and headaches.       Objective    BP 99/65   Pulse 66   Ht 5\' 7"  (1.702 m)   Wt 144 lb (65.3 kg)   BMI 22.55 kg/m    Physical Exam Vitals reviewed.  Constitutional:      Appearance: She is not ill-appearing.  HENT:     Head: Normocephalic.  Eyes:     Conjunctiva/sclera: Conjunctivae normal.  Cardiovascular:     Rate and Rhythm: Normal rate.  Pulmonary:     Effort: Pulmonary effort is normal. No respiratory distress.  Abdominal:     General: Bowel sounds  are normal.     Palpations: Abdomen is soft.     Tenderness: There is abdominal tenderness in the left lower quadrant.     Comments: No palpable hernia, no palpable inguinal lymphadenopathy.    Neurological:     Mental Status: She is alert and oriented to person, place, and time.  Psychiatric:        Mood and Affect: Mood normal.        Behavior: Behavior normal.     No results found for any visits on 01/04/24.  Assessment & Plan    Pelvic pain -     CT ABDOMEN PELVIS W CONTRAST; Future  History of inguinal hernia -     CT ABDOMEN PELVIS W CONTRAST; Future  Recommending CT abdomen pelvis to ID hernia and further dictate treatment.   Return if symptoms worsen or fail to improve.       Trenton Frock, PA-C  [facility] Primary Care at [facility] [phone] (phone) [phone] (fax)  [facility]

## 2024-01-06 ENCOUNTER — Telehealth: Payer: Self-pay | Admitting: Physician Assistant

## 2024-01-06 NOTE — Telephone Encounter (Signed)
 Copied from CRM [id number]. Topic: Clinical - Medication Prior Auth >> Jan 06, 2024  2:03 PM [place] A wrote: Reason for CRM: Soyla Duverney with [facility] called to see if patient needs PA for appointment on 6/16-please call [phone] ext. [phone]

## 2024-01-06 NOTE — Telephone Encounter (Signed)
 Can you check status of CT order please?

## 2024-01-06 NOTE — Telephone Encounter (Signed)
 Copied from CRM [id number]. Topic: Referral - Status >> Jan 05, 2024  5:14 PM Shereese L wrote: Reason for CRM: patient called in and stated that she's waiting on the referral status CT ABDOMEN PELVIS W CONTRAST; Future and would like a call back

## 2024-01-10 ENCOUNTER — Ambulatory Visit
Admission: RE | Admit: 2024-01-10 | Discharge: 2024-01-10 | Disposition: A | Discharge status: Home / Self Care | Source: Ambulatory Visit | Attending: Physician Assistant | Admitting: Physician Assistant

## 2024-01-10 DIAGNOSIS — N200 Calculus of kidney: Secondary | ICD-10-CM | POA: Diagnosis not present

## 2024-01-10 DIAGNOSIS — R102 Pelvic and perineal pain: Secondary | ICD-10-CM

## 2024-01-10 DIAGNOSIS — Z8719 Personal history of other diseases of the digestive system: Secondary | ICD-10-CM

## 2024-01-10 DIAGNOSIS — N83202 Unspecified ovarian cyst, left side: Secondary | ICD-10-CM | POA: Diagnosis not present

## 2024-01-10 DIAGNOSIS — N83201 Unspecified ovarian cyst, right side: Secondary | ICD-10-CM | POA: Diagnosis not present

## 2024-01-10 DIAGNOSIS — K5900 Constipation, unspecified: Secondary | ICD-10-CM | POA: Diagnosis not present

## 2024-01-10 MED ORDER — IOPAMIDOL (ISOVUE-300) INJECTION 61%
100.0000 mL | Freq: Once | INTRAVENOUS | Status: AC | PRN
Start: 2024-01-10 — End: 2024-01-10
  Administered 2024-01-10: 100 mL via INTRAVENOUS

## 2024-01-17 ENCOUNTER — Ambulatory Visit: Payer: Self-pay | Admitting: Physician Assistant

## 2024-01-17 DIAGNOSIS — N83202 Unspecified ovarian cyst, left side: Secondary | ICD-10-CM

## 2024-01-19 ENCOUNTER — Ambulatory Visit

## 2024-01-19 DIAGNOSIS — D259 Leiomyoma of uterus, unspecified: Secondary | ICD-10-CM | POA: Diagnosis not present

## 2024-01-19 DIAGNOSIS — R21 Rash and other nonspecific skin eruption: Secondary | ICD-10-CM | POA: Diagnosis not present

## 2024-01-19 DIAGNOSIS — L509 Urticaria, unspecified: Secondary | ICD-10-CM | POA: Diagnosis not present

## 2024-01-19 DIAGNOSIS — N83202 Unspecified ovarian cyst, left side: Secondary | ICD-10-CM

## 2024-01-27 ENCOUNTER — Ambulatory Visit: Payer: Self-pay | Admitting: Physician Assistant

## 2024-01-27 DIAGNOSIS — N83299 Other ovarian cyst, unspecified side: Secondary | ICD-10-CM

## 2024-01-27 DIAGNOSIS — R9389 Abnormal findings on diagnostic imaging of other specified body structures: Secondary | ICD-10-CM

## 2024-01-31 ENCOUNTER — Other Ambulatory Visit: Payer: Self-pay | Admitting: Physician Assistant

## 2024-01-31 DIAGNOSIS — N83299 Other ovarian cyst, unspecified side: Secondary | ICD-10-CM

## 2024-01-31 DIAGNOSIS — R9389 Abnormal findings on diagnostic imaging of other specified body structures: Secondary | ICD-10-CM

## 2024-02-02 ENCOUNTER — Ambulatory Visit: Admitting: Obstetrics and Gynecology

## 2024-02-02 ENCOUNTER — Encounter: Payer: Self-pay | Admitting: Obstetrics and Gynecology

## 2024-02-02 ENCOUNTER — Other Ambulatory Visit (HOSPITAL_COMMUNITY)
Admission: RE | Admit: 2024-02-02 | Discharge: 2024-02-02 | Disposition: A | Discharge status: Home / Self Care | Source: Ambulatory Visit | Attending: Obstetrics and Gynecology | Admitting: Obstetrics and Gynecology

## 2024-02-02 VITALS — BP 106/72 | HR 70 | Ht 67.0 in | Wt 143.0 lb

## 2024-02-02 DIAGNOSIS — Z124 Encounter for screening for malignant neoplasm of cervix: Secondary | ICD-10-CM

## 2024-02-02 DIAGNOSIS — R102 Pelvic and perineal pain: Secondary | ICD-10-CM

## 2024-02-02 DIAGNOSIS — G8929 Other chronic pain: Secondary | ICD-10-CM | POA: Diagnosis not present

## 2024-02-02 DIAGNOSIS — Z01419 Encounter for gynecological examination (general) (routine) without abnormal findings: Secondary | ICD-10-CM

## 2024-02-02 DIAGNOSIS — N83201 Unspecified ovarian cyst, right side: Secondary | ICD-10-CM | POA: Diagnosis not present

## 2024-02-02 DIAGNOSIS — Z1151 Encounter for screening for human papillomavirus (HPV): Secondary | ICD-10-CM | POA: Diagnosis not present

## 2024-02-02 DIAGNOSIS — Z113 Encounter for screening for infections with a predominantly sexual mode of transmission: Secondary | ICD-10-CM | POA: Insufficient documentation

## 2024-02-02 DIAGNOSIS — N83202 Unspecified ovarian cyst, left side: Secondary | ICD-10-CM

## 2024-02-02 MED ORDER — BACLOFEN 10 MG PO TABS: 10.0000 mg | ORAL_TABLET | Freq: Two times a day (BID) | ORAL | 2 refills | Status: DC | PRN

## 2024-02-02 NOTE — Progress Notes (Signed)
 Pt states she was referred by PCP for fibroid/cyst.  Pt has had u/s and CT scan, ? Need for MRI.  Pt has been having lower left pelvic pain, will come and go.  Pt states that motion (walking, standing)  will aggravate pain.

## 2024-02-02 NOTE — Progress Notes (Signed)
 NEW GYNECOLOGY PATIENT Patient name: Frances Cohen MRN [id number]  Date of birth: September 30, 1985 Chief Complaint:   Annual Exam     History:  Frances Cohen is a 38 y.o. [phone] being seen today for pelvic pain.    Discussed the use of AI scribe software for clinical note transcription with the patient, who gave verbal consent to proceed.  History of Present Illness Frances Cohen is a 38 year old female with fibroids and ovarian cysts who presents with pelvic pain.  She has been experiencing pelvic pain since May, coinciding with her menstrual cycle. The pain is described as discomfort that becomes 'crippling' and 'grimacing' when aggravated, primarily on one side. It has caused her to pull over while driving due to its intensity. Standing or walking exacerbates the pain, and she takes Aleve  for relief. The pain is sharp and sometimes burning, particularly when she needs to use the restroom, and it radiates to a specific area in the pelvic region.  She has had prior CT and ultrasound evaluations that showed fibroids and ovarian cysts. The current pain is similar to what she experienced after childbirth nine years ago. She has not been sexually active recently and has no abnormal vaginal discharge. Her menstrual cycle has changed slightly, starting a couple of days earlier in the past three months, with darker blood noted on the first day of her last cycle.  No changes in bowel habits, though she notes more frequent bowel movements without diarrhea or constipation. No changes in urination frequency, though she suspects mild dehydration. No fever, chills, nausea, or vomiting. Her past medical history includes three C-sections, and she has no history of abnormal Pap smears since her teenage years. She is allergic to penicillin and shellfish and does not take any prescription medications, only Aleve  for pain.  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       Gynecologic History Patient's last menstrual period was 01/16/2024. Contraception: abstinence Last Pap: none on file Last Mammogram: not previously indicated Last Colonoscopy: n/a  Obstetric History OB History  Gravida Para Term Preterm AB Living  6 3 3  3 3   SAB IAB Ectopic Multiple Live Births  2   0 3    # Outcome Date GA Lbr Len/2nd Weight Sex Type Anes PTL Lv  6 Term 01/01/15 [redacted]w[redacted]d  8 lb 8.9 oz (3.881 kg) F CS-LTranv Spinal  LIV  5 SAB 02/10/14 [redacted]w[redacted]d         4 Term 12/10/11 [redacted]w[redacted]d  7 lb 14.8 oz (3.595 kg) F CS-LTranv Spinal  LIV  3 SAB 11/2010 [redacted]w[redacted]d         2 Term 05/2009 [redacted]w[redacted]d  8 lb 3 oz (3.714 kg) M CS-LTranv Spinal  LIV  1 AB 08/2006 [redacted]w[redacted]d       DEC    Past Medical History:  Diagnosis Date   Abnormal Pap smear 2003   Fibroid    GERD (gastroesophageal reflux disease)    using tums    Hernia 09-2011   Migraine    SAB (spontaneous abortion) 12/2010   TWO    Past Surgical History:  Procedure Laterality Date   CESAREAN SECTION     CESAREAN SECTION  12/10/2011   Procedure: CESAREAN SECTION;  Surgeon: Winton Felt, MD;  Location: [facility];  Service: Gynecology;  Laterality: N/A;   CESAREAN SECTION WITH BILATERAL TUBAL LIGATION Bilateral 01/01/2015   Procedure: CESAREAN SECTION WITH BILATERAL TUBAL LIGATION;  Surgeon: Burnard VEAR Pate, MD;  Location: [facility];  Service: Obstetrics;  Laterality: Bilateral;   TONSILLECTOMY     WISDOM TOOTH EXTRACTION  2004    x4    No current outpatient medications on file prior to visit.   No current facility-administered medications on file prior to visit.    Allergies  Allergen Reactions   Penicillins Anaphylaxis and Swelling   Shellfish Allergy Hives and Other (See Comments)    Gets a tight itchy throat.    Social History:  reports that she has never smoked. She has never used smokeless tobacco. She reports that she does not drink alcohol and does not use drugs.  Family History  Problem Relation Age of Onset    Diabetes Mother    Lung cancer Paternal Aunt    Breast cancer Paternal Grandmother 55   Prostate cancer Paternal Grandfather     The following portions of the patient's history were reviewed and updated as appropriate: allergies, current medications, past family history, past medical history, past social history, past surgical history and problem list.  Review of Systems Pertinent items noted in HPI and remainder of comprehensive ROS otherwise negative.  Physical Exam:  BP 106/72   Pulse 70   Ht 5' 7 (1.702 m)   Wt 143 lb (64.9 kg)   LMP 01/16/2024   BMI 22.40 kg/m  Physical Exam Vitals and nursing note reviewed. Exam conducted with a chaperone present.  Constitutional:      Appearance: Normal appearance.  Pulmonary:     Effort: Pulmonary effort is normal.  Abdominal:     Palpations: Abdomen is soft.     Comments: + carnett  Genitourinary:    General: Normal vulva.     Exam position: Lithotomy position.     Comments: Normal appearing vulva Right levator ani 8/10 Right ischiococcygeous 2/10 Right obturator internus 2/10 Left levator ani 8/10 Left ischioccocygeous tender Left obturator internus 2/10 Deep levator ani tenderness Mild adnexal tenderness   Neurological:     Mental Status: She is alert.      Assessment and Plan:   Assessment & Plan Pelvic Floor Myalgia Recurrent right-sided pelvic pain, sharp and burning, likely due to pelvic floor myalgia from over-bracing. Differential includes cysts, but primary suspicion is pelvic floor myalgia. - Refer to pelvic floor physical therapy. - Prescribe muscle relaxer for trial use.  Ovarian Cysts Complex cysts on both ovaries, possibly hemorrhagic, likely related to ovulation. MRI planned for further assessment. - Order pelvic MRI for better evaluation   Uterine Fibroids Small fibroids (<1 cm) not causing symptoms. Monitoring recommended. - Monitor fibroids; no immediate intervention.  General Health  Maintenance History of three C-sections, no recent abnormal Pap smears. - Perform Pap smear for cervical cancer screening.  Follow-up Follow-up based on MRI and Pap smear results. - Follow up in three months if MRI is normal. - Discuss MRI results and any abnormal findings as needed.   Routine preventative health maintenance measures emphasized. Please refer to After Visit Summary for other counseling recommendations.   Follow-up: Return in about 3 months (around 05/04/2024).      Carter Quarry, MD Obstetrician & Gynecologist, [facility] [employer], [facility]

## 2024-02-03 ENCOUNTER — Ambulatory Visit: Payer: Self-pay | Admitting: Obstetrics and Gynecology

## 2024-02-03 ENCOUNTER — Telehealth: Payer: Self-pay | Admitting: Obstetrics and Gynecology

## 2024-02-03 LAB — CERVICOVAGINAL ANCILLARY ONLY
Bacterial Vaginitis (gardnerella): NEGATIVE
Candida Glabrata: NEGATIVE
Candida Vaginitis: NEGATIVE
Chlamydia: NEGATIVE
Comment: NEGATIVE
Comment: NEGATIVE
Comment: NEGATIVE
Comment: NEGATIVE
Comment: NEGATIVE
Comment: NORMAL
Neisseria Gonorrhea: NEGATIVE
Trichomonas: NEGATIVE

## 2024-02-03 NOTE — Telephone Encounter (Signed)
 Pt called back and stated her company declined fmla form is no longer needed.

## 2024-02-03 NOTE — Telephone Encounter (Signed)
 Received by fax FMLA forms ([employer]) called pt unable to leave message

## 2024-02-08 LAB — CYTOLOGY - PAP
Adequacy: ABSENT
Comment: NEGATIVE
Diagnosis: NEGATIVE
High risk HPV: NEGATIVE

## 2024-02-16 ENCOUNTER — Encounter: Payer: Self-pay | Admitting: Obstetrics and Gynecology

## 2024-02-22 ENCOUNTER — Inpatient Hospital Stay: Admission: RE | Admit: 2024-02-22 | Source: Ambulatory Visit

## 2024-02-28 ENCOUNTER — Ambulatory Visit
Admission: RE | Admit: 2024-02-28 | Discharge: 2024-02-28 | Disposition: A | Discharge status: Home / Self Care | Source: Ambulatory Visit | Attending: Obstetrics and Gynecology | Admitting: Obstetrics and Gynecology

## 2024-02-28 DIAGNOSIS — G8929 Other chronic pain: Secondary | ICD-10-CM

## 2024-02-28 DIAGNOSIS — N83292 Other ovarian cyst, left side: Secondary | ICD-10-CM | POA: Diagnosis not present

## 2024-02-28 DIAGNOSIS — N83291 Other ovarian cyst, right side: Secondary | ICD-10-CM | POA: Diagnosis not present

## 2024-02-28 DIAGNOSIS — N83201 Unspecified ovarian cyst, right side: Secondary | ICD-10-CM

## 2024-02-28 MED ORDER — GADOPICLENOL 0.5 MMOL/ML IV SOLN
6.0000 mL | Freq: Once | INTRAVENOUS | Status: AC | PRN
  Administered 2024-02-28: 6 mL via INTRAVENOUS

## 2024-02-29 ENCOUNTER — Ambulatory Visit
Admission: RE | Admit: 2024-02-29 | Discharge: 2024-02-29 | Disposition: A | Discharge status: Home / Self Care | Source: Ambulatory Visit | Attending: Obstetrics and Gynecology | Admitting: Obstetrics and Gynecology

## 2024-02-29 DIAGNOSIS — Z1231 Encounter for screening mammogram for malignant neoplasm of breast: Secondary | ICD-10-CM | POA: Diagnosis not present

## 2024-02-29 DIAGNOSIS — Z01419 Encounter for gynecological examination (general) (routine) without abnormal findings: Secondary | ICD-10-CM

## 2024-03-01 ENCOUNTER — Emergency Department (HOSPITAL_COMMUNITY)
Admission: EM | Admit: 2024-03-01 | Discharge: 2024-03-01 | Disposition: A | Discharge status: Home / Self Care | Attending: Emergency Medicine | Admitting: Emergency Medicine

## 2024-03-01 ENCOUNTER — Emergency Department (HOSPITAL_COMMUNITY)

## 2024-03-01 ENCOUNTER — Other Ambulatory Visit: Payer: Self-pay

## 2024-03-01 DIAGNOSIS — N939 Abnormal uterine and vaginal bleeding, unspecified: Secondary | ICD-10-CM | POA: Diagnosis not present

## 2024-03-01 DIAGNOSIS — N83202 Unspecified ovarian cyst, left side: Secondary | ICD-10-CM | POA: Diagnosis not present

## 2024-03-01 DIAGNOSIS — N83201 Unspecified ovarian cyst, right side: Secondary | ICD-10-CM | POA: Diagnosis not present

## 2024-03-01 LAB — CBC
HCT: 40.7 % (ref 36.0–46.0)
Hemoglobin: 13.2 g/dL (ref 12.0–15.0)
MCH: 29.9 pg (ref 26.0–34.0)
MCHC: 32.4 g/dL (ref 30.0–36.0)
MCV: 92.3 fL (ref 80.0–100.0)
Platelets: 238 K/uL (ref 150–400)
RBC: 4.41 MIL/uL (ref 3.87–5.11)
RDW: 13.7 % (ref 11.5–15.5)
WBC: 3.4 K/uL — ABNORMAL LOW (ref 4.0–10.5)
nRBC: 0 % (ref 0.0–0.2)

## 2024-03-01 LAB — BASIC METABOLIC PANEL WITH GFR
Anion gap: 8 (ref 5–15)
BUN: 10 mg/dL (ref 6–20)
CO2: 24 mmol/L (ref 22–32)
Calcium: 8.9 mg/dL (ref 8.9–10.3)
Chloride: 105 mmol/L (ref 98–111)
Creatinine, Ser: 0.86 mg/dL (ref 0.44–1.00)
GFR, Estimated: >60 mL/min (ref >60)
Glucose, Bld: 97 mg/dL (ref 70–99)
Potassium: 4 mmol/L (ref 3.5–5.1)
Sodium: 137 mmol/L (ref 135–145)

## 2024-03-01 LAB — HCG, SERUM, QUALITATIVE: Preg, Serum: NEGATIVE

## 2024-03-01 LAB — PROTIME-INR
INR: 1 (ref 0.8–1.2)
Prothrombin Time: 13.4 s (ref 11.4–15.2)

## 2024-03-01 MED ORDER — ONDANSETRON HCL 4 MG/2ML IJ SOLN
4.0000 mg | Freq: Once | INTRAMUSCULAR | Status: DC
  Filled 2024-03-01 (×2): qty 2

## 2024-03-01 MED ORDER — KETOROLAC TROMETHAMINE 30 MG/ML IJ SOLN
30.0000 mg | Freq: Once | INTRAMUSCULAR | Status: AC
  Administered 2024-03-01: 30 mg via INTRAVENOUS
  Filled 2024-03-01: qty 1

## 2024-03-01 MED ORDER — NORETHINDRONE ACETATE 5 MG PO TABS
5.0000 mg | ORAL_TABLET | Freq: Every day | ORAL | Status: DC
  Administered 2024-03-01: 5 mg via ORAL
  Filled 2024-03-01: qty 1

## 2024-03-01 MED ORDER — MORPHINE SULFATE (PF) 4 MG/ML IV SOLN
4.0000 mg | Freq: Once | INTRAVENOUS | Status: DC
  Filled 2024-03-01: qty 1

## 2024-03-01 MED ORDER — SODIUM CHLORIDE 0.9 % IV BOLUS
1000.0000 mL | Freq: Once | INTRAVENOUS | Status: AC
  Administered 2024-03-01: 1000 mL via INTRAVENOUS

## 2024-03-01 MED ORDER — NORETHINDRONE ACETATE 5 MG PO TABS: 5.0000 mg | ORAL_TABLET | Freq: Every day | ORAL | 0 refills | Status: DC

## 2024-03-01 NOTE — ED Notes (Signed)
 Pt off the floor to ultrasound

## 2024-03-01 NOTE — Discharge Instructions (Signed)
 Please take the medication as prescribed.  Follow closely with your GYN team.  Return with any new or suddenly worsening symptoms.

## 2024-03-01 NOTE — ED Provider Notes (Signed)
 Emergency Department Provider Note   I have reviewed the triage vital signs and the nursing notes.   HISTORY  Chief Complaint Vaginal Bleeding   HPI Frances Cohen is a 38 y.o. female with past history reviewed below presents to the emergency department with heavy vaginal bleeding over the past 2 days.  Last menstrual cycle was 2 weeks prior.  She has struggled with ongoing pelvic pain and follows with OB/GYN.  She is found to have complex ovarian cysts and fibroids recently.  Bleeding had not been a significant component of her fibroids in the past.  She is not on any OCPs.  She has had a tubal ligation.  Her bleeding has increased overnight with passage of clots and she has developed increasing pelvic pain which is more diffuse and currently 8 out of 10 in severity.  No fever or UTI symptoms.   Past Medical History:  Diagnosis Date   Abnormal Pap smear 2003   Fibroid    GERD (gastroesophageal reflux disease)    using tums    Hernia 09-2011   Migraine    SAB (spontaneous abortion) 12/2010   TWO    Review of Systems  Constitutional: No fever/chills Cardiovascular: Denies chest pain. Respiratory: Denies shortness of breath. Gastrointestinal: No abdominal pain.  No nausea, no vomiting.   Genitourinary: Negative for dysuria. Positive vaginal bleeding.  Musculoskeletal: Negative for back pain. Skin: Negative for rash. Neurological: Negative for headaches.  ____________________________________________   PHYSICAL EXAM:  VITAL SIGNS: ED Triage Vitals  Encounter Vitals Group     BP 03/01/24 1012 (!) 127/93     Pulse Rate 03/01/24 1012 76     Resp 03/01/24 1012 16     Temp 03/01/24 1012 98.7 F (37.1 C)     Temp Source 03/01/24 1012 Oral     SpO2 03/01/24 1012 100 %     Weight 03/01/24 1015 144 lb (65.3 kg)     Height 03/01/24 1015 5' 7 (1.702 m)   Constitutional: Alert and oriented. Well appearing and in no acute distress. Eyes: Conjunctivae are normal.  Head:  Atraumatic. Nose: No congestion/rhinnorhea. Mouth/Throat: Mucous membranes are moist.   Neck: No stridor.  Cardiovascular: Normal rate, regular rhythm. Good peripheral circulation. Grossly normal heart sounds.   Respiratory: Normal respiratory effort.  No retractions. Lungs CTAB. Gastrointestinal: Soft and nontender. No distention.  Musculoskeletal: No gross deformities of extremities. Neurologic:  Normal speech and language.  Skin:  Skin is warm, dry and intact. No rash noted.   ____________________________________________   LABS (all labs ordered are listed, but only abnormal results are displayed)  Labs Reviewed  HCG, SERUM, QUALITATIVE  CBC  BASIC METABOLIC PANEL WITH GFR  PROTIME-INR  TYPE AND SCREEN   ____________________________________________  RADIOLOGY  No results found.  ____________________________________________   PROCEDURES  Procedure(s) performed:   Procedures  None  ____________________________________________   INITIAL IMPRESSION / ASSESSMENT AND PLAN / ED COURSE  Pertinent labs & imaging results that were available during my care of the patient were reviewed by me and considered in my medical decision making (see chart for details).   This patient is Presenting for Evaluation of vaginal bleeding, which does require a range of treatment options, and is a complaint that involves a high risk of morbidity and mortality.  The Differential Diagnoses includes bleeding from fibroids, complex ovarian cyst, ovarian torsion, PID, ectopic pregnancy, etc.  Critical Interventions-    Medications  sodium chloride  0.9 % bolus 1,000 mL (has no  administration in time range)  morphine  (PF) 4 MG/ML injection 4 mg (has no administration in time range)  ondansetron  (ZOFRAN ) injection 4 mg (has no administration in time range)    Reassessment after intervention:    I decided to review pertinent External Data, and in [facility].   Clinical Laboratory Tests Ordered, included ***  Radiologic Tests Ordered, included Pelvic US . I independently interpreted the images and agree with radiology interpretation.   Cardiac Monitor Tracing which shows NSR.    Social Determinants of Health Risk patient is a non-smoker.   Consult complete with  Medical Decision Making: Summary:  Patient presents to the emergency department for evaluation of pelvic pain now with heavy vaginal bleeding.  She is not on OCPs.  Suspect breakthrough bleeding with symptomatic fibroids but will obtain pelvic ultrasound to rule out torsion as she does have known, bilateral ovarian cysts.  MRI performed on Monday available but results are pending.   Reevaluation with update and discussion with   ***Considered admission***  Patient's presentation is most consistent with acute presentation with potential threat to life or bodily function.   Disposition:   ____________________________________________  FINAL CLINICAL IMPRESSION(S) / ED DIAGNOSES  Final diagnoses:  None     NEW OUTPATIENT MEDICATIONS STARTED DURING THIS VISIT:  New Prescriptions   No medications on file    Note:  This document was prepared using Dragon voice recognition software and may include unintentional dictation errors.  Fonda Law, MD, [facility]

## 2024-03-01 NOTE — ED Triage Notes (Signed)
 Pt reports vaginal bleeding with clots starting yesterday. LMP 2 weeks ago. Hx complex cysts and fibroids, had MRI Monday, no results yet. Low abd crampy pain. No fevers, no UTI sx

## 2024-03-05 ENCOUNTER — Encounter: Payer: Self-pay | Admitting: Physician Assistant

## 2024-03-10 ENCOUNTER — Telehealth (INDEPENDENT_AMBULATORY_CARE_PROVIDER_SITE_OTHER): Admitting: Obstetrics and Gynecology

## 2024-03-10 DIAGNOSIS — N939 Abnormal uterine and vaginal bleeding, unspecified: Secondary | ICD-10-CM

## 2024-03-10 DIAGNOSIS — N946 Dysmenorrhea, unspecified: Secondary | ICD-10-CM

## 2024-03-10 DIAGNOSIS — G8929 Other chronic pain: Secondary | ICD-10-CM | POA: Diagnosis not present

## 2024-03-10 DIAGNOSIS — R102 Pelvic and perineal pain: Secondary | ICD-10-CM | POA: Diagnosis not present

## 2024-03-10 MED ORDER — NORETHINDRONE ACETATE 5 MG PO TABS: 5.0000 mg | ORAL_TABLET | Freq: Every day | ORAL | 6 refills | Status: AC

## 2024-03-10 NOTE — Progress Notes (Signed)
 GYNECOLOGY VIRTUAL VISIT ENCOUNTER NOTE  Provider location: Center for [facility] at [facility]   Patient location: Home  I connected with Frances Cohen on 03/10/24 at 10:15 AM EDT by MyChart Video Encounter and verified that I am speaking with the correct person using two identifiers.   I discussed the limitations, risks, security and privacy concerns of performing an evaluation and management service virtually and the availability of in person appointments. I also discussed with the patient that there may be a patient responsible charge related to this service. The patient expressed understanding and agreed to proceed.   History:  Frances Cohen is a 38 y.o. [phone] female being evaluated today for AUB, dysmenorrhea and pelvic pain. Has been groggy or high with the muscle relaxer. No bleeding for 2 days with the medication. Feels more fatigued. 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 When taking the medication has a harder time with focusing. Works in [employer] and would not be able to drive with the medication. Has not had much pain. Went to ED due to passing a lot of clots with her bleeding. 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, involved at church and has a typically very busy schedule PFPT is scheduled and first visit is 9/3 Does not want a hysterectomy      Past Medical History:  Diagnosis Date   Abnormal Pap smear 2003   Fibroid    GERD (gastroesophageal reflux disease)    using tums    Hernia 09-2011   Migraine    SAB (spontaneous abortion) 12/2010   TWO   Past Surgical History:  Procedure Laterality Date   CESAREAN SECTION     CESAREAN SECTION  12/10/2011   Procedure: CESAREAN SECTION;  Surgeon: Winton Felt, MD;  Location: [facility];  Service: Gynecology;  Laterality: N/A;   CESAREAN SECTION WITH BILATERAL TUBAL LIGATION Bilateral 01/01/2015   Procedure: CESAREAN  SECTION WITH BILATERAL TUBAL LIGATION;  Surgeon: Burnard VEAR Pate, MD;  Location: [facility];  Service: Obstetrics;  Laterality: Bilateral;   TONSILLECTOMY     WISDOM TOOTH EXTRACTION  2004    x4   The following portions of the patient's history were reviewed and updated as appropriate: allergies, current medications, past family history, past medical history, past social history, past surgical history and problem list.   Health Maintenance:      Component Value Date/Time   DIAGPAP  02/02/2024 1512    - Negative for intraepithelial lesion or malignancy (NILM)   DIAGPAP  09/09/2020 1411    - Negative for intraepithelial lesion or malignancy (NILM)   HPVHIGH Negative 02/02/2024 1512   HPVHIGH Negative 09/09/2020 1411   ADEQPAP  02/02/2024 1512    Satisfactory for evaluation; transformation zone component ABSENT.   ADEQPAP  09/09/2020 1411    Satisfactory for evaluation; transformation zone component PRESENT.     Review of Systems:  Pertinent items noted in HPI and remainder of comprehensive ROS otherwise negative.  Physical Exam:   General:  Alert, oriented and cooperative. Patient appears to be in no acute distress.  Mental Status: Normal mood and affect. Normal behavior. Normal judgment and thought content.   Respiratory: Normal respiratory effort, no problems with respiration noted  Rest of physical exam deferred due to type of encounter  Labs and Imaging Results for orders placed or performed during the hospital encounter of 03/01/24 (from the past 2 weeks)  hCG, serum, qualitative   Collection Time: 03/01/24 10:54 AM  Result Value Ref Range   Preg, Serum NEGATIVE NEGATIVE  CBC   Collection Time: 03/01/24 10:54 AM  Result Value Ref Range   WBC 3.4 (L) 4.0 - 10.5 K/uL   RBC 4.41 3.87 - 5.11 MIL/uL   Hemoglobin 13.2 12.0 - 15.0 g/dL   HCT 59.2 63.9 - 53.9 %   MCV 92.3 80.0 - 100.0 fL   MCH 29.9 26.0 - 34.0 pg   MCHC 32.4 30.0 - 36.0 g/dL   RDW 86.2 88.4 - 84.4 %    Platelets 238 150 - 400 K/uL   nRBC 0.0 0.0 - 0.2 %  Basic metabolic panel   Collection Time: 03/01/24 10:54 AM  Result Value Ref Range   Sodium 137 135 - 145 mmol/L   Potassium 4.0 3.5 - 5.1 mmol/L   Chloride 105 98 - 111 mmol/L   CO2 24 22 - 32 mmol/L   Glucose, Bld 97 70 - 99 mg/dL   BUN 10 6 - 20 mg/dL   Creatinine, Ser 9.13 0.44 - 1.00 mg/dL   Calcium 8.9 8.9 - 89.6 mg/dL   GFR, Estimated >39 >39 mL/min   Anion gap 8 5 - 15  Protime-INR   Collection Time: 03/01/24 12:30 PM  Result Value Ref Range   Prothrombin Time 13.4 11.4 - 15.2 seconds   INR 1.0 0.8 - 1.2   MM 3D SCREENING MAMMOGRAM BILATERAL BREAST Result Date: 03/02/2024 CLINICAL DATA:  Screening. EXAM: DIGITAL SCREENING BILATERAL MAMMOGRAM WITH TOMOSYNTHESIS AND CAD TECHNIQUE: Bilateral screening digital craniocaudal and mediolateral oblique mammograms were obtained. Bilateral screening digital breast tomosynthesis was performed. The images were evaluated with computer-aided detection. COMPARISON:  Previous exam(s). ACR Breast Density Category c: The breasts are heterogeneously dense, which may obscure small masses. FINDINGS: There are no findings suspicious for malignancy. IMPRESSION: No mammographic evidence of malignancy. A result letter of this screening mammogram will be mailed directly to the patient. RECOMMENDATION: Screening mammogram in one year. (Code:SM-B-01Y) BI-RADS CATEGORY  1: Negative. Electronically Signed   By: Toribio Agreste M.D.   On: 03/02/2024 09:57   US  Pelvis Complete Result Date: 03/01/2024 CLINICAL DATA:  Chronic pelvic pain, bilateral complex ovarian cysts EXAM: TRANSABDOMINAL AND TRANSVAGINAL ULTRASOUND OF PELVIS DOPPLER ULTRASOUND OF OVARIES TECHNIQUE: Both transabdominal and transvaginal ultrasound examinations of the pelvis were performed. Transabdominal technique was performed for global imaging of the pelvis including uterus, ovaries, adnexal regions, and pelvic cul-de-sac. It was necessary to  proceed with endovaginal exam following the transabdominal exam to visualize the ovaries. Color and duplex Doppler ultrasound was utilized to evaluate blood flow to the ovaries. COMPARISON:  MR pelvis February 28, 2024, pelvic ultrasound January 19, 2024 FINDINGS: Uterus Measurements: 9.8 x 4.5 x 6.4 cm = volume: 147 mL. Heterogeneous myometrium with Redmond blind artifact suggestive of adenomyosis. Increased vascularity of the myometrium. No discrete measurable fibroid on current exam. Endometrium Thickness: 5.3 mm.  No focal abnormality visualized. Right ovary . Measurements: 4.1 x 2.9 x 3.1 cm = volume: 18.9 mL. Anechoic cystic structure measuring 2.7 x 2.2 x 2.5 cm with hairline thin internal septations, previously measured 4.3 x 3.8 cm. No significant recess sclerotic. Left ovary Measurements: 3.6 x 2.4 x 2.7 cm = volume: 11.9 mL. Complex cystic lesion measuring 2.6 cm with low-level echoes, previously measured 3.4 x 3.2 cm. The previously seen smaller cystic lesion measuring 1.3 cm is not visualized on current ultrasound. No significant increased vascularity Pulsed Doppler evaluation  of both ovaries demonstrates normal low-resistance arterial and venous waveforms. Other findings No significant free fluid. IMPRESSION: Coarse heterogeneous myometrium with suggestion of adenomyosis/myomatosis. Please refer to recent MRI pelvis for further details. Bilateral complex cystic ovarian lesions, smaller to prior and better characterized on MRI and likely representing cysts containing proteinaceous material/blood products likely due to endometriomas . Electronically Signed   By: Megan  Zare M.D.   On: 03/01/2024 15:30   US  Transvaginal Non-OB Result Date: 03/01/2024 CLINICAL DATA:  Chronic pelvic pain, bilateral complex ovarian cysts EXAM: TRANSABDOMINAL AND TRANSVAGINAL ULTRASOUND OF PELVIS DOPPLER ULTRASOUND OF OVARIES TECHNIQUE: Both transabdominal and transvaginal ultrasound examinations of the pelvis were performed.  Transabdominal technique was performed for global imaging of the pelvis including uterus, ovaries, adnexal regions, and pelvic cul-de-sac. It was necessary to proceed with endovaginal exam following the transabdominal exam to visualize the ovaries. Color and duplex Doppler ultrasound was utilized to evaluate blood flow to the ovaries. COMPARISON:  MR pelvis February 28, 2024, pelvic ultrasound January 19, 2024 FINDINGS: Uterus Measurements: 9.8 x 4.5 x 6.4 cm = volume: 147 mL. Heterogeneous myometrium with Redmond blind artifact suggestive of adenomyosis. Increased vascularity of the myometrium. No discrete measurable fibroid on current exam. Endometrium Thickness: 5.3 mm.  No focal abnormality visualized. Right ovary . Measurements: 4.1 x 2.9 x 3.1 cm = volume: 18.9 mL. Anechoic cystic structure measuring 2.7 x 2.2 x 2.5 cm with hairline thin internal septations, previously measured 4.3 x 3.8 cm. No significant recess sclerotic. Left ovary Measurements: 3.6 x 2.4 x 2.7 cm = volume: 11.9 mL. Complex cystic lesion measuring 2.6 cm with low-level echoes, previously measured 3.4 x 3.2 cm. The previously seen smaller cystic lesion measuring 1.3 cm is not visualized on current ultrasound. No significant increased vascularity Pulsed Doppler evaluation of both ovaries demonstrates normal low-resistance arterial and venous waveforms. Other findings No significant free fluid. IMPRESSION: Coarse heterogeneous myometrium with suggestion of adenomyosis/myomatosis. Please refer to recent MRI pelvis for further details. Bilateral complex cystic ovarian lesions, smaller to prior and better characterized on MRI and likely representing cysts containing proteinaceous material/blood products likely due to endometriomas . Electronically Signed   By: Megan  Zare M.D.   On: 03/01/2024 15:30   US  Art/Ven Flow Abd Pelv Doppler Result Date: 03/01/2024 CLINICAL DATA:  Chronic pelvic pain, bilateral complex ovarian cysts EXAM: TRANSABDOMINAL AND  TRANSVAGINAL ULTRASOUND OF PELVIS DOPPLER ULTRASOUND OF OVARIES TECHNIQUE: Both transabdominal and transvaginal ultrasound examinations of the pelvis were performed. Transabdominal technique was performed for global imaging of the pelvis including uterus, ovaries, adnexal regions, and pelvic cul-de-sac. It was necessary to proceed with endovaginal exam following the transabdominal exam to visualize the ovaries. Color and duplex Doppler ultrasound was utilized to evaluate blood flow to the ovaries. COMPARISON:  MR pelvis February 28, 2024, pelvic ultrasound January 19, 2024 FINDINGS: Uterus Measurements: 9.8 x 4.5 x 6.4 cm = volume: 147 mL. Heterogeneous myometrium with Redmond blind artifact suggestive of adenomyosis. Increased vascularity of the myometrium. No discrete measurable fibroid on current exam. Endometrium Thickness: 5.3 mm.  No focal abnormality visualized. Right ovary . Measurements: 4.1 x 2.9 x 3.1 cm = volume: 18.9 mL. Anechoic cystic structure measuring 2.7 x 2.2 x 2.5 cm with hairline thin internal septations, previously measured 4.3 x 3.8 cm. No significant recess sclerotic. Left ovary Measurements: 3.6 x 2.4 x 2.7 cm = volume: 11.9 mL. Complex cystic lesion measuring 2.6 cm with low-level echoes, previously measured 3.4 x 3.2 cm. The previously seen smaller cystic  lesion measuring 1.3 cm is not visualized on current ultrasound. No significant increased vascularity Pulsed Doppler evaluation of both ovaries demonstrates normal low-resistance arterial and venous waveforms. Other findings No significant free fluid. IMPRESSION: Coarse heterogeneous myometrium with suggestion of adenomyosis/myomatosis. Please refer to recent MRI pelvis for further details. Bilateral complex cystic ovarian lesions, smaller to prior and better characterized on MRI and likely representing cysts containing proteinaceous material/blood products likely due to endometriomas . Electronically Signed   By: Megan  Zare M.D.   On:  03/01/2024 15:30   MR PELVIS W WO CONTRAST Result Date: 03/01/2024 CLINICAL DATA:  Complex ovarian cyst follow-up EXAM: MRI PELVIS WITHOUT AND WITH CONTRAST TECHNIQUE: Multiplanar multisequence MR imaging of the pelvis was performed both before and after administration of intravenous contrast. CONTRAST:  6ml Vueway  iv COMPARISON:  Ultrasound abdomen January 19, 2024, March 01, 2024. FINDINGS: Urinary Tract:  Bladder is under distended otherwise unremarkable. Bowel:  Visualized bowel loops are unremarkable Vascular/Lymphatic: No pathologically enlarged lymph nodes. No significant vascular abnormality seen. Reproductive: Anteverted uterus measures 10 1 x 5.1 x 5.4 cm. Endometrial thickness measures 4.6 mm. C-section a scar identified in lower uterine segment. Heterogeneous myometrial signal intensity. There is a suggestion of intramural fibroid along the right lateral uterine wall measuring 1.1 cm (3/24) without significant postcontrast enhancement. Additional subcentimeter intramural fibroids are suspected within the uterine fundus without significant postcontrast enhancement (11/46, 47). No definite T2 hyperintensity /cystic change . Right ovary measures 4.3 x 3.2 cm. There is a multi septated T2 hyperintense cystic lesion measuring 2.8 x 3.7 cm containing hairline internal septations and a T1 hyperintense/ T2 hypointense component with hemosiderin rim measuring 1.3 cm (5/8). This lesion was previously measured 4.3 x 3.8 cm. No abnormal enhancement on postcontrast images. No signal drop on fat sat or out of phase sequence . Left ovary measures 4.2 x 2.8 cm. There is a T1 hyperintense T2 shading cystic lesion measuring 2.1 x 2.2 cm without postcontrast enhancement, this lesion was previously measured 3.4 x 3.2 cm. A smaller simple T2 hyperintense follicle measuring 1.5 cm. No signal drop on fat sat or out of phase sequence . Other:  Trace pelvic free.  Mild rectus diastasis. Musculoskeletal: No suspicious bone lesions  identified. IMPRESSION: Bilateral complex ovarian cysts with characteristics most consistent with endometriomas, decreased in size to prior. Coarse heterogeneous myometrium with suggestion of uterine fibroids. Early coexisting adenomyosis cannot be excluded. Trace pelvic free fluid. Electronically Signed   By: Megan  Zare M.D.   On: 03/01/2024 15:27       Assessment and Plan:     1. Chronic pelvic pain in female (Primary) Likely multi-factorial. Imaging suggestive of endometriosis, adenomyosis and fibroids. Reviewed etiology of all including medical and surgical management of all. PFPT has been scheduled, continue as needed baclofen  as well as NSAIDs and Tylenol .  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   - norethindrone  (AYGESTIN ) 5 MG tablet; Take 1 tablet (5 mg total) by mouth daily.  Dispense: 30 tablet; Refill: 6  2. Abnormal uterine bleeding (AUB) 3. Dysmenorrhea  Discussed management options for abnormal uterine bleeding including NSAIDs (Naproxen ), tranexamic acid (Lysteda), oral progesterone, Depo Provera , Levonogestrel IUD, endometrial ablation or hysterectomy as definitive surgical management.  Discussed risks and benefits of each method.    - Given reports of passing large clots, discussed endometrial sampling which can be done in office or in the operating room. If decision made to  go into OR, can consider concomitant laparoscopy. After discussion of options, tentatively plan for EMB in office. Also requesting reasonable accommodations given that medication helps but often makes her groggy and unable to drive or move as much and would need either flexible hours or ability to work hybrid.   - Patient interested in uterine sparing options, as she is interested in possibility of pregnancy in the future despite history of tubal ligation. - We discussed surgical/procedural options available: RFA (I.e.  Sonata), COLOMBIA, and hysterectomy. We discussed the risks and benefits for each of these specific procedures. For COLOMBIA, recommended preop MRI and referral to interventional radiology.  For the sonata, reviewed that we would need to sign a special consent form for this procedure. We discussed the types and sizes of fibroids that are candidates for hysteroscopic resection of fibroids as well.  - norethindrone  (AYGESTIN ) 5 MG tablet; Take 1 tablet (5 mg total) by mouth daily.  Dispense: 30 tablet; Refill: 6       I discussed the assessment and treatment plan with the patient. The patient was provided an opportunity to ask questions and all were answered. The patient agreed with the plan and demonstrated an understanding of the instructions.   The patient was advised to call back or seek an in-person evaluation/go to the ED if the symptoms worsen or if the condition fails to improve as anticipated.  I provided 30 minutes of face-to-face time during this encounter. I also spent 10 minutes dedicated to the care of this patient including pre-visit review of records, post visit ordering of medications and appropriate tests or procedures, coordinating care and documenting this visit encounter.    Carter Quarry, MD Center for [employer], [facility]

## 2024-03-10 NOTE — Patient Instructions (Addendum)
 Sonata - procedure to treat fibroids directly (ok to get pregnant afterwards)   Endometrial ablation - burn the lining of the uterus (should not get pregnant afterwards)  Hysteroscopy, dilation and curettage - camera to look into uterus and collect sample/tissue  Laparoscopy - small incision in the abdomen and use a camera to look in the abdomen. Used to diagnose endometriosis by collecting biopsies, can remove ovarian cysts  Ovarian cystectomy - removal of ovarian cysts, this is done laparoscopically

## 2024-03-16 ENCOUNTER — Encounter: Payer: Self-pay | Admitting: Obstetrics and Gynecology

## 2024-03-21 ENCOUNTER — Ambulatory Visit: Admitting: Obstetrics and Gynecology

## 2024-03-29 ENCOUNTER — Other Ambulatory Visit: Payer: Self-pay

## 2024-03-29 ENCOUNTER — Encounter: Payer: Self-pay | Admitting: Physical Therapy

## 2024-03-29 ENCOUNTER — Ambulatory Visit: Attending: Obstetrics and Gynecology | Admitting: Physical Therapy

## 2024-03-29 DIAGNOSIS — R102 Pelvic and perineal pain: Secondary | ICD-10-CM | POA: Insufficient documentation

## 2024-03-29 DIAGNOSIS — R293 Abnormal posture: Secondary | ICD-10-CM | POA: Diagnosis not present

## 2024-03-29 DIAGNOSIS — R279 Unspecified lack of coordination: Secondary | ICD-10-CM | POA: Diagnosis not present

## 2024-03-29 DIAGNOSIS — M62838 Other muscle spasm: Secondary | ICD-10-CM | POA: Diagnosis not present

## 2024-03-29 DIAGNOSIS — G8929 Other chronic pain: Secondary | ICD-10-CM | POA: Insufficient documentation

## 2024-03-29 NOTE — Therapy (Signed)
 OUTPATIENT PHYSICAL THERAPY FEMALE PELVIC EVALUATION   Patient Name: Frances Cohen MRN: [id number] DOB:Jan 01, 1986, 38 y.o., female Today's Date: 03/29/2024  END OF SESSION:  PT End of Session - 03/29/24 1536     Visit Number 1    Number of Visits 8    Date for PT Re-Evaluation 05/24/24    Authorization Type BC[employer]    PT Start Time 0245    PT Stop Time 0330    PT Time Calculation (min) 45 min    Activity Tolerance Patient tolerated treatment well    Behavior During Therapy [facility] for tasks assessed/performed          Past Medical History:  Diagnosis Date   Abnormal Pap smear 2003   Fibroid    GERD (gastroesophageal reflux disease)    using tums    Hernia 09-2011   Migraine    SAB (spontaneous abortion) 12/2010   TWO   Past Surgical History:  Procedure Laterality Date   CESAREAN SECTION     CESAREAN SECTION  12/10/2011   Procedure: CESAREAN SECTION;  Surgeon: Winton Felt, MD;  Location: [facility];  Service: Gynecology;  Laterality: N/A;   CESAREAN SECTION WITH BILATERAL TUBAL LIGATION Bilateral 01/01/2015   Procedure: CESAREAN SECTION WITH BILATERAL TUBAL LIGATION;  Surgeon: Burnard VEAR Pate, MD;  Location: [facility];  Service: Obstetrics;  Laterality: Bilateral;   TONSILLECTOMY     WISDOM TOOTH EXTRACTION  2004    x4   Patient Active Problem List   Diagnosis Date Noted   Femoral hernia 10/13/2011   PCP: Cyndi Shaver, PA-C  REFERRING PROVIDER: Ajewole, Christana, MD   REFERRING DIAG: R10.2,G89.29 (ICD-10-CM) - Chronic pelvic pain in female  THERAPY DIAG:  Other muscle spasm  Unspecified lack of coordination  Abnormal posture  Rationale for Evaluation and Treatment: Rehabilitation  ONSET DATE: may of this year (2025)  SUBJECTIVE:                                                                                                                                                                                           SUBJECTIVE STATEMENT: In may of this year,  patient began feeling pelvic pain consistently at work. She was later diagnosed with endometriomas and adenomyosis.This pain is debilitating in nature when it is present. She is taking muscle relaxers for pain but tries to avoid this because it makes her groggy and she has to miss work when she takes.  Fluid intake: 2 water bottles per day, no caffeine, herbal tea, lemonade sometimes   PAIN:  Are you having pain? Yes NPRS scale: 3/10, 9/10 when pain is bad  Pain  location: Internal, Deep, Left, Vaginal, and Anterior  Pain type: aching, dull, and sharp Pain description: intermittent   Aggravating factors: exercise originally aggravated this pain, stress  Relieving factors: muscle relaxers   PRECAUTIONS: None  RED FLAGS: None   WEIGHT BEARING RESTRICTIONS: No  FALLS:  Has patient fallen in last 6 months? No  OCCUPATION: works in TEFL teacher, is up and down on feet   ACTIVITY LEVEL : not currently in a routine   PLOF: Independent  PATIENT GOALS: less pain, to be able to workout without pain being a limiting factor   PERTINENT HISTORY:  Hx: fibroids, ovarian cysts, C-sectionx3 with bilateral tubal ligation with 2016 surgery, muscle relaxer for pelvic pain, MRI ordered  Sexual abuse: No  BOWEL MOVEMENT: Pain with bowel movement: No Type of bowel movement:Type (Bristol Stool Scale) 2-3, Frequency every 2 days roughly, Strain no, and Splinting no Fully empty rectum: Yes:   Leakage: No Pads: No Fiber supplement/laxative No  URINATION: Pain with urination: No Fully empty bladder: Yes:   Stream: Strong Urgency: Yes  Frequency: within normal limits - gets up sometimes at night to pee  Leakage: none Pads: No  INTERCOURSE: not currently sexually active   Ability to have vaginal penetration Yes  Pain with intercourse: none DrynessNo Climax: yes  Marinoff Scale: 0/3  PREGNANCY: Vaginal deliveries 0 Tearing No Episiotomy No C-section deliveries 3 - 2016 was most  recent  Currently pregnant No  PROLAPSE: None  OBJECTIVE:  Note: Objective measures were completed at Evaluation unless otherwise noted.  PATIENT SURVEYS:  PFIQ-7: 34  COGNITION: Overall cognitive status: Within functional limits for tasks assessed     SENSATION: Light touch: Appears intact  LUMBAR SPECIAL TESTS:  Single leg stance test: Positive  FUNCTIONAL TESTS:  Squat: bilateral dynamic knee valgus with loading ,general lumbopelvic stiffness present with transferring   GAIT: Assistive device utilized: None Comments: mild trendelenburg gait pattern with ambulation   POSTURE: rounded shoulders and forward head  LUMBARAROM/PROM: within normal limits for all motions bilaterally with no pain   LOWER EXTREMITY ROM: within normal limits for all motions bilaterally with no pain   LOWER EXTREMITY MMT: 4/5 bilateral knees and hips grossly   PALPATION:   General: no tenderness to palpation of bilateral adductors or hip flexors in supine  Pelvic Alignment: within normal limits   Abdominal: upper chest breathing, abdominal bracing, and decreased lower rib excursion with inhalation at rest                 External Perineal Exam: mild dryness with sufficient clitoral hood mobility present                              Internal Pelvic Floor: patient fully consents to today's internal examination. She demonstrates increased tension at the introitus bilaterally, no palpable trigger points. Examination revealed significant muscle tension in bilateral aspects of superficial and deep pelvic floor musculature, more on left side than right side. She has a strong pelvic floor contraction but demonstrates difficulty coordinating the musculature to relax after contracting the muscles. No pain during or following examination today.  Patient confirms identification and approves PT to assess internal pelvic floor and treatment Yes No emotional/communication barriers or cognitive limitation.  Patient is motivated to learn. Patient understands and agrees with treatment goals and plan. PT explains patient will be examined in standing, sitting, and lying down to see how their muscles and joints work.  When they are ready, they will be asked to remove their underwear so PT can examine their perineum. The patient is also given the option of providing their own chaperone as one is not provided in our facility. The patient also has the right and is explained the right to defer or refuse any part of the evaluation or treatment including the internal exam. With the patient's consent, PT will use one gloved finger to gently assess the muscles of the pelvic floor, seeing how well it contracts and relaxes and if there is muscle symmetry. After, the patient will get dressed and PT and patient will discuss exam findings and plan of care. PT and patient discuss plan of care, schedule, attendance policy and HEP activities.  PELVIC MMT:   MMT eval  Vaginal 4/5, 5 quick flicks, 5 second hold with cues required to relax   Internal Anal Sphincter   External Anal Sphincter   Puborectalis   Diastasis Recti   (Blank rows = not tested)    TONE: High in bilateral aspects of superficial and deep pelvic floor musculature   PROLAPSE: Patient demonstrates grade 1-2 anterior vaginal wall laxity in supine   TODAY'S TREATMENT:                                                                                                                              DATE:   EVAL 03/29/24: Examination completed, findings reviewed, pt educated on POC. Pt motivated to participate in PT and agreeable to attempt recommendations.   For all possible CPT codes, reference the Planned Interventions line above.     Check all conditions that are expected to impact treatment: {Conditions expected to impact treatment:None of these apply   If treatment provided at initial evaluation, no treatment charged due to lack of authorization.      PATIENT EDUCATION:  Education details: relative anatomy and the connection between the diaphragm and pelvic floor musculature, how endometriosis and adenomyosis affect pelvic pain  Person educated: Patient Education method: Explanation, Demonstration, Tactile cues, Verbal cues, and Handouts Education comprehension: verbalized understanding, returned demonstration, verbal cues required, tactile cues required, and needs further education  HOME EXERCISE PROGRAM: Access Code: [email or web address] URL: [email or web address] Date: 03/29/2024 Prepared by: Celena Domino  Exercises - Supine Pelvic Floor Contraction  - 1 x daily - 7 x weekly - 3 sets - 10 reps  ASSESSMENT:  CLINICAL IMPRESSION: Patient is a 38 y.o. female  who was seen today for physical therapy evaluation and treatment for chronic pelvic pain. In may of this year, patient began feeling pelvic pain consistently at work. She was later diagnosed with endometriomas and adenomyosis, and has a history of ovarian cysts/fibroids.This pain is debilitating in nature when it is present, 9/10. She is taking muscle relaxers for pain but tries to avoid this because it makes her groggy and she has to miss work when she takes. Patient fully consents to today's internal examination. She demonstrates increased tension  at the introitus bilaterally, no palpable trigger points. Examination revealed significant muscle tension in bilateral aspects of superficial and deep pelvic floor musculature, more on left side than right side. She has a strong pelvic floor contraction but demonstrates difficulty coordinating the musculature to relax after contracting the muscles. She demonstrates significant stiffness in the pelvic floor, most likely due to guarding from pain response in the past. No pain during or following examination today. Overall, pt tolerated session well and Pt would benefit from additional PT to further address deficits.    OBJECTIVE  IMPAIRMENTS: decreased coordination, decreased endurance, decreased mobility, decreased ROM, decreased strength, and pain.   ACTIVITY LIMITATIONS: carrying, lifting, bending, sitting, standing, squatting, stairs, and transfers  PARTICIPATION LIMITATIONS: driving, shopping, community activity, occupation, and yard work  PERSONAL FACTORS: Age, Past/current experiences, Time since onset of injury/illness/exacerbation, and hx of uterine fibroids, ovarian cysts, adenomyosis, and endometriomas are also affecting patient's functional outcome.   REHAB POTENTIAL: Good  CLINICAL DECISION MAKING: Stable/uncomplicated  EVALUATION COMPLEXITY: Low   GOALS: Goals reviewed with patient? Yes  SHORT TERM GOALS: Target date: 04/26/2024  Pt will be independent with HEP.  Baseline: Goal status: INITIAL  2.  Pt will be independent with diaphragmatic breathing and down training activities in order to improve pelvic floor relaxation. Baseline:  Goal status: INITIAL  3.  Pt will be able to correctly perform diaphragmatic breathing and appropriate pressure management in order to prevent worsening vaginal wall laxity and improve pelvic floor A/ROM.  Baseline:  Goal status: INITIAL  4.  Pt will be independent with use of squatty potty, relaxed toileting mechanics, and improved bowel movement techniques in order to increase ease of bowel movements and complete evacuation.  Baseline:  Goal status: INITIAL  LONG TERM GOALS: Target date: 09/26/2024  Pt will be independent with advanced HEP.  Baseline:  Goal status: INITIAL  2.  Pt to demonstrate improved coordination of pelvic floor and breathing mechanics with 10# squat with appropriate synergistic patterns to decrease pain and leakage at least 75% of the time for improved ability to complete a 30 minute workout with strain at pelvic floor and symptoms.   Baseline:  Goal status: INITIAL  3.  Pt will report 75% reduction of pain due to improvements in  posture, strength, and muscle length in pelvic floor musculature to decrease painful spasms while working.  Baseline:  Goal status: INITIAL  PLAN:  PT FREQUENCY: 1-2x/week  PT DURATION: 6 months  PLANNED INTERVENTIONS: 97110-Therapeutic exercises, 97530- Therapeutic activity, 97112- Neuromuscular re-education, 97535- Self Care, [id number]- Manual therapy, Patient/Family education, Taping, Joint mobilization, Spinal mobilization, Scar mobilization, Cryotherapy, and Moist heat  PLAN FOR NEXT SESSION: continued pelvic floor AROM training in seated position, introduce downtraining stretches, manual to abdomen, strengthening of hips and core, discuss wand use for management of pelvic pain   Celena JAYSON Domino, PT 03/29/2024, 3:37 PM

## 2024-04-11 ENCOUNTER — Ambulatory Visit: Payer: Self-pay | Admitting: Physical Therapy

## 2024-04-11 DIAGNOSIS — M62838 Other muscle spasm: Secondary | ICD-10-CM | POA: Diagnosis not present

## 2024-04-11 DIAGNOSIS — R279 Unspecified lack of coordination: Secondary | ICD-10-CM | POA: Diagnosis not present

## 2024-04-11 DIAGNOSIS — R102 Pelvic and perineal pain: Secondary | ICD-10-CM | POA: Diagnosis not present

## 2024-04-11 DIAGNOSIS — G8929 Other chronic pain: Secondary | ICD-10-CM | POA: Diagnosis not present

## 2024-04-11 DIAGNOSIS — R293 Abnormal posture: Secondary | ICD-10-CM

## 2024-04-11 NOTE — Therapy (Signed)
 OUTPATIENT PHYSICAL THERAPY FEMALE PELVIC TREATMENT   Patient Name: Frances Cohen MRN: [id number] DOB:Aug 07, 1985, 38 y.o., female Today's Date: 04/11/2024  END OF SESSION:  PT End of Session - 04/11/24 1146     Visit Number 2    Number of Visits 8    Date for PT Re-Evaluation 05/24/24    Authorization Type [employer]    PT Start Time 1110    PT Stop Time 1145    PT Time Calculation (min) 35 min    Activity Tolerance Patient tolerated treatment well    Behavior During Therapy [facility] for tasks assessed/performed           Past Medical History:  Diagnosis Date   Abnormal Pap smear 2003   Fibroid    GERD (gastroesophageal reflux disease)    using tums    Hernia 09-2011   Migraine    SAB (spontaneous abortion) 12/2010   TWO   Past Surgical History:  Procedure Laterality Date   CESAREAN SECTION     CESAREAN SECTION  12/10/2011   Procedure: CESAREAN SECTION;  Surgeon: Winton Felt, MD;  Location: [facility];  Service: Gynecology;  Laterality: N/A;   CESAREAN SECTION WITH BILATERAL TUBAL LIGATION Bilateral 01/01/2015   Procedure: CESAREAN SECTION WITH BILATERAL TUBAL LIGATION;  Surgeon: Burnard VEAR Pate, MD;  Location: [facility];  Service: Obstetrics;  Laterality: Bilateral;   TONSILLECTOMY     WISDOM TOOTH EXTRACTION  2004    x4   Patient Active Problem List   Diagnosis Date Noted   Femoral hernia 10/13/2011   PCP: Cyndi Shaver, PA-C  REFERRING PROVIDER: Ajewole, Christana, MD   REFERRING DIAG: R10.2,G89.29 (ICD-10-CM) - Chronic pelvic pain in female  THERAPY DIAG:  Other muscle spasm  Unspecified lack of coordination  Abnormal posture  Rationale for Evaluation and Treatment: Rehabilitation  ONSET DATE: may of this year (2025)  SUBJECTIVE:                                                                                                                                                                                           SUBJECTIVE STATEMENT: Patient reports that  she had some pressure/pain after internal exam last session. This lasted a few days and she managed it with Aleve . 2/10 pain today at rest. Last Tuesday she went to a baseball game and was in pain for 2 days after. She then visited a friend on Saturday and did some shopping/walking and took breaks and had no pain. No urinary or bowel related concerns to report - feeling more regular since starting more water.   Eval: In may of this year, patient began  feeling pelvic pain consistently at work. She was later diagnosed with endometriomas and adenomyosis.This pain is debilitating in nature when it is present. She is taking muscle relaxers for pain but tries to avoid this because it makes her groggy and she has to miss work when she takes.  Fluid intake: 2 water bottles per day, no caffeine, herbal tea, lemonade sometimes   PAIN:  Are you having pain? Yes NPRS scale: 3/10, 9/10 when pain is bad  Pain location: Internal, Deep, Left, Vaginal, and Anterior  Pain type: aching, dull, and sharp Pain description: intermittent   Aggravating factors: exercise originally aggravated this pain, stress  Relieving factors: muscle relaxers   PRECAUTIONS: None  RED FLAGS: None   WEIGHT BEARING RESTRICTIONS: No  FALLS:  Has patient fallen in last 6 months? No  OCCUPATION: works in TEFL teacher, is up and down on feet   ACTIVITY LEVEL : not currently in a routine   PLOF: Independent  PATIENT GOALS: less pain, to be able to workout without pain being a limiting factor   PERTINENT HISTORY:  Hx: fibroids, ovarian cysts, C-sectionx3 with bilateral tubal ligation with 2016 surgery, muscle relaxer for pelvic pain, MRI ordered  Sexual abuse: No  BOWEL MOVEMENT: Pain with bowel movement: No Type of bowel movement:Type (Bristol Stool Scale) 2-3, Frequency every 2 days roughly, Strain no, and Splinting no Fully empty rectum: Yes:   Leakage: No Pads: No Fiber supplement/laxative No  URINATION: Pain  with urination: No Fully empty bladder: Yes:   Stream: Strong Urgency: Yes  Frequency: within normal limits - gets up sometimes at night to pee  Leakage: none Pads: No  INTERCOURSE: not currently sexually active   Ability to have vaginal penetration Yes  Pain with intercourse: none DrynessNo Climax: yes  Marinoff Scale: 0/3  PREGNANCY: Vaginal deliveries 0 Tearing No Episiotomy No C-section deliveries 3 - 2016 was most recent  Currently pregnant No  PROLAPSE: None  OBJECTIVE:  Note: Objective measures were completed at Evaluation unless otherwise noted.  PATIENT SURVEYS:  PFIQ-7: 34  COGNITION: Overall cognitive status: Within functional limits for tasks assessed     SENSATION: Light touch: Appears intact  LUMBAR SPECIAL TESTS:  Single leg stance test: Positive  FUNCTIONAL TESTS:  Squat: bilateral dynamic knee valgus with loading ,general lumbopelvic stiffness present with transferring   GAIT: Assistive device utilized: None Comments: mild trendelenburg gait pattern with ambulation   POSTURE: rounded shoulders and forward head  LUMBARAROM/PROM: within normal limits for all motions bilaterally with no pain   LOWER EXTREMITY ROM: within normal limits for all motions bilaterally with no pain   LOWER EXTREMITY MMT: 4/5 bilateral knees and hips grossly   PALPATION:   General: no tenderness to palpation of bilateral adductors or hip flexors in supine  Pelvic Alignment: within normal limits   Abdominal: upper chest breathing, abdominal bracing, and decreased lower rib excursion with inhalation at rest                 External Perineal Exam: mild dryness with sufficient clitoral hood mobility present                              Internal Pelvic Floor: patient fully consents to today's internal examination. She demonstrates increased tension at the introitus bilaterally, no palpable trigger points. Examination revealed significant muscle tension in bilateral  aspects of superficial and deep pelvic floor musculature, more on left side than right  side. She has a strong pelvic floor contraction but demonstrates difficulty coordinating the musculature to relax after contracting the muscles. No pain during or following examination today.  Patient confirms identification and approves PT to assess internal pelvic floor and treatment Yes No emotional/communication barriers or cognitive limitation. Patient is motivated to learn. Patient understands and agrees with treatment goals and plan. PT explains patient will be examined in standing, sitting, and lying down to see how their muscles and joints work. When they are ready, they will be asked to remove their underwear so PT can examine their perineum. The patient is also given the option of providing their own chaperone as one is not provided in our facility. The patient also has the right and is explained the right to defer or refuse any part of the evaluation or treatment including the internal exam. With the patient's consent, PT will use one gloved finger to gently assess the muscles of the pelvic floor, seeing how well it contracts and relaxes and if there is muscle symmetry. After, the patient will get dressed and PT and patient will discuss exam findings and plan of care. PT and patient discuss plan of care, schedule, attendance policy and HEP activities.  PELVIC MMT:   MMT eval  Vaginal 4/5, 5 quick flicks, 5 second hold with cues required to relax   Internal Anal Sphincter   External Anal Sphincter   Puborectalis   Diastasis Recti   (Blank rows = not tested)    TONE: High in bilateral aspects of superficial and deep pelvic floor musculature   PROLAPSE: Patient demonstrates grade 1-2 anterior vaginal wall laxity in supine   TODAY'S TREATMENT:                                                                                                                              DATE:   EVAL 03/29/24: Examination  completed, findings reviewed, pt educated on POC. Pt motivated to participate in PT and agreeable to attempt recommendations.   For all possible CPT codes, reference the Planned Interventions line above.     Check all conditions that are expected to impact treatment: {Conditions expected to impact treatment:None of these apply   If treatment provided at initial evaluation, no treatment charged due to lack of authorization.   04/11/24: Seated pelvic floor contraction + diaphragmatic breathing  Supine butterfly stretch + diaphragmatic breathing 2x48min  Figure 4 piriformis stretch + diaphragmatic breathing 2x65min  Childs pose + diaphragmatic breathing 2x7min 4-7-8 breathing technique for vagus nerve stimulation, lateral swaying and deep humming for vagus nerve stimulation for relaxation/downtraining   PATIENT EDUCATION:  Education details: relative anatomy and the connection between the diaphragm and pelvic floor musculature, how endometriosis and adenomyosis affect pelvic pain  Person educated: Patient Education method: Explanation, Demonstration, Tactile cues, Verbal cues, and Handouts Education comprehension: verbalized understanding, returned demonstration, verbal cues required, tactile cues required, and needs further education  HOME EXERCISE PROGRAM: Access Code:  [email or web address] URL: [email or web address] Date: 04/11/2024 Prepared by: Celena Domino  Exercises - Supine Pelvic Floor Contraction  - 1 x daily - 7 x weekly - 2 sets - 10 reps - Seated Pelvic Floor Contraction  - 1 x daily - 7 x weekly - 2 sets - 10 reps - Supine Butterfly Groin Stretch  - 1 x daily - 7 x weekly - 2 sets - hold - Supine Figure 4 Piriformis Stretch  - 1 x daily - 7 x weekly - 2 sets - hold - Child's Pose Stretch  - 1 x daily - 7 x weekly - 2 sets - hold  ASSESSMENT:  CLINICAL IMPRESSION: Patient is a 38 y.o. female  who was seen today for physical therapy treatment for chronic pelvic  pain. 2/10 pain at rest today, mild in nature. Downtraining stretches introduced to help relax external pelvic floor musculature and promote vagus nerve stimulation for overall less muscle tension at rest. Overall, pt tolerated session well and Pt would benefit from additional PT to further address deficits.    OBJECTIVE IMPAIRMENTS: decreased coordination, decreased endurance, decreased mobility, decreased ROM, decreased strength, and pain.   ACTIVITY LIMITATIONS: carrying, lifting, bending, sitting, standing, squatting, stairs, and transfers  PARTICIPATION LIMITATIONS: driving, shopping, community activity, occupation, and yard work  PERSONAL FACTORS: Age, Past/current experiences, Time since onset of injury/illness/exacerbation, and hx of uterine fibroids, ovarian cysts, adenomyosis, and endometriomas are also affecting patient's functional outcome.   REHAB POTENTIAL: Good  CLINICAL DECISION MAKING: Stable/uncomplicated  EVALUATION COMPLEXITY: Low   GOALS: Goals reviewed with patient? Yes  SHORT TERM GOALS: Target date: 04/26/2024  Pt will be independent with HEP.  Baseline: Goal status: INITIAL  2.  Pt will be independent with diaphragmatic breathing and down training activities in order to improve pelvic floor relaxation. Baseline:  Goal status: INITIAL  3.  Pt will be able to correctly perform diaphragmatic breathing and appropriate pressure management in order to prevent worsening vaginal wall laxity and improve pelvic floor A/ROM.  Baseline:  Goal status: INITIAL  4.  Pt will be independent with use of squatty potty, relaxed toileting mechanics, and improved bowel movement techniques in order to increase ease of bowel movements and complete evacuation.  Baseline:  Goal status: INITIAL  LONG TERM GOALS: Target date: 09/26/2024  Pt will be independent with advanced HEP.  Baseline:  Goal status: INITIAL  2.  Pt to demonstrate improved coordination of pelvic floor and  breathing mechanics with 10# squat with appropriate synergistic patterns to decrease pain and leakage at least 75% of the time for improved ability to complete a 30 minute workout with strain at pelvic floor and symptoms.   Baseline:  Goal status: INITIAL  3.  Pt will report 75% reduction of pain due to improvements in posture, strength, and muscle length in pelvic floor musculature to decrease painful spasms while working.  Baseline:  Goal status: INITIAL  PLAN:  PT FREQUENCY: 1-2x/week  PT DURATION: 6 months  PLANNED INTERVENTIONS: 97110-Therapeutic exercises, 97530- Therapeutic activity, 97112- Neuromuscular re-education, 97535- Self Care, [id number]- Manual therapy, Patient/Family education, Taping, Joint mobilization, Spinal mobilization, Scar mobilization, Cryotherapy, and Moist heat  PLAN FOR NEXT SESSION: continued pelvic floor AROM training in seated position, introduce downtraining stretches, manual to abdomen, strengthening of hips and core, discuss wand use for management of pelvic pain   Celena JAYSON Domino, PT 04/11/2024, 11:46 AM

## 2024-04-19 ENCOUNTER — Ambulatory Visit: Payer: Self-pay | Admitting: Physical Therapy

## 2024-04-19 DIAGNOSIS — R293 Abnormal posture: Secondary | ICD-10-CM | POA: Diagnosis not present

## 2024-04-19 DIAGNOSIS — R279 Unspecified lack of coordination: Secondary | ICD-10-CM

## 2024-04-19 DIAGNOSIS — G8929 Other chronic pain: Secondary | ICD-10-CM | POA: Diagnosis not present

## 2024-04-19 DIAGNOSIS — M62838 Other muscle spasm: Secondary | ICD-10-CM

## 2024-04-19 DIAGNOSIS — R102 Pelvic and perineal pain: Secondary | ICD-10-CM | POA: Diagnosis not present

## 2024-04-19 NOTE — Therapy (Signed)
 OUTPATIENT PHYSICAL THERAPY FEMALE PELVIC TREATMENT   Patient Name: Frances Cohen MRN: [id number] DOB:16-Apr-1986, 38 y.o., female Today's Date: 04/19/2024  END OF SESSION:  PT End of Session - 04/19/24 1232     Visit Number 3    Number of Visits 8    Date for Recertification  05/24/24    Authorization Type [employer]    PT Start Time 1145    PT Stop Time 1230    PT Time Calculation (min) 45 min    Activity Tolerance Patient tolerated treatment well    Behavior During Therapy [facility] for tasks assessed/performed            Past Medical History:  Diagnosis Date   Abnormal Pap smear 2003   Fibroid    GERD (gastroesophageal reflux disease)    using tums    Hernia 09-2011   Migraine    SAB (spontaneous abortion) 12/2010   TWO   Past Surgical History:  Procedure Laterality Date   CESAREAN SECTION     CESAREAN SECTION  12/10/2011   Procedure: CESAREAN SECTION;  Surgeon: Winton Felt, MD;  Location: [facility];  Service: Gynecology;  Laterality: N/A;   CESAREAN SECTION WITH BILATERAL TUBAL LIGATION Bilateral 01/01/2015   Procedure: CESAREAN SECTION WITH BILATERAL TUBAL LIGATION;  Surgeon: Burnard VEAR Pate, MD;  Location: [facility];  Service: Obstetrics;  Laterality: Bilateral;   TONSILLECTOMY     WISDOM TOOTH EXTRACTION  2004    x4   Patient Active Problem List   Diagnosis Date Noted   Femoral hernia 10/13/2011   PCP: Cyndi Shaver, PA-C  REFERRING PROVIDER: Ajewole, Christana, MD   REFERRING DIAG: R10.2,G89.29 (ICD-10-CM) - Chronic pelvic pain in female  THERAPY DIAG:  Other muscle spasm  Unspecified lack of coordination  Abnormal posture  Rationale for Evaluation and Treatment: Rehabilitation  ONSET DATE: may of this year (2025)  SUBJECTIVE:                                                                                                                                                                                           SUBJECTIVE STATEMENT: She reports that she  is doing well today. She has been having some cool sensation feeling in the left side of the deep gluteal region and it happens randomly but it is not painful. She also has been having some lower abdominal soreness from exercises most likely. No urinary leakage or issues here. No bowel related concerns. 2/10 pain at rest today in the lower abdominal region.   Eval: In may of this year, patient began feeling pelvic pain consistently at work. She was later diagnosed  with endometriomas and adenomyosis.This pain is debilitating in nature when it is present. She is taking muscle relaxers for pain but tries to avoid this because it makes her groggy and she has to miss work when she takes.  Fluid intake: 2 water bottles per day, no caffeine, herbal tea, lemonade sometimes   PAIN:  Are you having pain? Yes NPRS scale: 3/10, 9/10 when pain is bad  Pain location: Internal, Deep, Left, Vaginal, and Anterior  Pain type: aching, dull, and sharp Pain description: intermittent   Aggravating factors: exercise originally aggravated this pain, stress  Relieving factors: muscle relaxers   PRECAUTIONS: None  RED FLAGS: None   WEIGHT BEARING RESTRICTIONS: No  FALLS:  Has patient fallen in last 6 months? No  OCCUPATION: works in TEFL teacher, is up and down on feet   ACTIVITY LEVEL : not currently in a routine   PLOF: Independent  PATIENT GOALS: less pain, to be able to workout without pain being a limiting factor   PERTINENT HISTORY:  Hx: fibroids, ovarian cysts, C-sectionx3 with bilateral tubal ligation with 2016 surgery, muscle relaxer for pelvic pain, MRI ordered  Sexual abuse: No  BOWEL MOVEMENT: Pain with bowel movement: No Type of bowel movement:Type (Bristol Stool Scale) 2-3, Frequency every 2 days roughly, Strain no, and Splinting no Fully empty rectum: Yes:   Leakage: No Pads: No Fiber supplement/laxative No  URINATION: Pain with urination: No Fully empty bladder: Yes:    Stream: Strong Urgency: Yes  Frequency: within normal limits - gets up sometimes at night to pee  Leakage: none Pads: No  INTERCOURSE: not currently sexually active   Ability to have vaginal penetration Yes  Pain with intercourse: none DrynessNo Climax: yes  Marinoff Scale: 0/3  PREGNANCY: Vaginal deliveries 0 Tearing No Episiotomy No C-section deliveries 3 - 2016 was most recent  Currently pregnant No  PROLAPSE: None  OBJECTIVE:  Note: Objective measures were completed at Evaluation unless otherwise noted.  PATIENT SURVEYS:  PFIQ-7: 34  COGNITION: Overall cognitive status: Within functional limits for tasks assessed     SENSATION: Light touch: Appears intact  LUMBAR SPECIAL TESTS:  Single leg stance test: Positive  FUNCTIONAL TESTS:  Squat: bilateral dynamic knee valgus with loading ,general lumbopelvic stiffness present with transferring   GAIT: Assistive device utilized: None Comments: mild trendelenburg gait pattern with ambulation   POSTURE: rounded shoulders and forward head  LUMBARAROM/PROM: within normal limits for all motions bilaterally with no pain   LOWER EXTREMITY ROM: within normal limits for all motions bilaterally with no pain   LOWER EXTREMITY MMT: 4/5 bilateral knees and hips grossly   PALPATION:   General: no tenderness to palpation of bilateral adductors or hip flexors in supine  Pelvic Alignment: within normal limits   Abdominal: upper chest breathing, abdominal bracing, and decreased lower rib excursion with inhalation at rest                 External Perineal Exam: mild dryness with sufficient clitoral hood mobility present                              Internal Pelvic Floor: patient fully consents to today's internal examination. She demonstrates increased tension at the introitus bilaterally, no palpable trigger points. Examination revealed significant muscle tension in bilateral aspects of superficial and deep pelvic floor  musculature, more on left side than right side. She has a strong pelvic floor contraction but demonstrates  difficulty coordinating the musculature to relax after contracting the muscles. No pain during or following examination today.  Patient confirms identification and approves PT to assess internal pelvic floor and treatment Yes No emotional/communication barriers or cognitive limitation. Patient is motivated to learn. Patient understands and agrees with treatment goals and plan. PT explains patient will be examined in standing, sitting, and lying down to see how their muscles and joints work. When they are ready, they will be asked to remove their underwear so PT can examine their perineum. The patient is also given the option of providing their own chaperone as one is not provided in our facility. The patient also has the right and is explained the right to defer or refuse any part of the evaluation or treatment including the internal exam. With the patient's consent, PT will use one gloved finger to gently assess the muscles of the pelvic floor, seeing how well it contracts and relaxes and if there is muscle symmetry. After, the patient will get dressed and PT and patient will discuss exam findings and plan of care. PT and patient discuss plan of care, schedule, attendance policy and HEP activities.  PELVIC MMT:   MMT eval  Vaginal 4/5, 5 quick flicks, 5 second hold with cues required to relax   Internal Anal Sphincter   External Anal Sphincter   Puborectalis   Diastasis Recti   (Blank rows = not tested)    TONE: High in bilateral aspects of superficial and deep pelvic floor musculature   PROLAPSE: Patient demonstrates grade 1-2 anterior vaginal wall laxity in supine   TODAY'S TREATMENT:                                                                                                                              DATE:   EVAL 03/29/24: Examination completed, findings reviewed, pt educated on  POC. Pt motivated to participate in PT and agreeable to attempt recommendations.   For all possible CPT codes, reference the Planned Interventions line above.     Check all conditions that are expected to impact treatment: {Conditions expected to impact treatment:None of these apply   If treatment provided at initial evaluation, no treatment charged due to lack of authorization.   04/11/24: Seated pelvic floor contraction + diaphragmatic breathing  Supine butterfly stretch + diaphragmatic breathing 2x41min  Figure 4 piriformis stretch + diaphragmatic breathing 2x40min  Childs pose + diaphragmatic breathing 2x83min 4-7-8 breathing technique for vagus nerve stimulation, lateral swaying and deep humming for vagus nerve stimulation for relaxation/downtraining   04/19/24: Seated pelvic floor contraction + diaphragmatic breathing  Supine butterfly stretch + diaphragmatic breathing 2x58min  Figure 4 piriformis stretch + diaphragmatic breathing 2x12min  Childs pose + diaphragmatic breathing 2x15min 4-7-8 breathing technique for vagus nerve stimulation, lateral swaying and deep humming for vagus nerve stimulation for relaxation/downtraining  Toileting mechanics education  How to breathe when passing stool to ensure full rectal clearance  How to void  and prevent urinary retention  Double voiding technique  Blow as you go technique   PATIENT EDUCATION:  Education details: relative anatomy and the connection between the diaphragm and pelvic floor musculature, how endometriosis and adenomyosis affect pelvic pain  Person educated: Patient Education method: Explanation, Demonstration, Tactile cues, Verbal cues, and Handouts Education comprehension: verbalized understanding, returned demonstration, verbal cues required, tactile cues required, and needs further education  HOME EXERCISE PROGRAM: Access Code: [email or web address] URL: [email or web address] Date: 04/11/2024 Prepared by: Celena Domino  Exercises - Supine Pelvic Floor Contraction  - 1 x daily - 7 x weekly - 2 sets - 10 reps - Seated Pelvic Floor Contraction  - 1 x daily - 7 x weekly - 2 sets - 10 reps - Supine Butterfly Groin Stretch  - 1 x daily - 7 x weekly - 2 sets - hold - Supine Figure 4 Piriformis Stretch  - 1 x daily - 7 x weekly - 2 sets - hold - Child's Pose Stretch  - 1 x daily - 7 x weekly - 2 sets - hold  ASSESSMENT:  CLINICAL IMPRESSION: Patient is a 38 y.o. female  who was seen today for physical therapy treatment for chronic pelvic pain. 2/10 pain at rest today, mild in nature. Downtraining stretches continued to help relax external pelvic floor musculature and promote vagus nerve stimulation for overall less muscle tension at rest. Education surrounding toileting mechanics went very well today and patient reports thorough understanding of all techniques. Overall, pt tolerated session well and Pt would benefit from additional PT to further address deficits.    OBJECTIVE IMPAIRMENTS: decreased coordination, decreased endurance, decreased mobility, decreased ROM, decreased strength, and pain.   ACTIVITY LIMITATIONS: carrying, lifting, bending, sitting, standing, squatting, stairs, and transfers  PARTICIPATION LIMITATIONS: driving, shopping, community activity, occupation, and yard work  PERSONAL FACTORS: Age, Past/current experiences, Time since onset of injury/illness/exacerbation, and hx of uterine fibroids, ovarian cysts, adenomyosis, and endometriomas are also affecting patient's functional outcome.   REHAB POTENTIAL: Good  CLINICAL DECISION MAKING: Stable/uncomplicated  EVALUATION COMPLEXITY: Low   GOALS: Goals reviewed with patient? Yes  SHORT TERM GOALS: Target date: 04/26/2024  Pt will be independent with HEP.  Baseline: Goal status: INITIAL  2.  Pt will be independent with diaphragmatic breathing and down training activities in order to improve pelvic floor  relaxation. Baseline:  Goal status: INITIAL  3.  Pt will be able to correctly perform diaphragmatic breathing and appropriate pressure management in order to prevent worsening vaginal wall laxity and improve pelvic floor A/ROM.  Baseline:  Goal status: INITIAL  4.  Pt will be independent with use of squatty potty, relaxed toileting mechanics, and improved bowel movement techniques in order to increase ease of bowel movements and complete evacuation.  Baseline:  Goal status: INITIAL  LONG TERM GOALS: Target date: 09/26/2024  Pt will be independent with advanced HEP.  Baseline:  Goal status: INITIAL  2.  Pt to demonstrate improved coordination of pelvic floor and breathing mechanics with 10# squat with appropriate synergistic patterns to decrease pain and leakage at least 75% of the time for improved ability to complete a 30 minute workout with strain at pelvic floor and symptoms.   Baseline:  Goal status: INITIAL  3.  Pt will report 75% reduction of pain due to improvements in posture, strength, and muscle length in pelvic floor musculature to decrease painful spasms while working.  Baseline:  Goal status: INITIAL  PLAN:  PT FREQUENCY: 1-2x/week  PT DURATION: 6 months  PLANNED INTERVENTIONS: 97110-Therapeutic exercises, 97530- Therapeutic activity, 97112- Neuromuscular re-education, 97535- Self Care, [id number]- Manual therapy, Patient/Family education, Taping, Joint mobilization, Spinal mobilization, Scar mobilization, Cryotherapy, and Moist heat  PLAN FOR NEXT SESSION: continued pelvic floor AROM training in seated position, introduce downtraining stretches, manual to abdomen, strengthening of hips and core, discuss wand use for management of pelvic pain   Celena JAYSON Domino, PT 04/19/2024, 12:32 PM

## 2024-04-19 NOTE — Patient Instructions (Signed)
 Toileting mechanics 101:   Urination  Positioning: sit all the way down on the toilet with your feet flat and torso relaxed When you sit down to pee, do NOT push the urine out, rather, let it flow naturally if you can  Double voiding technique: when you think you are done, this is the time you get permission to gently push to see if there is any remaining urine in your urethra that you can expel  Defecation  Positioning: sit all the way down on the toilet with your feet flat and torso relaxed Try using a foot stool or a squatty potty to ease evacuation of stool  When you need to push stool out, imagine you are fogging up a mirror with your breath as you exhale and gently push the stool down and out  When you think you are fully pooping, if you feel like there is any remaining stool in your rectum, try the following: Potty dance: see if you can move forward/backward/side/side to see if you can further relax your rectum and empty more  If you still feel backed up, try taking 3 DEEP BELLY BREATHS to see if you can further relax your rectum

## 2024-04-24 ENCOUNTER — Other Ambulatory Visit (HOSPITAL_BASED_OUTPATIENT_CLINIC_OR_DEPARTMENT_OTHER): Payer: Self-pay | Admitting: Obstetrics & Gynecology

## 2024-04-24 MED ORDER — CYCLOBENZAPRINE HCL 10 MG PO TABS
ORAL_TABLET | ORAL | 0 refills | Status: AC
Start: 2024-04-24 — End: ?

## 2024-04-26 ENCOUNTER — Ambulatory Visit: Payer: Self-pay | Attending: Obstetrics and Gynecology | Admitting: Physical Therapy

## 2024-04-26 DIAGNOSIS — M62838 Other muscle spasm: Secondary | ICD-10-CM | POA: Diagnosis not present

## 2024-04-26 DIAGNOSIS — R293 Abnormal posture: Secondary | ICD-10-CM | POA: Insufficient documentation

## 2024-04-26 DIAGNOSIS — R279 Unspecified lack of coordination: Secondary | ICD-10-CM | POA: Diagnosis not present

## 2024-04-26 NOTE — Therapy (Signed)
 OUTPATIENT PHYSICAL THERAPY FEMALE PELVIC discharge summary   Patient Name: Frances Cohen MRN: [id number] DOB:Sep 01, 1985, 38 y.o., female Today's Date: 04/26/2024  END OF SESSION:  PT End of Session - 04/26/24 1335     Visit Number 4    Number of Visits 4    Date for Recertification  05/24/24    Authorization Type BCBS    PT Start Time 1159    PT Stop Time 1230    PT Time Calculation (min) 31 min    Activity Tolerance Patient tolerated treatment well    Behavior During Therapy [facility] for tasks assessed/performed             Past Medical History:  Diagnosis Date   Abnormal Pap smear 2003   Fibroid    GERD (gastroesophageal reflux disease)    using tums    Hernia 09-2011   Migraine    SAB (spontaneous abortion) 12/2010   TWO   Past Surgical History:  Procedure Laterality Date   CESAREAN SECTION     CESAREAN SECTION  12/10/2011   Procedure: CESAREAN SECTION;  Surgeon: Winton Felt, MD;  Location: [facility];  Service: Gynecology;  Laterality: N/A;   CESAREAN SECTION WITH BILATERAL TUBAL LIGATION Bilateral 01/01/2015   Procedure: CESAREAN SECTION WITH BILATERAL TUBAL LIGATION;  Surgeon: Burnard VEAR Pate, MD;  Location: [facility];  Service: Obstetrics;  Laterality: Bilateral;   TONSILLECTOMY     WISDOM TOOTH EXTRACTION  2004    x4   Patient Active Problem List   Diagnosis Date Noted   Femoral hernia 10/13/2011   PCP: Cyndi Shaver, PA-C  REFERRING PROVIDER: Ajewole, Christana, MD   REFERRING DIAG: R10.2,G89.29 (ICD-10-CM) - Chronic pelvic pain in female  THERAPY DIAG:  Other muscle spasm  Unspecified lack of coordination  Abnormal posture  Rationale for Evaluation and Treatment: Rehabilitation  ONSET DATE: may of this year (2025)  SUBJECTIVE:                                                                                                                                                                                           SUBJECTIVE STATEMENT: Patient  reports that passing bowel movements has been easier and she feels like she is having complete bowel movements. 0/10 pain today in the pelvis. She has stopped taking her birth control and she started her period on Sunday. No urinary leakage or issues here. No bowel related concerns.   Eval: In may of this year, patient began feeling pelvic pain consistently at work. She was later diagnosed with endometriomas and adenomyosis.This pain is debilitating in nature when it is present. She is taking  muscle relaxers for pain but tries to avoid this because it makes her groggy and she has to miss work when she takes.  Fluid intake: 2 water bottles per day, no caffeine, herbal tea, lemonade sometimes   PAIN:  Are you having pain? Yes NPRS scale: 3/10, 9/10 when pain is bad  Pain location: Internal, Deep, Left, Vaginal, and Anterior  Pain type: aching, dull, and sharp Pain description: intermittent   Aggravating factors: exercise originally aggravated this pain, stress  Relieving factors: muscle relaxers   PRECAUTIONS: None  RED FLAGS: None   WEIGHT BEARING RESTRICTIONS: No  FALLS:  Has patient fallen in last 6 months? No  OCCUPATION: works in TEFL teacher, is up and down on feet   ACTIVITY LEVEL : not currently in a routine   PLOF: Independent  PATIENT GOALS: less pain, to be able to workout without pain being a limiting factor   PERTINENT HISTORY:  Hx: fibroids, ovarian cysts, C-sectionx3 with bilateral tubal ligation with 2016 surgery, muscle relaxer for pelvic pain, MRI ordered  Sexual abuse: No  BOWEL MOVEMENT: Pain with bowel movement: No Type of bowel movement:Type (Bristol Stool Scale) 2-3, Frequency every 2 days roughly, Strain no, and Splinting no Fully empty rectum: Yes:   Leakage: No Pads: No Fiber supplement/laxative No  URINATION: Pain with urination: No Fully empty bladder: Yes:   Stream: Strong Urgency: Yes  Frequency: within normal limits - gets up  sometimes at night to pee  Leakage: none Pads: No  INTERCOURSE: not currently sexually active   Ability to have vaginal penetration Yes  Pain with intercourse: none DrynessNo Climax: yes  Marinoff Scale: 0/3  PREGNANCY: Vaginal deliveries 0 Tearing No Episiotomy No C-section deliveries 3 - 2016 was most recent  Currently pregnant No  PROLAPSE: None  OBJECTIVE:  Note: Objective measures were completed at Evaluation unless otherwise noted.  PATIENT SURVEYS:  PFIQ-7: 34  COGNITION: Overall cognitive status: Within functional limits for tasks assessed     SENSATION: Light touch: Appears intact  LUMBAR SPECIAL TESTS:  Single leg stance test: Positive  FUNCTIONAL TESTS:  Squat: bilateral dynamic knee valgus with loading ,general lumbopelvic stiffness present with transferring   GAIT: Assistive device utilized: None Comments: mild trendelenburg gait pattern with ambulation   POSTURE: rounded shoulders and forward head  LUMBARAROM/PROM: within normal limits for all motions bilaterally with no pain   LOWER EXTREMITY ROM: within normal limits for all motions bilaterally with no pain   LOWER EXTREMITY MMT: 4/5 bilateral knees and hips grossly   PALPATION:   General: no tenderness to palpation of bilateral adductors or hip flexors in supine  Pelvic Alignment: within normal limits   Abdominal: upper chest breathing, abdominal bracing, and decreased lower rib excursion with inhalation at rest                 External Perineal Exam: mild dryness with sufficient clitoral hood mobility present                              Internal Pelvic Floor: patient fully consents to today's internal examination. She demonstrates increased tension at the introitus bilaterally, no palpable trigger points. Examination revealed significant muscle tension in bilateral aspects of superficial and deep pelvic floor musculature, more on left side than right side. She has a strong pelvic floor  contraction but demonstrates difficulty coordinating the musculature to relax after contracting the muscles. No pain during or following examination  today.  Patient confirms identification and approves PT to assess internal pelvic floor and treatment Yes No emotional/communication barriers or cognitive limitation. Patient is motivated to learn. Patient understands and agrees with treatment goals and plan. PT explains patient will be examined in standing, sitting, and lying down to see how their muscles and joints work. When they are ready, they will be asked to remove their underwear so PT can examine their perineum. The patient is also given the option of providing their own chaperone as one is not provided in our facility. The patient also has the right and is explained the right to defer or refuse any part of the evaluation or treatment including the internal exam. With the patient's consent, PT will use one gloved finger to gently assess the muscles of the pelvic floor, seeing how well it contracts and relaxes and if there is muscle symmetry. After, the patient will get dressed and PT and patient will discuss exam findings and plan of care. PT and patient discuss plan of care, schedule, attendance policy and HEP activities.  PELVIC MMT:   MMT eval  Vaginal 4/5, 5 quick flicks, 5 second hold with cues required to relax   Internal Anal Sphincter   External Anal Sphincter   Puborectalis   Diastasis Recti   (Blank rows = not tested)    TONE: High in bilateral aspects of superficial and deep pelvic floor musculature   PROLAPSE: Patient demonstrates grade 1-2 anterior vaginal wall laxity in supine   TODAY'S TREATMENT:                                                                                                                              DATE:   EVAL 03/29/24: Examination completed, findings reviewed, pt educated on POC. Pt motivated to participate in PT and agreeable to attempt  recommendations.   For all possible CPT codes, reference the Planned Interventions line above.     Check all conditions that are expected to impact treatment: {Conditions expected to impact treatment:None of these apply   If treatment provided at initial evaluation, no treatment charged due to lack of authorization.   04/11/24: Seated pelvic floor contraction + diaphragmatic breathing  Supine butterfly stretch + diaphragmatic breathing 2x61min  Figure 4 piriformis stretch + diaphragmatic breathing 2x62min  Childs pose + diaphragmatic breathing 2x36min 4-7-8 breathing technique for vagus nerve stimulation, lateral swaying and deep humming for vagus nerve stimulation for relaxation/downtraining   04/19/24: Seated pelvic floor contraction + diaphragmatic breathing  Supine butterfly stretch + diaphragmatic breathing 2x36min  Figure 4 piriformis stretch + diaphragmatic breathing 2x88min  Childs pose + diaphragmatic breathing 2x3min 4-7-8 breathing technique for vagus nerve stimulation, lateral swaying and deep humming for vagus nerve stimulation for relaxation/downtraining  Toileting mechanics education  How to breathe when passing stool to ensure full rectal clearance  How to void and prevent urinary retention  Double voiding technique  Blow as you go technique  04/25/24: - Supine Pelvic Floor Contraction  - 1 x daily - 7 x weekly - 2 sets - 10 reps - Seated Pelvic Floor Contraction  - 1 x daily - 7 x weekly - 2 sets - 10 reps - Supine Bridge with Mini Swiss Ball Between Knees  - 1 x daily - 7 x weekly - 2 sets - 10 reps - Sit to Stand with Mercer Between Knees  - 1 x daily - 7 x weekly - 2 sets - 10 reps - Seated Abdominal Press into [employer]  - 1 x daily - 7 x weekly - 2 sets - 10 reps - Supine Butterfly Groin Stretch  - 1 x daily - 7 x weekly - 2 sets - hold - Supine Figure 4 Piriformis Stretch  - 1 x daily - 7 x weekly - 2 sets - hold - Child's Pose Stretch  - 1 x daily - 7 x  weekly - 2 sets - hold - Diaphragmatic Breathing with Hips Elevated (for Pelvic Organ Prolapse)  - 1 x daily - 7 x weekly - 2 sets - 10 reps  PATIENT EDUCATION:  Education details: relative anatomy and the connection between the diaphragm and pelvic floor musculature, how endometriosis and adenomyosis affect pelvic pain  Person educated: Patient Education method: Explanation, Demonstration, Tactile cues, Verbal cues, and Handouts Education comprehension: verbalized understanding, returned demonstration, verbal cues required, tactile cues required, and needs further education  HOME EXERCISE PROGRAM: Access Code: [email or web address] URL: [email or web address] Date: 04/26/2024 Prepared by: Celena Domino  Exercises - Supine Pelvic Floor Contraction  - 1 x daily - 7 x weekly - 2 sets - 10 reps - Seated Pelvic Floor Contraction  - 1 x daily - 7 x weekly - 2 sets - 10 reps - Supine Bridge with Mini Swiss Ball Between Knees  - 1 x daily - 7 x weekly - 2 sets - 10 reps - Sit to Stand with Mercer Between Knees  - 1 x daily - 7 x weekly - 2 sets - 10 reps - Seated Abdominal Press into [employer]  - 1 x daily - 7 x weekly - 2 sets - 10 reps - Supine Butterfly Groin Stretch  - 1 x daily - 7 x weekly - 2 sets - hold - Supine Figure 4 Piriformis Stretch  - 1 x daily - 7 x weekly - 2 sets - hold - Child's Pose Stretch  - 1 x daily - 7 x weekly - 2 sets - hold - Diaphragmatic Breathing with Hips Elevated (for Pelvic Organ Prolapse)  - 1 x daily - 7 x weekly - 2 sets - 10 reps  ASSESSMENT:  CLINICAL IMPRESSION: Patient is a 38 y.o. female  who was seen today for physical therapy treatment for chronic pelvic pain. 0/10 pain at rest today, mild in nature. Downtraining stretches progressed to help relax external pelvic floor musculature and promote vagus nerve stimulation for overall less muscle tension at rest, and deep core activation taught to pt today to continue progressing exercises  independently. All goals have been met at this time and pt is appropriate for discharge.  OBJECTIVE IMPAIRMENTS: decreased coordination, decreased endurance, decreased mobility, decreased ROM, decreased strength, and pain.   ACTIVITY LIMITATIONS: carrying, lifting, bending, sitting, standing, squatting, stairs, and transfers  PARTICIPATION LIMITATIONS: driving, shopping, community activity, occupation, and yard work  PERSONAL FACTORS: Age, Past/current experiences, Time since onset of injury/illness/exacerbation, and hx of uterine fibroids, ovarian  cysts, adenomyosis, and endometriomas are also affecting patient's functional outcome.   REHAB POTENTIAL: Good  CLINICAL DECISION MAKING: Stable/uncomplicated  EVALUATION COMPLEXITY: Low   GOALS: Goals reviewed with patient? Yes  SHORT TERM GOALS: Target date: 04/26/2024  Pt will be independent with HEP.  Baseline: Goal status: GOAL MET  2.  Pt will be independent with diaphragmatic breathing and down training activities in order to improve pelvic floor relaxation. Baseline:  Goal status: GOAL MET  3.  Pt will be able to correctly perform diaphragmatic breathing and appropriate pressure management in order to prevent worsening vaginal wall laxity and improve pelvic floor A/ROM. + BaselineGOAL MET  Goal status: GOAL MET  4.  Pt will be independent with use of squatty potty, relaxed toileting mechanics, and improved bowel movement techniques in order to increase ease of bowel movements and complete evacuation.  Baseline:  Goal status: GOAL MET  LONG TERM GOALS: Target date: 09/26/2024  Pt will be independent with advanced HEP.  Baseline:  Goal status: GOAL MET  2.  Pt to demonstrate improved coordination of pelvic floor and breathing mechanics with 10# squat with appropriate synergistic patterns to decrease pain and leakage at least 75% of the time for improved ability to complete a 30 minute workout with strain at pelvic floor and  symptoms.   Baseline:  Goal status: GOAL MET  3.  Pt will report 75% reduction of pain due to improvements in posture, strength, and muscle length in pelvic floor musculature to decrease painful spasms while working.  Baseline:  Goal status: GOAL MET PHYSICAL THERAPY DISCHARGE SUMMARY  Visits from Start of Care: 4  Current functional level related to goals / functional outcomes: See above   Remaining deficits: See above   Education / Equipment: See above   Patient agrees to discharge. Patient goals were met. Patient is being discharged due to being pleased with the current functional level.   Celena JAYSON Domino, PT 04/26/2024, 1:36 PM

## 2024-05-02 ENCOUNTER — Encounter: Payer: Self-pay | Admitting: Physical Therapy

## 2024-05-12 ENCOUNTER — Ambulatory Visit (INDEPENDENT_AMBULATORY_CARE_PROVIDER_SITE_OTHER): Admitting: Obstetrics and Gynecology

## 2024-05-12 VITALS — BP 99/67 | HR 75 | Ht 67.0 in | Wt 143.0 lb

## 2024-05-12 DIAGNOSIS — G8929 Other chronic pain: Secondary | ICD-10-CM

## 2024-05-12 DIAGNOSIS — N946 Dysmenorrhea, unspecified: Secondary | ICD-10-CM | POA: Diagnosis not present

## 2024-05-12 DIAGNOSIS — R102 Pelvic and perineal pain unspecified side: Secondary | ICD-10-CM

## 2024-05-12 DIAGNOSIS — N939 Abnormal uterine and vaginal bleeding, unspecified: Secondary | ICD-10-CM

## 2024-05-12 NOTE — Progress Notes (Unsigned)
 GYNECOLOGY VISIT  Patient name: Frances Cohen MRN [id number]  Date of birth: [birth year] Chief Complaint:   Follow-up  History:  Has graduated from PFPT, advanced quickly. Feels much better. Able to excise much.  Stopped the birth control - felt it was making her feel crazy and stopped it. 4 days later and it was a normal period. Heavy day 2 but that is her typical, 7 days (5 real days, and some spotting). The end of September cycle. No pain with stairs at work; has not been doing as much at work. Switched to flexeril  and not using at all now or aleve .     The following portions of the patient's history were reviewed and updated as appropriate: allergies, current medications, past family history, past medical history, past social history, past surgical history and problem list.   Health Maintenance:   Last pap     Component Value Date/Time   DIAGPAP  02/02/2024 1512    - Negative for intraepithelial lesion or malignancy (NILM)   DIAGPAP  09/09/2020 1411    - Negative for intraepithelial lesion or malignancy (NILM)   HPVHIGH Negative 02/02/2024 1512   HPVHIGH Negative 09/09/2020 1411   ADEQPAP  02/02/2024 1512    Satisfactory for evaluation; transformation zone component ABSENT.   ADEQPAP  09/09/2020 1411    Satisfactory for evaluation; transformation zone component PRESENT.    Health Maintenance  Topic Date Due   Hepatitis B Vaccine (1 of 3 - 19+ 3-dose series) Never done   HPV Vaccine (1 - 3-dose SCDM series) Never done   Flu Shot  Never done   COVID-19 Vaccine (1 - 2025-26 season) Never done   DTaP/Tdap/Td vaccine (1 - Tdap) 07/11/2024*   Breast Cancer Screening  02/28/2026   Pap with HPV screening  02/01/2029   Hepatitis C Screening  Completed   HIV Screening  Completed   Pneumococcal Vaccine  Aged Out   Meningitis B Vaccine  Aged Out  *Topic was postponed. The date shown is not the original due date.      Review of Systems:  Pertinent items are noted in  HPI. Comprehensive review of systems was otherwise negative.   Objective:  Physical Exam BP 99/67   Pulse 75   Ht 5' 7 (1.702 m)   Wt 143 lb (64.9 kg)   LMP 04/23/2024   BMI 22.40 kg/m    Physical Exam Vitals and nursing note reviewed.  Constitutional:      Appearance: Normal appearance.  HENT:     Head: Normocephalic and atraumatic.  Pulmonary:     Effort: Pulmonary effort is normal.     Breath sounds: Normal breath sounds.  Abdominal:     Tenderness: There is no abdominal tenderness.  Genitourinary:    Vagina: Normal.     Cervix: Normal.  Skin:    General: Skin is warm and dry.  Neurological:     General: No focal deficit present.     Mental Status: She is alert.  Psychiatric:        Mood and Affect: Mood normal.        Behavior: Behavior normal.        Thought Content: Thought content normal.        Judgment: Judgment normal.        Assessment & Plan:  1. Chronic pelvic pain in female (Primary) Significantly improved with PFPT and no longer requiring medication. Will continue to monitor symptoms as activity increases.  - US   PELVIC COMPLETE WITH TRANSVAGINAL; Future  2. Abnormal uterine bleeding (AUB) 3. Dysmenorrhea Menses have returned to baseline. Will repeat ultrasound to reassess ovarian cyst and lining given prior presentation of heavy bleeding and cysts. Reviewed that if unchanged and asymptomatic, no intervention needed.     Carter Quarry, MD [facility], [facility]

## 2024-05-12 NOTE — Progress Notes (Unsigned)
 Pt is in office for f/u pelvic pain. Pt states she is no longer having pain as before. Pt has stopped taking birth control.

## 2024-05-16 ENCOUNTER — Encounter: Admitting: Physical Therapy

## 2024-05-25 ENCOUNTER — Encounter: Admitting: Physical Therapy

## 2024-05-30 ENCOUNTER — Encounter: Admitting: Physical Therapy

## 2024-06-01 ENCOUNTER — Encounter: Admitting: Physical Therapy

## 2024-06-19 ENCOUNTER — Encounter: Payer: Self-pay | Admitting: Obstetrics and Gynecology

## 2024-08-31 ENCOUNTER — Other Ambulatory Visit: Payer: Self-pay | Admitting: Obstetrics and Gynecology

## 2024-08-31 NOTE — Addendum Note (Signed)
 Addended by: Deandre Stansel, SOLA O on: 08/31/2024 10:33 AM   Modules accepted: Orders

## 2024-09-04 ENCOUNTER — Ambulatory Visit (HOSPITAL_COMMUNITY): Payer: Self-pay

## 2024-10-02 ENCOUNTER — Other Ambulatory Visit (HOSPITAL_COMMUNITY): Payer: Self-pay
